# Patient Record
Sex: Female | Born: 1960 | Race: Black or African American | Hispanic: No | Marital: Single | Smoking: Never smoker
Health system: Southern US, Community
[De-identification: ages and names within clinical notes are randomized; demographics above are authoritative.]

## PROBLEM LIST (undated history)

## (undated) DIAGNOSIS — C801 Malignant (primary) neoplasm, unspecified: Secondary | ICD-10-CM

## (undated) DIAGNOSIS — Z9289 Personal history of other medical treatment: Secondary | ICD-10-CM

## (undated) DIAGNOSIS — C8590 Non-Hodgkin lymphoma, unspecified, unspecified site: Secondary | ICD-10-CM

## (undated) DIAGNOSIS — D649 Anemia, unspecified: Secondary | ICD-10-CM

## (undated) DIAGNOSIS — E559 Vitamin D deficiency, unspecified: Secondary | ICD-10-CM

## (undated) DIAGNOSIS — Q6102 Congenital multiple renal cysts: Secondary | ICD-10-CM

## (undated) DIAGNOSIS — E278 Other specified disorders of adrenal gland: Secondary | ICD-10-CM

## (undated) DIAGNOSIS — R42 Dizziness and giddiness: Secondary | ICD-10-CM

## (undated) DIAGNOSIS — Z79899 Other long term (current) drug therapy: Secondary | ICD-10-CM

## (undated) DIAGNOSIS — R0600 Dyspnea, unspecified: Secondary | ICD-10-CM

## (undated) DIAGNOSIS — C859A Non-Hodgkin lymphoma, unspecified, in remission: Secondary | ICD-10-CM

## (undated) DIAGNOSIS — C9100 Acute lymphoblastic leukemia not having achieved remission: Secondary | ICD-10-CM

## (undated) DIAGNOSIS — E213 Hyperparathyroidism, unspecified: Secondary | ICD-10-CM

## (undated) DIAGNOSIS — E042 Nontoxic multinodular goiter: Secondary | ICD-10-CM

## (undated) DIAGNOSIS — N186 End stage renal disease: Secondary | ICD-10-CM

## (undated) DIAGNOSIS — H9190 Unspecified hearing loss, unspecified ear: Secondary | ICD-10-CM

## (undated) DIAGNOSIS — E119 Type 2 diabetes mellitus without complications: Secondary | ICD-10-CM

## (undated) DIAGNOSIS — R7303 Prediabetes: Secondary | ICD-10-CM

## (undated) DIAGNOSIS — I10 Essential (primary) hypertension: Secondary | ICD-10-CM

## (undated) DIAGNOSIS — I509 Heart failure, unspecified: Secondary | ICD-10-CM

## (undated) DIAGNOSIS — N183 Chronic kidney disease, stage 3 unspecified: Secondary | ICD-10-CM

## (undated) DIAGNOSIS — E279 Disorder of adrenal gland, unspecified: Secondary | ICD-10-CM

## (undated) DIAGNOSIS — N200 Calculus of kidney: Secondary | ICD-10-CM

## (undated) DIAGNOSIS — Z7969 Long term (current) use of other immunomodulators and immunosuppressants: Secondary | ICD-10-CM

## (undated) HISTORY — PX: PARATHYROIDECTOMY: SHX19

## (undated) HISTORY — PX: ABDOMINAL HYSTERECTOMY: SHX81

## (undated) HISTORY — DX: Disorder of adrenal gland, unspecified: E27.9

## (undated) HISTORY — DX: Vitamin D deficiency, unspecified: E55.9

## (undated) HISTORY — PX: TUBAL LIGATION: SHX77

## (undated) HISTORY — PX: BREAST BIOPSY: SHX20

## (undated) HISTORY — DX: Nontoxic multinodular goiter: E04.2

## (undated) HISTORY — DX: Type 2 diabetes mellitus without complications: E11.9

## (undated) HISTORY — DX: Unspecified hearing loss, unspecified ear: H91.90

## (undated) HISTORY — DX: Calculus of kidney: N20.0

## (undated) HISTORY — DX: Dizziness and giddiness: R42

## (undated) HISTORY — DX: Congenital multiple renal cysts: Q61.02

## (undated) HISTORY — DX: Other specified disorders of adrenal gland: E27.8

## (undated) HISTORY — DX: Hyperparathyroidism, unspecified: E21.3

## (undated) HISTORY — PX: OTHER SURGICAL HISTORY: SHX169

## (undated) HISTORY — DX: Chronic kidney disease, stage 3 unspecified: N18.30

## (undated) NOTE — *Deleted (*Deleted)
Marland Kitchen    HEMATOLOGY/ONCOLOGY CLINIC NOTE  Date of Service: 01/07/2020  Patient Care Team: Bernerd Limbo, MD as PCP - General (Family Medicine)  CHIEF COMPLAINTS/PURPOSE OF CONSULTATION:  f/u for Diffuse large B-cell lymphoma.   HISTORY OF PRESENTING ILLNESS:   Tracey Stevens is a wonderful 47 y.o. 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  Patient has a history of cervical cancer diagnosed in 2003 in [place] treated with total abdominal hysterectomy. Has been in remission since. He did not require any chemoradiation. Bilateral ovaries intact per patient.  She presented to Dr. Alferd Patee MD at [facility] in October 2014 with left-sided abdominal and chest pain and was found to have abnormal liver function tests. Subsequent CT chest abdomen pelvis showed large hepatic lesions. Biopsy of one of the liver lesions in the right lobe of the liver on 12/05/2012 showed diffuse large B-cell lymphoma with a Ki-67 of more than 95%. Bone marrow biopsy was done on 12/29/2012 and was noted to be negative for lymphoma. Patient subsequently had a PET/CT scan which showed lymphoma involving the liver, spleen, mesenteric lymph nodes as well as a T12 fracture for which she was given a back brace.  Patient has been treated with 6 cycles of R CHOP for suspected double hit DL BCL and received 4 intrathecal doses of methotrexate for CNS prophylaxis. PET/CT scan done on 08/31/2013 showed no evidence of residual lymphoma. She was noted to have bilateral nodular densities in bilateral lower lungs which were thought to be due to inflammation/infection and she was empirically treated with levofloxacin for one week.  She has had issues with back pain intermittently and significant renal stones. She notes that she had her parathyroids removed for hyperparathyroidism.  She subsequently moved from [facility] to  [place] and was seen on 08/05/2015 by Dr. Marcine Matar at the [facility] in [place] and had labs including a normal LDH of 135, normal CBC and normal chemistries  on 08/05/2015. She subsequently had a CT chest abdomen pelvis on 08/16/2015 that showed no evidence of abnormal lymphadenopathy within the chest abdomen or pelvis.  Noted to have an indeterminate 1.5 cm left adrenal gland nodule. Severe remote compression deformity of T12. She was noted to have left sided nephrolithiasis and simple appearing bilateral renal cysts for which she has been validated by Dr. Festus Aloe at [facility] urology.  Patient notes no fevers no chills no drenching night sweats. No weight loss. No other specific new focal symptoms at this time.  Notes that she has had issues with chronic back pains for which she has been using a brace. She has been validated by neurosurgery Dr. Sherwood Gambler and a decision has been made against surgery at this time. Has been using naproxen for her back pain.  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  INTERVAL HISTORY: Tracey Stevens is here for her scheduled 12 month follow-up for diffuse large B-cell lymphoma. The patient's last visit with [place] was on 01/10/2019. The pt reports that she is doing well overall.  The pt reports ***  Of note since the patient's last visit, pt has had *** completed on *** with results revealing ***.  Lab results today (01/07/20) of CBC w/diff and CMP is as  follows: all values are WNL except for ***. 01/10/2020 LDH at***  On review of systems, pt reports *** and denies ***and any other symptoms.   A&P: -Discussed pt labwork today, 01/07/20; *** -***  MEDICAL HISTORY:  Past Medical History:  Diagnosis Date  . Cancer (HCC)    cervical  . Hypertension   . 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  Vertigo-likely due to either etiology Adrenal nodule Renal cysts Nephrolithiasis Hypertension T12 compression fracture Multinodular goiter Primary hyperparathyroidism status post parathyroidectomy Vitamin D deficiency Left-sided conductive hearing loss   SURGICAL HISTORY: Past Surgical History:  Procedure Laterality Date  . ABDOMINAL HYSTERECTOMY    . BREAST BIOPSY Right over 10 years ago   benign  Biopsy of liver lesion noted to be diffuse large B-cell lymphoma in 2014. Parathyroidectomy-subtotal History of left ear surgery History of chemotherapy  SOCIAL HISTORY: Social History   Socioeconomic History  . Marital status: Single    Spouse name: Not on file  . Number of children: 4  . Years of education: Not on file  . Highest education level: 12th grade  Occupational History  . Occupation: disabled  Tobacco Use  . Smoking status: Never Smoker  . Smokeless tobacco: Never Used  Vaping Use  . Vaping Use: Never used  Substance and Sexual Activity  . Alcohol use: No  . Drug use: No  . Sexual activity: Not on file  Other Topics Concern  . Not on file  Social History Narrative   Pt is right handed. She is single, lives alone in a 2nd floor apartment. She drinks 2-3 glasses of tea a day. She is active.   Social Determinants of Health   Financial Resource Strain:   . Difficulty of Paying Living Expenses: Not on file  Food Insecurity:   . Worried About Charity fundraiser in the Last Year: Not on file  . Ran Out of Food in the Last Year: Not on file  Transportation Needs:   . Lack of Transportation (Medical): Not on file  . Lack of Transportation (Non-Medical): Not on file  Physical Activity:   . Days of Exercise per Week: Not on file  . Minutes of Exercise per Session: Not on file  Stress:   .  Feeling of Stress : Not on file  Social Connections:   . Frequency of Communication with Friends and Family: Not on file  . Frequency of Social Gatherings with Friends and Family: Not on file  . Attends Religious Services: Not on file  . Active Member of Clubs or Organizations: Not on file  . Attends Archivist Meetings: Not on file  . Marital Status: Not on file  Intimate Partner Violence:   . Fear of Current or Ex-Partner: Not on file  . Emotionally Abused: Not on file  . Physically Abused: Not on file  . Sexually Abused: Not on file  Moved from [place] to [facility] about one year ago  FAMILY HISTORY:  Family history of coronary artery disease -mother  Hypertension-mother -Gastric cancer-father Renal failure father   ALLERGIES:  has No Known Allergies.  MEDICATIONS:  Current Outpatient Medications  Medication Sig Dispense Refill  . alendronate (FOSAMAX) 70 MG tablet Take 70 mg by mouth once a week. Take with a full glass of water  on an empty stomach.    Marland Kitchen atorvastatin (LIPITOR) 20 MG tablet Take 20 mg by mouth daily.    Marland Kitchen azelastine (ASTELIN) 0.1 % nasal spray Place 1 spray into both nostrils 2 (two) times daily. Use in each nostril as directed    . cholecalciferol (VITAMIN D) 1000 units tablet Take 4,000 Units by mouth daily.     . furosemide (LASIX) 20 MG tablet Take 20 mg by mouth 2 (two) times daily.    Marland Kitchen gabapentin (NEURONTIN) 300 MG capsule Take 300 mg by mouth 2 (two) times daily.    Marland Kitchen lisinopril (PRINIVIL,ZESTRIL) 10 MG tablet Take 10 mg by mouth daily.    Marland Kitchen losartan (COZAAR) 25 MG tablet Take by mouth.    . meclizine (ANTIVERT) 25 MG tablet Take 25 mg by mouth 2 (two) times daily as needed for dizziness.     No current facility-administered medications for this visit.    REVIEW OF SYSTEMS:   A 10+ POINT REVIEW OF SYSTEMS WAS OBTAINED including neurology, dermatology, psychiatry, cardiac, respiratory, lymph, extremities, GI,  GU, Musculoskeletal, constitutional, breasts, reproductive, HEENT.  All pertinent positives are noted in the HPI.  All others are negative.   PHYSICAL EXAMINATION: ECOG PERFORMANCE STATUS: 1 - Symptomatic but completely ambulatory  . There were no vitals filed for this visit. There were no vitals filed for this visit. .There is no height or weight on file to calculate BMI.  *** GENERAL:alert, in no acute distress and comfortable SKIN: no acute rashes, no significant lesions EYES: conjunctiva are pink and non-injected, sclera anicteric OROPHARYNX: MMM, no exudates, no oropharyngeal erythema or ulceration NECK: supple, no JVD LYMPH:  no palpable lymphadenopathy in the cervical, axillary or inguinal regions LUNGS: clear to auscultation b/l with normal respiratory effort HEART: regular rate & rhythm ABDOMEN:  normoactive bowel sounds , non tender, not distended. No palpable hepatosplenomegaly.  Extremity: no pedal edema PSYCH: alert & oriented x 3 with fluent speech NEURO: no focal motor/sensory deficits  LABORATORY DATA:  I have reviewed the data as listed  . CBC Latest Ref Rng & Units 01/10/2019 01/11/2018 07/13/2017  WBC 4.0 - 10.5 K/uL 4.8 5.4 3.8(L)  Hemoglobin 12.0 - 15.0 g/dL 12.5 12.7 13.2  Hematocrit 36 - 46 % 38.1 37.7 38.8  Platelets 150 - 400 K/uL 308 264 279   . CBC    Component Value Date/Time   WBC 4.8 01/10/2019 1049   RBC 4.14 01/10/2019 1049   HGB 12.5 01/10/2019 1049   HGB 12.7 01/11/2018 1304   HGB 13.1 01/05/2017 1047   HCT 38.1 01/10/2019 1049   HCT 38.9 01/05/2017 1047   PLT 308 01/10/2019 1049   PLT 264 01/11/2018 1304   PLT 279 01/05/2017 1047   MCV 92.0 01/10/2019 1049   MCV 91.1 01/05/2017 1047   MCH 30.2 01/10/2019 1049   MCHC 32.8 01/10/2019 1049   RDW 12.6 01/10/2019 1049   RDW 13.3 01/05/2017 1047   LYMPHSABS 1.9 01/10/2019 1049   LYMPHSABS 1.7 01/05/2017 1047   MONOABS 0.5 01/10/2019 1049   MONOABS 0.4 01/05/2017 1047   EOSABS 0.2  01/10/2019 1049   EOSABS 0.2 01/05/2017 1047   BASOSABS 0.0 01/10/2019 1049   BASOSABS 0.0 01/05/2017 1047    . CMP Latest Ref Rng & Units 01/10/2019 01/11/2018 07/13/2017  Glucose 70 - 99 mg/dL 114(H) 90 82  BUN 6 - 20 mg/dL 19 16 18   Creatinine 0.44 - 1.00 mg/dL 2.03(H) 1.56(H) 1.44(H)  Sodium 135 - 145 mmol/L  144 146(H) 144  Potassium 3.5 - 5.1 mmol/L 4.2 4.1 3.9  Chloride 98 - 111 mmol/L 106 106 107  CO2 22 - 32 mmol/L 30 31 29   Calcium 8.9 - 10.3 mg/dL 10.2 10.2 10.4  Total Protein 6.5 - 8.1 g/dL 7.1 7.2 7.1  Total Bilirubin 0.3 - 1.2 mg/dL 0.5 0.5 0.6  Alkaline Phos 38 - 126 U/L 72 61 49  AST 15 - 41 U/L 17 15 15   ALT 0 - 44 U/L 7 12 9    . Lab Results  Component Value Date   LDH 161 01/10/2019       CT Chest Abdomen Pelvis W Contrast6/04/2015 [facility] Result Impression  IMPRESSION: 1.No evidence of abnormal lymphadenopathy within the chest, abdomen, or pelvis.  2.Indeterminate 1.5 cm left adrenal gland nodule.  3.Severe remote compression deformity of T12 which may reflect remote history of lymphoma.  4.Hepatic cyst  5. Simple appearing bilateral renal cysts. Left-sided nephrolithiasis.  6.Small hiatal hernia.  Result Narrative  INDICATION: C83.30: Diffuse large B-cell lymphoma, unspecified site.  COMPARISON:None available.  TECHNIQUE:CT CHEST ABDOMEN PELVIS W CONTRAST -100 mL Isovue 370 were administered intravenously. Dose reduction was utilized (automated exposure control, mA or kV adjustment based on patient size, or iterative image reconstruction).  FINDINGS:   SUPPORT APPARATUS: N.A.   LUNGS/PLEURA: No focal airspace opacities/consolidations. No pneumothorax.  No abnormal pulmonary masses.  No pleural effusions.  HEART/MEDIASTINUM: Cardiac size is normal. No acute thoracic aortic abnormalities.  No hilar, mediastinal, or axillary lymphadenopathy.  SOLID ABDOMINAL VISCERA: Liver: Small hepatic cysts  measures 1.8 cm.. Gallbladder: Contracted Pancreas: Unremarkable. Adrenal glands: Indeterminate left gland nodule measuring 1.3 x 1.5 cm. Normal right adrenal gland. Spleen: Unremarkable. Kidneys: Multiple nonobstructing calculi within the left lower pole renal calyx largest measuring 8 mm. No hydronephrosis or perinephric stranding bilaterally. There are multiple bilateral renal cysts larger of which are seen on the left measuring up to  6.5 cm in size. There are smaller right renal cysts largest 1.2 cm.  GI: No bowel obstruction. Small hiatal hernia. No focal bowel wall thickening or inflammatory changes. Normal appendix.   PERITONEAL CAVITY/RETROPERITONEUM: No free fluid. No pneumoperitoneum. No lymphadenopathy.  PELVIS: No acute abnormalities.  MUSCULOSKELETAL: No acute or destructive osseous processes.  MISC./CHRONIC FINDINGS: Atherosclerotic calcifications are present, particularly involving the visualized aorta. Chronic/degenerative changes at L5-S1. Remote severe compression deformity of T12.   RADIOGRAPHIC STUDIES: I have personally reviewed the radiological images as listed and agreed with the findings in the report. No results found.   MRI Brain 05/20/17 at [facility]: #Skull/marrow/soft tissues: No calvarial or skull base lesion. #Orbits: Unremarkable. #Sinuses: Unremarkable. #Brain: Overall parenchymal volume appropriate for age. No significant signal abnormality appreciated within the brain parenchyma. In particular, no mass, hemorrhage or infarction. No significant white matter disease. Mild expanded empty sella. No  midline shift, hydrocephalus or extra-axial collections. Flow is demonstrated within the major vessels at the base of the brain.  MRI Spine Lumbar 03/18/17 at [facility]:  1. Old compression fracture at T12 2. Severe left neuroforaminal stenosis and moderate to severe right neuroforaminal stenosis at L5-S1 due  to disc bulge and facet arthritis  3. Otherwise mild degenerative changes  ASSESSMENT & PLAN:   37 y.o. African-American female with  #1 history of stage IV diffuse large B-cell lymphoma possibly double hit. Diagnosed October 2014. Patient was treated with R CHOP 6 cycles and CNS prophylaxis with intrathecal methotrexate 4 doses. She is now about 4  years out and has been in complete remission. She has no constitutional symptoms at this time or palpable lymphadenopathy or hepatosplenomegaly or other focal symptoms to suggest lymphoma recurrence at this time. Her last CT chest abdomen pelvis on 08/16/2015 were negative for evidence of lymphoma.  PLAN:  ***  #2 left-sided nephrolithiasis with renal cysts-being followed by Festus Aloe with the [facility] urology. -kidney stone is stable, her urologist is considering Cystoscopy   #3 chronic back pains due to degenerative disc disease and compression fracture at T12 from [place] with neurosurgery Dr. Sherwood Gambler. -She is now under pain management. Her naproxen was recently changed to Meloxicam in 06/2017.  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  -Seen on 05/20/17 brain MRI  -Continue to be followed by neurologist.   FOLLOW UP: ***   The total time spent in the appt was *** minutes and more than 50% was on counseling and direct patient cares.  All of the patient's questions were answered with apparent satisfaction. The patient knows to call the clinic with any problems, questions or concerns.    Sullivan Lone MD [place] AAHIVMS [facility] Hematology/Oncology Physician [facility]  (Office):       [phone] (Work cell):  [phone] (Fax):           [phone]  01/07/2020 7:01 PM  I, Yevette Edwards, am acting as a scribe for Dr. Sullivan Lone.   {Add Buyer, retail Statement}

---

## 2012-03-16 HISTORY — PX: LIVER BIOPSY: SHX301

## 2015-06-09 ENCOUNTER — Encounter (HOSPITAL_COMMUNITY): Payer: Self-pay | Admitting: *Deleted

## 2015-06-09 ENCOUNTER — Emergency Department (HOSPITAL_COMMUNITY)
Admission: EM | Admit: 2015-06-09 | Discharge: 2015-06-09 | Disposition: A | Discharge status: Home / Self Care | Payer: Medicaid Other | Attending: Emergency Medicine | Admitting: Emergency Medicine

## 2015-06-09 DIAGNOSIS — Z8541 Personal history of malignant neoplasm of cervix uteri: Secondary | ICD-10-CM | POA: Insufficient documentation

## 2015-06-09 DIAGNOSIS — I10 Essential (primary) hypertension: Secondary | ICD-10-CM | POA: Diagnosis not present

## 2015-06-09 DIAGNOSIS — C8590 Non-Hodgkin lymphoma, unspecified, unspecified site: Secondary | ICD-10-CM | POA: Insufficient documentation

## 2015-06-09 DIAGNOSIS — K029 Dental caries, unspecified: Secondary | ICD-10-CM | POA: Diagnosis not present

## 2015-06-09 DIAGNOSIS — K0889 Other specified disorders of teeth and supporting structures: Secondary | ICD-10-CM | POA: Insufficient documentation

## 2015-06-09 HISTORY — DX: Non-Hodgkin lymphoma, unspecified, unspecified site: C85.90

## 2015-06-09 HISTORY — DX: Non-Hodgkin lymphoma, unspecified, in remission: C85.9A

## 2015-06-09 HISTORY — DX: Malignant (primary) neoplasm, unspecified: C80.1

## 2015-06-09 HISTORY — DX: Essential (primary) hypertension: I10

## 2015-06-09 MED ORDER — PENICILLIN V POTASSIUM 250 MG PO TABS
500.0000 mg | ORAL_TABLET | Freq: Once | ORAL | Status: AC
  Administered 2015-06-09: 500 mg via ORAL
  Filled 2015-06-09: qty 2

## 2015-06-09 MED ORDER — BUPIVACAINE-EPINEPHRINE (PF) 0.5% -1:200000 IJ SOLN
1.8000 mL | Freq: Once | INTRAMUSCULAR | Status: AC
  Administered 2015-06-09: 1.8 mL

## 2015-06-09 MED ORDER — LIDOCAINE VISCOUS 2 % MT SOLN
15.0000 mL | Freq: Once | OROMUCOSAL | Status: AC
  Administered 2015-06-09: 15 mL via OROMUCOSAL
  Filled 2015-06-09: qty 15

## 2015-06-09 MED ORDER — OXYCODONE-ACETAMINOPHEN 5-325 MG PO TABS: ORAL_TABLET | ORAL | Status: DC

## 2015-06-09 MED ORDER — BUPIVACAINE-EPINEPHRINE (PF) 0.5% -1:200000 IJ SOLN
1.8000 mL | Freq: Once | INTRAMUSCULAR | Status: AC
  Administered 2015-06-09: 1.8 mL
  Filled 2015-06-09: qty 1.8

## 2015-06-09 MED ORDER — PENICILLIN V POTASSIUM 500 MG PO TABS: 500.0000 mg | ORAL_TABLET | Freq: Four times a day (QID) | ORAL | Status: AC

## 2015-06-09 MED ORDER — OXYCODONE-ACETAMINOPHEN 5-325 MG PO TABS
2.0000 | ORAL_TABLET | Freq: Once | ORAL | Status: AC
  Administered 2015-06-09: 2 via ORAL
  Filled 2015-06-09: qty 2

## 2015-06-09 MED ORDER — ONDANSETRON 4 MG PO TBDP
4.0000 mg | ORAL_TABLET | Freq: Once | ORAL | Status: AC
  Administered 2015-06-09: 4 mg via ORAL
  Filled 2015-06-09: qty 1

## 2015-06-09 MED ORDER — IBUPROFEN 800 MG PO TABS: 800.0000 mg | ORAL_TABLET | Freq: Three times a day (TID) | ORAL | Status: DC

## 2015-06-09 NOTE — Discharge Instructions (Signed)

## 2015-06-09 NOTE — ED Notes (Signed)
Declined W/C at D/C and was escorted to lobby by RN. 

## 2015-06-09 NOTE — ED Notes (Signed)
PY reports dental pain . Pt reports taking oxycodone ,advil and tylenol with no relief of pain.

## 2015-06-09 NOTE — ED Provider Notes (Signed)
CSN: [id number]     Arrival date & time 06/09/15  1646 History  By signing my name below, I, Tracey Stevens, attest that this documentation has been prepared under the direction and in the presence of Delsa Grana, PA-C. Electronically Signed: Randa Stevens, ED Scribe. 06/09/2015. 5:09 PM.     Chief Complaint  Patient presents with  . Dental Pain   Patient is a 55 y.o. female presenting with tooth pain. The history is provided by the patient. No language interpreter was used.  Dental Pain Associated symptoms: facial swelling   Associated symptoms: no fever    HPI Comments: Tracey Stevens is a 55 y.o. female who presents to the Emergency Department complaining of 10/10 lower right sided dental pain onset 3 days prior. Pt states that she believes that her lower right molar has a cavity in it and may be developing an abscess. Pain is radiating to the right side of her face associated with facial swelling.  No redness. Pt states she has tried Oxycodone 5-325 and motrin with no relief. Denies fever, nausea, vomiting or trouble swallowing. Pt reports Hx of lymphoma that has been in remission since 2015.  She has pain medication for chronic back pain but takes 1 to 2 at most per day.  Her last dose was early this morning.  Past Medical History  Diagnosis Date  . Cancer ([place])     cervical  . Lymphoma in remission ([place]) 2015 remission  . Hypertension    Past Surgical History  Procedure Laterality Date  . Abdominal hysterectomy     History reviewed. No pertinent family history. Social History  Substance Use Topics  . Smoking status: Never Smoker   . Smokeless tobacco: Never Used  . Alcohol Use: No   OB History    No data available     Review of Systems  Constitutional: Negative for fever.  HENT: Positive for dental problem and facial swelling. Negative for trouble swallowing.   Gastrointestinal: Negative for nausea and vomiting.  All other systems reviewed and are  negative.     Allergies  Review of patient's allergies indicates no known allergies.  Home Medications   Prior to Admission medications   Medication Sig Start Date End Date Taking? Authorizing Provider  ibuprofen (ADVIL,MOTRIN) 800 MG tablet Take 1 tablet (800 mg total) by mouth 3 (three) times daily. 06/09/15   Delsa Grana, PA-C  oxyCODONE-acetaminophen (PERCOCET/ROXICET) 5-325 MG tablet Take 1 to 2 tabs by mouth every six hours as needed for pain. 06/09/15   Delsa Grana, PA-C  penicillin v potassium (VEETID) 500 MG tablet Take 1 tablet (500 mg total) by mouth 4 (four) times daily. 06/09/15 06/16/15  Delsa Grana, PA-C   BP 147/97 mmHg  Pulse 98  Temp(Src) 99.1 F (37.3 C) (Oral)  Resp 20  Ht 6\' 1"  (1.854 m)  Wt 104.327 kg  BMI 30.35 kg/m2  SpO2 98%   Physical Exam  Constitutional: She is oriented to person, place, and time. She appears well-developed and well-nourished. She is cooperative.  Non-toxic appearance. She does not have a sickly appearance. She appears distressed.  Non-toxic appearing female, appears very uncomfortable, unable to hold still, holding right side of face, rocking back and forth and moaning  HENT:  Head: Normocephalic and atraumatic.  Right Ear: Tympanic membrane and external ear normal.  Left Ear: Tympanic membrane and external ear normal.  Nose: Nose normal. Right sinus exhibits no maxillary sinus tenderness and no frontal sinus tenderness. Left sinus exhibits no  maxillary sinus tenderness and no frontal sinus tenderness.  Mouth/Throat: Uvula is midline, oropharynx is clear and moist and mucous membranes are normal. She has dentures. No trismus in the jaw. Abnormal dentition. Dental caries present. No dental abscesses or uvula swelling.    No sublingual tenderness  Eyes: Conjunctivae and EOM are normal.  Neck: Neck supple.  Cardiovascular: Normal rate.   Pulmonary/Chest: Effort normal. No respiratory distress.  Musculoskeletal: Normal range of motion.   Neurological: She is alert and oriented to person, place, and time.  Skin: Skin is warm and dry. She is not diaphoretic.  Psychiatric: She has a normal mood and affect. Her behavior is normal.  Nursing note and vitals reviewed.   ED Course  Procedures (including critical care time)  NERVE BLOCK Date/Time: 06/09/2015, 1800 Performed by: Dala Dock Authorized by: Delsa Grana R Consent: Verbal consent obtained. Risks and benefits: risks, benefits and alternatives were discussed Consent given by: patient Indications: pain relief Body area: tooth # 32 Laterality:right lower posterior molar Needle gauge: 27 G Topical anesthetic:  Viscous lidocaine Local anesthetic: bupivacaine  Anesthetic total: 1.8 ml x 2 Outcome: pain improved Patient tolerance: Patient tolerated the procedure well with no immediate complications. Comments: Inferior alveolar nerve block gave partial relief to pt, decreasing pain from 10/10 to 7/10.  Right inferior buccal block gave complete relief of pain.     Dental Soft Tissue Exam  DIAGNOSTIC STUDIES: Oxygen Saturation is 99% on RA, normal by my interpretation.    COORDINATION OF CARE: 5:07 PM-Discussed treatment plan with pt at bedside and pt agreed to plan.     Labs Review Labs Reviewed - No data to display  Imaging Review No results found.    EKG Interpretation None      MDM   Patient with toothache.  No gross abscess.  Exam unconcerning for Ludwig's angina or spread of infection.  Will treat with penicillin and pain medicine.  Dental/nerve block performed due to pt's severe pain and current daily use of percocet.  Urged patient to follow-up with dentist.    Pt d/c in good condition.  Return precautions reviewed.  VSS  Final diagnoses:  Pain, dental    I personally performed the services described in this documentation, which was scribed in my presence. The recorded information has been reviewed and is accurate.       Delsa Grana, PA-C 06/15/15 [place], MD 06/20/15 [phone]

## 2015-07-12 ENCOUNTER — Encounter (HOSPITAL_COMMUNITY): Payer: Self-pay | Admitting: Emergency Medicine

## 2015-07-12 ENCOUNTER — Emergency Department (HOSPITAL_COMMUNITY)
Admission: EM | Admit: 2015-07-12 | Discharge: 2015-07-12 | Disposition: A | Discharge status: Home / Self Care | Payer: Medicaid Other | Attending: Emergency Medicine | Admitting: Emergency Medicine

## 2015-07-12 DIAGNOSIS — K029 Dental caries, unspecified: Secondary | ICD-10-CM | POA: Diagnosis not present

## 2015-07-12 DIAGNOSIS — I10 Essential (primary) hypertension: Secondary | ICD-10-CM | POA: Diagnosis not present

## 2015-07-12 DIAGNOSIS — Z791 Long term (current) use of non-steroidal anti-inflammatories (NSAID): Secondary | ICD-10-CM | POA: Diagnosis not present

## 2015-07-12 DIAGNOSIS — Z8572 Personal history of non-Hodgkin lymphomas: Secondary | ICD-10-CM | POA: Diagnosis not present

## 2015-07-12 DIAGNOSIS — Z8541 Personal history of malignant neoplasm of cervix uteri: Secondary | ICD-10-CM | POA: Insufficient documentation

## 2015-07-12 DIAGNOSIS — K0889 Other specified disorders of teeth and supporting structures: Secondary | ICD-10-CM | POA: Diagnosis present

## 2015-07-12 MED ORDER — AMOXICILLIN-POT CLAVULANATE 875-125 MG PO TABS
1.0000 | ORAL_TABLET | Freq: Once | ORAL | Status: AC
  Administered 2015-07-12: 1 via ORAL
  Filled 2015-07-12: qty 1

## 2015-07-12 MED ORDER — IBUPROFEN 800 MG PO TABS: 800.0000 mg | ORAL_TABLET | Freq: Three times a day (TID) | ORAL | Status: DC

## 2015-07-12 MED ORDER — KETOROLAC TROMETHAMINE 60 MG/2ML IM SOLN
30.0000 mg | Freq: Once | INTRAMUSCULAR | Status: AC
  Administered 2015-07-12: 30 mg via INTRAMUSCULAR
  Filled 2015-07-12: qty 2

## 2015-07-12 MED ORDER — AMOXICILLIN-POT CLAVULANATE 875-125 MG PO TABS: 1.0000 | ORAL_TABLET | Freq: Two times a day (BID) | ORAL | Status: DC

## 2015-07-12 NOTE — ED Notes (Signed)
Pt. States she has right sided dental pain x 1 month on and off. Pt. States she was seen for same dental pain 2 weeks ago and pain went away for a while. Pt. States this morning she woke up with swelling in right side of mouth and severe pain in right side of mouth.

## 2015-07-12 NOTE — Discharge Instructions (Signed)
[facility] [employer] is a great source of information about community services available.  Access by dialing 2-1-1 from anywhere in [place], or by website -  CustodianSupply.fi.   Other Local Resources (Updated 03/2015)  Dental  Care   Services    Phone Number and Address  Cost  [facility] For children 62 - 56 years of age:   Cleaning  Tooth brushing/flossing instruction  Sealants, fillings, crowns  Extractions  Emergency treatment  [phone] [address], [place] [address] Charges based on family income.  Medicaid and some insurance plans accepted.     [facility] - [facility], fillings, crowns  Extractions  Emergency treatment [phone] [address] [place], [place]  Pregnant women 39 years of age or older with a Medicaid card  [facility] - [place]  Cleaning  Sealants, fillings, crowns  Extractions  Emergency treatment [phone] [address] [place], [place] Pregnant women 73 years of age or older with a Medicaid card  [facility] For children 43 - 33 years of age:   Cleaning  Tooth brushing/flossing instruction  Sealants, fillings, crowns  Extractions  Emergency treatment Limited orthodontic services for patients with Medicaid [phone], [place] [address] Medicaid and [facility] Health Choice cover for children up to age 71 and pregnant women.  Parents of children up to age 47 without Medicaid pay a reduced fee at time of service.  [place] For children 88 - 33 years of age:   Cleaning  Tooth brushing/flossing instruction  Sealants, fillings, crowns  Extractions  Emergency treatment Limited orthodontic services for patients with Medicaid  [phone] [place], [place].  Medicaid and [place] Health Choice cover for children up to age 26 and pregnant women.  Parents of children up to age 42 without Medicaid pay a reduced fee.  [facility]  Sealants, fillings, crowns  Extractions  Hours: Tuesdays and Thursdays, 4:15 - 8 pm [phone] [address] [place], [place] [address] Services free of charge to [facility] residents ages 18-64 who do not have health insurance, Medicare, [place], or [place] benefits and fall within federal poverty guidelines  [place] care in addition to primary medical care, nutritional counseling, and pharmacy:  Engineer, drilling, fillings, crowns  Extractions                  [phone] [facility] [place], [place], [place], [place], [place], [place] [facility], [place], [place] [facility] [place], [place], [place], most insurance.  Also provides services available to all with fees adjusted based on ability to pay.    [facility]  Cleaning  Tooth brushing/flossing instruction  Sealants, fillings, crowns  Extractions  Emergency treatment Hours: Tuesdays, Thursdays, and Fridays from 8 am to 5 pm by appointment only. [phone] [place], [place] [address] [facility] residents with Medicaid (depending on eligibility) and children with [facility] Health Choice - call for more information.  [facility]  Extractions only  Hours: 2nd and 4th Thursday of each month from 6:30 am - 9 am.   [phone] ext. [place], [place] [address] Ages 40 and older only.  Patients are seen on a first come, first served basis.  [employer]  [employer]  Extractions  Orthodontics  Endodontics  Implants/Crowns/Bridges  Complete and partial dentures [phone] [place], [place] Patients must complete an application for services.  There is often a waiting list.

## 2015-07-12 NOTE — ED Provider Notes (Signed)
CSN: [id number]     Arrival date & time 07/12/15  1951 History  By signing my name below, I, Rayna Sexton, attest that this documentation has been prepared under the direction and in the presence of Dickey Caamano Y Ottis Vacha, [place]. Electronically Signed: Rayna Sexton, ED Scribe. 07/12/2015. 8:42 PM.   Chief Complaint  Patient presents with  . Dental Pain   The history is provided by the patient. No language interpreter was used.   HPI Comments: Tracey Stevens is a 55 y.o. female who presents to the Emergency Department complaining of waxing and waning, moderate, right sided dental pain x 1 month which worsened beginning this morning. Pt states that she was seen last month, received a dental block and was d/c with penicillin, oxycodone, ibuprofen and a dental referral that she was never able to contact. Pt then spoke to an oral surgeon and has an appointment on 07/29/2015 for an extraction of the affected tooth. She has taken oxycodone which provides short term relief of her symptoms. She reports associated, mild, chills and facial swelling. She denies difficulty swallowing, fevers or any other associated symptoms at this time.   Past Medical History  Diagnosis Date  . Cancer ([place])     cervical  . Lymphoma in remission ([place]) 2015 remission  . Hypertension    Past Surgical History  Procedure Laterality Date  . Abdominal hysterectomy     No family history on file. Social History  Substance Use Topics  . Smoking status: Never Smoker   . Smokeless tobacco: Never Used  . Alcohol Use: No   OB History    No data available     Review of Systems A complete 10 system review of systems was obtained and all systems are negative except as noted in the HPI and PMH.   Allergies  Review of patient's allergies indicates no known allergies.  Home Medications   Prior to Admission medications   Medication Sig Start Date End Date Taking? Authorizing Provider  ibuprofen (ADVIL,MOTRIN) 800 MG tablet Take 1  tablet (800 mg total) by mouth 3 (three) times daily. 06/09/15   Delsa Grana, PA-C  oxyCODONE-acetaminophen (PERCOCET/ROXICET) 5-325 MG tablet Take 1 to 2 tabs by mouth every six hours as needed for pain. 06/09/15   Delsa Grana, PA-C   BP 159/102 mmHg  Pulse 93  Temp(Src) 98.3 F (36.8 C) (Oral)  Resp 14  SpO2 98%   Physical Exam  Constitutional: She is oriented to person, place, and time. She appears well-developed and well-nourished.  HENT:  Head: Normocephalic and atraumatic.  Right mandibular edema with no overlying erythema. Mild diffuse TTP. No trismus.  Pt has carious lower right molar with surrounding gingival inflammation. No abscess. Uvula midline.  Eyes: EOM are normal.  Neck: Normal range of motion.  Cardiovascular: Normal rate.   Pulmonary/Chest: Effort normal. No respiratory distress.  Abdominal: Soft.  Musculoskeletal: Normal range of motion.  Neurological: She is alert and oriented to person, place, and time.  Skin: Skin is warm and dry.  Psychiatric: She has a normal mood and affect.  Nursing note and vitals reviewed.  ED Course  Procedures   NERVE BLOCK Performed by: Delrae Rend Consent: Verbal consent obtained. Required items: required blood products, implants, devices, and special equipment available Time out: Immediately prior to procedure a "time out" was called to verify the correct patient, procedure, equipment, support staff and site/side marked as required.  Indication: dental pain Nerve block body site: right inferior alveolar nerve  Preparation:  Patient was prepped and draped in the usual sterile fashion. Needle gauge: 24 G Location technique: anatomical landmarks  Local anesthetic: marcaine without epi  Anesthetic total: 1.7 ml  Outcome: pain improved Patient tolerance: Patient tolerated the procedure well with no immediate complications.   DIAGNOSTIC STUDIES: Oxygen Saturation is 98% on RA, normal by my interpretation.    COORDINATION  OF CARE: 8:14 PM Discussed next steps with pt. She verbalized understanding and is agreeable with the plan.   Labs Review Labs Reviewed - No data to display  Imaging Review No results found.   EKG Interpretation None      MDM   Final diagnoses:  Pain due to dental caries    Pt with increased swelling and pain from decaying tooth that will require extraction. She has OMFS appt in two weeks. No trismus, fever, dysphagia. Pain improved with inferior alveolar nerve block and toradol. rx given for augmentin. Instructed to call OMFS to see if they have any openings earlier in the month. ER return precautions given.   I personally performed the services described in this documentation, which was scribed in my presence. The recorded information has been reviewed and is accurate.   Anne Ng, PA-C 07/12/15 2140

## 2015-09-26 NOTE — ED Provider Notes (Signed)
Medical screening examination/treatment/procedure(s) were performed by non-physician practitioner and as supervising physician I was immediately available for consultation/collaboration.   EKG Interpretation None       Isla Pence, MD 09/26/15 [phone]

## 2015-10-14 DIAGNOSIS — R42 Dizziness and giddiness: Secondary | ICD-10-CM | POA: Insufficient documentation

## 2015-10-17 ENCOUNTER — Other Ambulatory Visit: Payer: Self-pay | Admitting: Nurse Practitioner

## 2015-10-17 DIAGNOSIS — N200 Calculus of kidney: Secondary | ICD-10-CM | POA: Diagnosis not present

## 2015-10-17 DIAGNOSIS — R300 Dysuria: Secondary | ICD-10-CM | POA: Diagnosis not present

## 2015-10-17 DIAGNOSIS — Z1231 Encounter for screening mammogram for malignant neoplasm of breast: Secondary | ICD-10-CM

## 2015-10-25 DIAGNOSIS — M5126 Other intervertebral disc displacement, lumbar region: Secondary | ICD-10-CM | POA: Diagnosis not present

## 2015-10-25 DIAGNOSIS — M47816 Spondylosis without myelopathy or radiculopathy, lumbar region: Secondary | ICD-10-CM | POA: Diagnosis not present

## 2015-10-25 DIAGNOSIS — M4316 Spondylolisthesis, lumbar region: Secondary | ICD-10-CM | POA: Diagnosis not present

## 2015-10-25 DIAGNOSIS — M5136 Other intervertebral disc degeneration, lumbar region: Secondary | ICD-10-CM | POA: Diagnosis not present

## 2015-10-29 ENCOUNTER — Ambulatory Visit
Admission: RE | Admit: 2015-10-29 | Discharge: 2015-10-29 | Disposition: A | Discharge status: Home / Self Care | Payer: Medicaid Other | Source: Ambulatory Visit | Attending: Nurse Practitioner | Admitting: Nurse Practitioner

## 2015-10-29 DIAGNOSIS — Z1231 Encounter for screening mammogram for malignant neoplasm of breast: Secondary | ICD-10-CM

## 2015-12-06 ENCOUNTER — Telehealth: Payer: Self-pay | Admitting: Hematology

## 2015-12-06 ENCOUNTER — Encounter: Payer: Self-pay | Admitting: Hematology

## 2015-12-06 NOTE — Telephone Encounter (Signed)
Pt confirmed appt, complete intake, mailed pt letter, faxed referring provider appt date/time

## 2015-12-09 DIAGNOSIS — E892 Postprocedural hypoparathyroidism: Secondary | ICD-10-CM | POA: Diagnosis not present

## 2015-12-09 DIAGNOSIS — H9192 Unspecified hearing loss, left ear: Secondary | ICD-10-CM | POA: Insufficient documentation

## 2015-12-09 DIAGNOSIS — Z23 Encounter for immunization: Secondary | ICD-10-CM | POA: Diagnosis not present

## 2015-12-09 DIAGNOSIS — H9202 Otalgia, left ear: Secondary | ICD-10-CM | POA: Diagnosis not present

## 2015-12-09 DIAGNOSIS — Z760 Encounter for issue of repeat prescription: Secondary | ICD-10-CM | POA: Diagnosis not present

## 2015-12-09 DIAGNOSIS — I10 Essential (primary) hypertension: Secondary | ICD-10-CM | POA: Diagnosis not present

## 2016-01-06 ENCOUNTER — Encounter: Payer: Self-pay | Admitting: Hematology

## 2016-01-06 ENCOUNTER — Telehealth: Payer: Self-pay | Admitting: Hematology

## 2016-01-06 ENCOUNTER — Ambulatory Visit (HOSPITAL_BASED_OUTPATIENT_CLINIC_OR_DEPARTMENT_OTHER): Payer: Medicare HMO

## 2016-01-06 ENCOUNTER — Ambulatory Visit (HOSPITAL_BASED_OUTPATIENT_CLINIC_OR_DEPARTMENT_OTHER): Payer: Medicare HMO | Admitting: Hematology

## 2016-01-06 VITALS — BP 141/79 | HR 95 | Temp 97.5°F | Resp 18 | Ht 73.0 in | Wt 291.7 lb

## 2016-01-06 DIAGNOSIS — H8149 Vertigo of central origin, unspecified ear: Secondary | ICD-10-CM

## 2016-01-06 DIAGNOSIS — G8929 Other chronic pain: Secondary | ICD-10-CM

## 2016-01-06 DIAGNOSIS — M5489 Other dorsalgia: Secondary | ICD-10-CM | POA: Diagnosis not present

## 2016-01-06 DIAGNOSIS — Z8572 Personal history of non-Hodgkin lymphomas: Secondary | ICD-10-CM | POA: Diagnosis not present

## 2016-01-06 DIAGNOSIS — N281 Cyst of kidney, acquired: Secondary | ICD-10-CM

## 2016-01-06 DIAGNOSIS — C8338 Diffuse large B-cell lymphoma, lymph nodes of multiple sites: Secondary | ICD-10-CM

## 2016-01-06 LAB — CBC & DIFF AND RETIC
BASO%: 0.5 % (ref 0.0–2.0)
BASOS ABS: 0 10*3/uL (ref 0.0–0.1)
EOS ABS: 0.2 10*3/uL (ref 0.0–0.5)
EOS%: 5.3 % (ref 0.0–7.0)
HCT: 39.4 % (ref 34.8–46.6)
HEMOGLOBIN: 13.5 g/dL (ref 11.6–15.9)
IMMATURE RETIC FRACT: 3.9 % (ref 1.60–10.00)
LYMPH#: 1.3 10*3/uL (ref 0.9–3.3)
LYMPH%: 33.3 % (ref 14.0–49.7)
MCH: 30.2 pg (ref 25.1–34.0)
MCHC: 34.3 g/dL (ref 31.5–36.0)
MCV: 88.1 fL (ref 79.5–101.0)
MONO#: 0.5 10*3/uL (ref 0.1–0.9)
MONO%: 11.3 % (ref 0.0–14.0)
NEUT#: 2 10*3/uL (ref 1.5–6.5)
NEUT%: 49.6 % (ref 38.4–76.8)
PLATELETS: 309 10*3/uL (ref 145–400)
RBC: 4.47 10*6/uL (ref 3.70–5.45)
RDW: 13.3 % (ref 11.2–14.5)
RETIC CT ABS: 75.99 10*3/uL (ref 33.70–90.70)
Retic %: 1.7 % (ref 0.70–2.10)
WBC: 4 10*3/uL (ref 3.9–10.3)

## 2016-01-06 LAB — COMPREHENSIVE METABOLIC PANEL
ALBUMIN: 3.8 g/dL (ref 3.5–5.0)
ALK PHOS: 74 U/L (ref 40–150)
ALT: 19 U/L (ref 0–55)
ANION GAP: 9 meq/L (ref 3–11)
AST: 20 U/L (ref 5–34)
BILIRUBIN TOTAL: 0.6 mg/dL (ref 0.20–1.20)
BUN: 13.9 mg/dL (ref 7.0–26.0)
CO2: 27 meq/L (ref 22–29)
CREATININE: 0.9 mg/dL (ref 0.6–1.1)
Calcium: 10.2 mg/dL (ref 8.4–10.4)
Chloride: 108 mEq/L (ref 98–109)
EGFR: 83 mL/min/{1.73_m2} — AB (ref >90)
GLUCOSE: 88 mg/dL (ref 70–140)
Potassium: 4 mEq/L (ref 3.5–5.1)
Sodium: 144 mEq/L (ref 136–145)
TOTAL PROTEIN: 7.8 g/dL (ref 6.4–8.3)

## 2016-01-06 LAB — LACTATE DEHYDROGENASE: LDH: 173 U/L (ref 125–245)

## 2016-01-06 NOTE — Progress Notes (Signed)
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   HISTORY OF PRESENTING ILLNESS:   Tracey Stevens is a wonderful 55 y.o. 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  Patient has a history of cervical cancer diagnosed in 2003 in [place] treated with total abdominal hysterectomy. Has been in remission since. He did not require any chemoradiation. Bilateral ovaries intact per patient.  She presented to Dr. Alferd Patee MD at [facility] in October 2014 with left-sided abdominal and chest pain and was found to have abnormal liver function tests. Subsequent CT chest abdomen pelvis showed large hepatic lesions. Biopsy of one of the liver lesions in the right lobe of the liver on 12/05/2012 showed diffuse large B-cell lymphoma with a Ki-67 of more than 95%. Bone marrow biopsy was done on 12/29/2012 and was noted to be negative for lymphoma. Patient subsequently had a PET/CT scan which showed lymphoma involving the liver, spleen, mesenteric lymph nodes as well as a T12 fracture for which she was given a back brace.  Patient has been treated with 6 cycles of R CHOP for suspected double hit DL BCL and received 4 intrathecal doses of methotrexate for CNS prophylaxis. PET/CT scan done on 08/31/2013 showed no evidence of residual lymphoma. She was noted to have bilateral nodular densities in bilateral lower lungs which were thought to be due to inflammation/infection and she was empirically treated with levofloxacin for one week.  She has had issues with back pain intermittently and significant renal stones. She notes that she had her parathyroids removed for  hyperparathyroidism.  She subsequently moved from [facility] to [place] and was seen on 08/05/2015 by Dr. Marcine Matar at the [facility] in [place] and had labs including a normal LDH of 135, normal CBC and normal chemistries  on 08/05/2015. She subsequently had a CT chest abdomen pelvis on 08/16/2015 that showed no evidence of abnormal lymphadenopathy within the chest abdomen or pelvis.  Noted to have an indeterminate 1.5 cm left adrenal gland nodule. Severe remote compression deformity of T12. She was noted to have left sided nephrolithiasis and simple appearing bilateral renal cysts for which she has been validated by [facility]. Festus Aloe at [facility] urology.  Patient notes no fevers no chills no drenching night sweats. No weight loss. No other specific new focal symptoms at this time.  Notes that she has had issues with chronic back pains for which she has been using a brace. She has been validated by neurosurgery Dr. Sherwood Gambler and a decision has been made against surgery at this time. Has been using naproxen for her back pain.  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  MEDICAL HISTORY:  Past Medical History:  Diagnosis Date  . Cancer (H[facility])    cervical  . Hypertension   . 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  Vertigo-likely due to either etiology Adrenal  nodule Renal cysts Nephrolithiasis Hypertension T12 compression fracture Multinodular goiter Primary hyperparathyroidism status post parathyroidectomy Vitamin D deficiency Left-sided conductive hearing loss   SURGICAL HISTORY: Past Surgical History:  Procedure Laterality Date  . ABDOMINAL HYSTERECTOMY      Biopsy of liver lesion noted to be diffuse large B-cell lymphoma in 2014. Parathyroidectomy-subtotal History of left ear surgery History of chemotherapy  SOCIAL HISTORY: Social History   Social History  . Marital status: Legally Separated    Spouse name: N/A  . Number of children: N/A  . Years of education: N/A   Occupational History  . Not on file.   Social History Main Topics  . Smoking status: Never Smoker  . Smokeless tobacco: Never Used  . Alcohol use No  . Drug use: No  . Sexual activity: Not on file   Other Topics Concern  . Not on file   Social History Narrative  . No narrative on file  Moved from [place] to [facility] about one year ago  FAMILY HISTORY:  Family history of coronary artery disease -mother  Hypertension-mother -Gastric cancer-father Renal failure father   ALLERGIES:  has No Known Allergies.  MEDICATIONS:  Current Outpatient Prescriptions  Medication Sig Dispense Refill  . amLODipine (NORVASC) 10 MG tablet Take by mouth.    . cetirizine (ZYRTEC) 10 MG tablet Take by mouth.    . naproxen (NAPROSYN) 500 MG tablet TAKE 1 TABLET PO BID    . meclizine (ANTIVERT) 25 MG tablet   2   No current facility-administered medications for this visit.     REVIEW OF SYSTEMS:    10 Point review of Systems was done is negative except as noted above.  PHYSICAL EXAMINATION: ECOG PERFORMANCE STATUS: 1 - Symptomatic but completely ambulatory  . Vitals:   01/06/16 1044  BP: (!) 141/79  Pulse: 95  Resp: 18  Temp: 97.5 F (36.4 C)   Filed Weights   01/06/16 1044  Weight: 291 lb 11.2 oz (132.3 kg)   .Body mass index is 38.49 kg/m.  GENERAL:alert, in no acute distress and comfortable SKIN: skin color, texture, turgor are normal, no rashes or significant lesions EYES: normal, conjunctiva are pink and non-injected, sclera clear OROPHARYNX:no exudate, no erythema and lips, buccal mucosa, and tongue normal  NECK:  supple, no JVD, thyroid normal size, non-tender, without nodularity LYMPH:  no palpable lymphadenopathy in the cervical, axillary or inguinal LUNGS: clear to auscultation with normal respiratory effort HEART: regular rate & rhythm,  no murmurs and no lower extremity edema ABDOMEN: abdomen soft, non-tender, normoactive bowel sounds , No palpable hepatosplenomegaly  Musculoskeletal: no cyanosis of digits and no clubbing , wearing a back brace  PSYCH: alert & oriented x 3 with fluent speech NEURO: no focal motor/sensory deficits  LABORATORY DATA:  I have reviewed the data as listed  . CBC Latest Ref Rng & Units 01/06/2016  WBC 3.9 - 10.3 10e3/uL 4.0  Hemoglobin 11.6 - 15.9 g/dL 13.5  Hematocrit 34.8 - 46.6 % 39.4  Platelets 145 - 400 10e3/uL 309    .No flowsheet data found.  CT Chest Abdomen Pelvis W Contrast6/04/2015 [facility] Result Impression  IMPRESSION: 1.No evidence of abnormal lymphadenopathy within the chest, abdomen, or pelvis.  2.Indeterminate 1.5 cm left adrenal gland nodule.  3.Severe remote compression deformity of T12 which may reflect remote history of lymphoma.  4.Hepatic cyst  5. Simple appearing bilateral renal cysts. Left-sided nephrolithiasis.  6.Small hiatal hernia.  Result Narrative  INDICATION: C83.30:  Diffuse large B-cell lymphoma, unspecified site.  COMPARISON:None available.  TECHNIQUE:CT CHEST ABDOMEN PELVIS W CONTRAST -100 mL Isovue 370 were administered intravenously. Dose reduction was utilized (automated exposure control, mA or kV adjustment based on patient size, or iterative image reconstruction).  FINDINGS:   SUPPORT APPARATUS: N.A.   LUNGS/PLEURA: No focal airspace opacities/consolidations. No pneumothorax.  No abnormal pulmonary masses.  No pleural effusions.  HEART/MEDIASTINUM: Cardiac size is normal. No acute thoracic aortic abnormalities.  No hilar, mediastinal, or axillary  lymphadenopathy.  SOLID ABDOMINAL VISCERA: Liver: Small hepatic cysts measures 1.8 cm.. Gallbladder: Contracted Pancreas: Unremarkable. Adrenal glands: Indeterminate left gland nodule measuring 1.3 x 1.5 cm. Normal right adrenal gland. Spleen: Unremarkable. Kidneys: Multiple nonobstructing calculi within the left lower pole renal calyx largest measuring 8 mm. No hydronephrosis or perinephric stranding bilaterally. There are multiple bilateral renal cysts larger of which are seen on the left measuring up to  6.5 cm in size. There are smaller right renal cysts largest 1.2 cm.  GI: No bowel obstruction. Small hiatal hernia. No focal bowel wall thickening or inflammatory changes. Normal appendix.   PERITONEAL CAVITY/RETROPERITONEUM: No free fluid. No pneumoperitoneum. No lymphadenopathy.  PELVIS: No acute abnormalities.  MUSCULOSKELETAL: No acute or destructive osseous processes.  MISC./CHRONIC FINDINGS: Atherosclerotic calcifications are present, particularly involving the visualized aorta. Chronic/degenerative changes at L5-S1. Remote severe compression deformity of T12.   RADIOGRAPHIC STUDIES: I have personally reviewed the radiological images as listed and agreed with the findings in the report. No results found.  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 Diagnosis October 2014. Patient was treated with R CHOP 6 cycles and CNS prophylaxis with intrathecal methotrexate 4 doses. She is now about 3 years out and has been in complete remission. She has no constitutional symptoms at this time or palpable lymphadenopathy or hepatosplenomegaly or other focal symptoms to suggest lymphoma recurrence at this time. Her last CT chest abdomen pelvis on 08/16/2015 were negative for evidence of lymphoma. Plan -We shall get a repeat CBC, CMP, LDH today. -And less any concerning findings  on labs or patient has new concerning symptoms would hold off on additional scheduled imaging at this time. -We discussed that the risk of relapse with lymphoma is the highest in the first 18-24 months and then tends to reduce to less than 10%. -She was counseled on concerning red signs to look out for an call us with.  #2 left-sided nephrolithiasis with renal cysts-being followed by Festus Aloe with the [facility] urology.  #3 chronic back pains due to degenerative disc disease and compression fracture at T12 from lymphoma. -Follows with neurosurgery Dr. Sherwood Gambler. -Has been using a back brace at this time.  #4 vertigo likely due to your etiology on meclizine when necessary. Has been scheduled for home physical therapy.  Continue follow-up with primary care physician as per the recommendations .  Return to care with Dr. Irene Limbo in 6 months with repeat CBC, CMP, LDH .currently if any new questions or concerns arise or if elevated LDH or abnormal labs from today .  All of the patients questions were answered with apparent satisfaction. The patient knows to call the clinic with any problems, questions or concerns.  I spent 50 minutes counseling the patient face to face. The total time spent in the appointment was 60 minutes and more than 50% was on counseling and direct patient cares and significant time spent reviewing bunch of outside  records.    Sullivan Lone MD [place] A[facility]IVMS [facility] Hematology/Oncology Physician [facility]  (Office):       [phone] (Work cell):  [phone] (Fax):           [phone]  01/06/2016 11:37 AM

## 2016-01-06 NOTE — Telephone Encounter (Signed)
Pt confirmed appt, received avs °

## 2016-07-06 ENCOUNTER — Telehealth: Payer: Self-pay | Admitting: Hematology

## 2016-07-06 ENCOUNTER — Other Ambulatory Visit (HOSPITAL_BASED_OUTPATIENT_CLINIC_OR_DEPARTMENT_OTHER): Payer: Medicare HMO

## 2016-07-06 ENCOUNTER — Ambulatory Visit (HOSPITAL_BASED_OUTPATIENT_CLINIC_OR_DEPARTMENT_OTHER): Payer: Medicare HMO | Admitting: Hematology

## 2016-07-06 ENCOUNTER — Encounter: Payer: Self-pay | Admitting: Hematology

## 2016-07-06 VITALS — BP 125/74 | HR 76 | Temp 98.4°F | Resp 18 | Wt 297.5 lb

## 2016-07-06 DIAGNOSIS — Z8541 Personal history of malignant neoplasm of cervix uteri: Secondary | ICD-10-CM

## 2016-07-06 DIAGNOSIS — Z8572 Personal history of non-Hodgkin lymphomas: Secondary | ICD-10-CM

## 2016-07-06 DIAGNOSIS — G8929 Other chronic pain: Secondary | ICD-10-CM

## 2016-07-06 DIAGNOSIS — M549 Dorsalgia, unspecified: Secondary | ICD-10-CM | POA: Diagnosis not present

## 2016-07-06 DIAGNOSIS — C8338 Diffuse large B-cell lymphoma, lymph nodes of multiple sites: Secondary | ICD-10-CM

## 2016-07-06 LAB — CBC & DIFF AND RETIC
BASO%: 0.5 % (ref 0.0–2.0)
Basophils Absolute: 0 10*3/uL (ref 0.0–0.1)
EOS%: 2.9 % (ref 0.0–7.0)
Eosinophils Absolute: 0.1 10*3/uL (ref 0.0–0.5)
HCT: 39.1 % (ref 34.8–46.6)
HGB: 13.6 g/dL (ref 11.6–15.9)
Immature Retic Fract: 2.8 % (ref 1.60–10.00)
LYMPH%: 53.3 % — AB (ref 14.0–49.7)
MCH: 30.6 pg (ref 25.1–34.0)
MCHC: 34.8 g/dL (ref 31.5–36.0)
MCV: 87.9 fL (ref 79.5–101.0)
MONO#: 0.3 10*3/uL (ref 0.1–0.9)
MONO%: 8.3 % (ref 0.0–14.0)
NEUT#: 1.4 10*3/uL — ABNORMAL LOW (ref 1.5–6.5)
NEUT%: 35 % — AB (ref 38.4–76.8)
PLATELETS: 312 10*3/uL (ref 145–400)
RBC: 4.45 10*6/uL (ref 3.70–5.45)
RDW: 12.7 % (ref 11.2–14.5)
Retic %: 1.31 % (ref 0.70–2.10)
Retic Ct Abs: 58.3 10*3/uL (ref 33.70–90.70)
WBC: 4.1 10*3/uL (ref 3.9–10.3)
lymph#: 2.2 10*3/uL (ref 0.9–3.3)

## 2016-07-06 LAB — COMPREHENSIVE METABOLIC PANEL
ALT: 13 U/L (ref 0–55)
ANION GAP: 10 meq/L (ref 3–11)
AST: 16 U/L (ref 5–34)
Albumin: 3.9 g/dL (ref 3.5–5.0)
Alkaline Phosphatase: 69 U/L (ref 40–150)
BUN: 12.4 mg/dL (ref 7.0–26.0)
CHLORIDE: 105 meq/L (ref 98–109)
CO2: 30 meq/L — AB (ref 22–29)
Calcium: 10.3 mg/dL (ref 8.4–10.4)
Creatinine: 1 mg/dL (ref 0.6–1.1)
EGFR: 74 mL/min/{1.73_m2} — AB (ref >90)
Glucose: 99 mg/dl (ref 70–140)
Potassium: 3.5 mEq/L (ref 3.5–5.1)
Sodium: 145 mEq/L (ref 136–145)
Total Bilirubin: 0.66 mg/dL (ref 0.20–1.20)
Total Protein: 7.4 g/dL (ref 6.4–8.3)

## 2016-07-06 LAB — LACTATE DEHYDROGENASE: LDH: 152 U/L (ref 125–245)

## 2016-07-06 NOTE — Telephone Encounter (Signed)
Appointments scheduled per 07/06/16 los. Patient was given a copy of the AVS report and appointment schedule per 07/06/16 los.  °

## 2016-07-06 NOTE — Progress Notes (Signed)
Marland Kitchen    HEMATOLOGY/ONCOLOGY CLINIC NOTE  Date of Service: 07/06/2016  Patient Care Team: Barrie Lyme, FNP as PCP - General (Nurse Practitioner)  CHIEF CMPLAINTS/PURPOSE OF CONSULTATION:  f/u forf Diffuse large B-cell lymphoma.   HISTORY OF PRESENTING ILLNESS:   Tracey Stevens is a wonderful 56 y.o. 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  Patient has a history of cervical cancer diagnosed in 2003 in [place] treated with total abdominal hysterectomy. Has been in remission since. He did not require any chemoradiation. Bilateral ovaries intact per patient.  She presented to Dr. Alferd Patee MD at [facility] in October 2014 with left-sided abdominal and chest pain and was found to have abnormal liver function tests. Subsequent CT chest abdomen pelvis showed large hepatic lesions. Biopsy of one of the liver lesions in the right lobe of the liver on 12/05/2012 showed diffuse large B-cell lymphoma with a Ki-67 of more than 95%. Bone marrow biopsy was done on 12/29/2012 and was noted to be negative for lymphoma. Patient subsequently had a PET/CT scan which showed lymphoma involving the liver, spleen, mesenteric lymph nodes as well as a T12 fracture for which she was given a back brace.  Patient has been treated with 6 cycles of R CHOP for suspected double hit DL BCL and received 4 intrathecal doses of methotrexate for CNS prophylaxis. PET/CT scan done on 08/31/2013 showed no evidence of residual lymphoma. She was noted to have bilateral nodular densities in bilateral lower lungs which were thought to be due to inflammation/infection and she was empirically treated with levofloxacin for one week.  She has had issues with back pain intermittently and significant renal stones. She notes that she had her parathyroids removed for hyperparathyroidism.  She subsequently moved from  [facility] to [place] and was seen on 08/05/2015 by Dr. Marcine Matar at the [facility] in [place] and had labs including a normal LDH of 135, normal CBC and normal chemistries  on 08/05/2015. She subsequently had a CT chest abdomen pelvis on 08/16/2015 that showed no evidence of abnormal lymphadenopathy within the chest abdomen or pelvis.  Noted to have an indeterminate 1.5 cm left adrenal gland nodule. Severe remote compression deformity of T12. She was noted to have left sided nephrolithiasis and simple appearing bilateral renal cysts for which she has been validated by Dr. Festus Aloe at [facility] urology.  Patient notes no fevers no chills no drenching night sweats. No weight loss. No other specific new focal symptoms at this time.  Notes that she has had issues with chronic back pains for which she has been using a brace. She has been validated by neurosurgery Dr. Sherwood Gambler and a decision has been made against surgery at this time. Has been using naproxen for her back pain.  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  INTERVAL HISTORY  Tracey Stevens is here for her scheduled 6 month follow-up for diffuse large B-cell lymphoma. She notes that she has been working out and following a diet and has consciously lost about 15 pounds which has made her feel better. She notes no fevers no chills no issues with night sweats. No unexpected weight loss. No new enlarged lymph nodes or skin rashes. Feels well overall.  Notes that she has a follow-up with Dr. Maudry Diego on 07/15/2016 for continued GYN cares in the setting of history of previous cervical cancer. She notes that she has had a fluttering feeling in her chest associated with shortness of breath. She notes that this has happened for years and was previously evaluated by cardiology in [place]. We discussed and she is  agreeable to talking to her primary care doctor about this and considering cardiology referral for evaluation of possible arrhythmias. No other acute new focal symptoms.   MEDICAL HISTORY:  Past Medical History:  Diagnosis Date  . Cancer (HCC)    cervical  . Hypertension   . 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  Vertigo-likely due to either etiology Adrenal nodule Renal cysts Nephrolithiasis Hypertension T12 compression fracture Multinodular goiter Primary hyperparathyroidism status post parathyroidectomy Vitamin D deficiency Left-sided conductive hearing loss   SURGICAL HISTORY: Past Surgical History:  Procedure Laterality Date  . ABDOMINAL HYSTERECTOMY    Biopsy of liver lesion noted to be diffuse large B-cell lymphoma in 2014. Parathyroidectomy-subtotal History of left ear surgery History of chemotherapy  SOCIAL HISTORY: Social History   Social History  . Marital status: Legally Separated    Spouse name: N/A  . Number of children: N/A  . Years of education: N/A   Occupational History  . Not on file.   Social History Main Topics  . Smoking status: Never Smoker  . Smokeless tobacco: Never Used  . Alcohol use No  . Drug use: No  . Sexual activity: Not on file   Other Topics Concern  . Not on file   Social History Narrative  . No narrative on file  Moved from [place] to [facility] about one year ago  FAMILY HISTORY:  Family history of coronary artery disease -mother  Hypertension-mother -Gastric cancer-father Renal failure father   ALLERGIES:  has No Known Allergies.  MEDICATIONS:  Current Outpatient Prescriptions  Medication Sig Dispense Refill  . Cholecalciferol (VITAMIN D PO) Take by mouth.    Marland Kitchen amLODipine (NORVASC) 10 MG tablet Take by  mouth.    Marland Kitchen amoxicillin-clavulanate (AUGMENTIN) 875-125 MG tablet Take by mouth.    . cetirizine (ZYRTEC) 10 MG tablet Take by mouth.    . cholecalciferol (VITAMIN D) 1000 units tablet Take by mouth.    . naproxen (NAPROSYN) 500 MG tablet TAKE 1 TABLET PO BID     No current facility-administered medications for this visit.     REVIEW OF SYSTEMS:    10 Point review of Systems was done is negative except as noted above.  PHYSICAL EXAMINATION: ECOG PERFORMANCE STATUS: 1 - Symptomatic but completely ambulatory  . Vitals:   07/06/16 1022  BP: 125/74  Pulse: 76  Resp: 18  Temp: 98.4 F (36.9 C)   Filed Weights   07/06/16 1022  Weight: 297 lb 8 oz (134.9 kg)   .Body mass index is 39.25 kg/m.  GENERAL:alert, in no acute distress and comfortable SKIN: skin color, texture, turgor are normal, no rashes or significant lesions EYES: normal, conjunctiva are pink and non-injected, sclera clear OROPHARYNX:no exudate, no erythema and lips, buccal mucosa, and tongue normal  NECK: supple, no JVD, thyroid normal size, non-tender, without nodularity LYMPH:  no palpable lymphadenopathy in the cervical, axillary or inguinal area.  LUNGS: clear to auscultation  with normal respiratory effort HEART: regular rate & rhythm,  no murmurs and no lower extremity edema ABDOMEN: abdomen soft, non-tender, normoactive bowel sounds , No palpable hepatosplenomegaly  Musculoskeletal: no cyanosis of digits and no clubbing , wearing a back brace  PSYCH: alert & oriented x 3 with fluent speech NEURO: no focal motor/sensory deficits  LABORATORY DATA:  I have reviewed the data as listed  . CBC Latest Ref Rng & Units 07/06/2016 01/06/2016  WBC 3.9 - 10.3 10e3/uL 4.1 4.0  Hemoglobin 11.6 - 15.9 g/dL 13.6 13.5  Hematocrit 34.8 - 46.6 % 39.1 39.4  Platelets 145 - 400 10e3/uL 312 309    . CMP Latest Ref Rng & Units 07/06/2016 01/06/2016  Glucose 70 - 140 mg/dl 99 88  BUN 7.0 - 26.0 mg/dL 12.4 13.9    Creatinine 0.6 - 1.1 mg/dL 1.0 0.9  Sodium 136 - 145 mEq/L 145 144  Potassium 3.5 - 5.1 mEq/L 3.5 4.0  CO2 22 - 29 mEq/L 30(H) 27  Calcium 8.4 - 10.4 mg/dL 10.3 10.2  Total Protein 6.4 - 8.3 g/dL 7.4 7.8  Total Bilirubin 0.20 - 1.20 mg/dL 0.66 0.60  Alkaline Phos 40 - 150 U/L 69 74  AST 5 - 34 U/L 16 20  ALT 0 - 55 U/L 13 19   . Lab Results  Component Value Date   LDH 152 07/06/2016     CT Chest Abdomen Pelvis W Contrast6/04/2015 [facility] Result Impression  IMPRESSION: 1.No evidence of abnormal lymphadenopathy within the chest, abdomen, or pelvis.  2.Indeterminate 1.5 cm left adrenal gland nodule.  3.Severe remote compression deformity of T12 which may reflect remote history of lymphoma.  4.Hepatic cyst  5. Simple appearing bilateral renal cysts. Left-sided nephrolithiasis.  6.Small hiatal hernia.  Result Narrative  INDICATION: C83.30: Diffuse large B-cell lymphoma, unspecified site.  COMPARISON:None available.  TECHNIQUE:CT CHEST ABDOMEN PELVIS W CONTRAST -100 mL Isovue 370 were administered intravenously. Dose reduction was utilized (automated exposure control, mA or kV adjustment based on patient size, or iterative image reconstruction).  FINDINGS:   SUPPORT APPARATUS: N.A.   LUNGS/PLEURA: No focal airspace opacities/consolidations. No pneumothorax.  No abnormal pulmonary masses.  No pleural effusions.  HEART/MEDIASTINUM: Cardiac size is normal. No acute thoracic aortic abnormalities.  No hilar, mediastinal, or axillary lymphadenopathy.  SOLID ABDOMINAL VISCERA: Liver: Small hepatic cysts measures 1.8 cm.. Gallbladder: Contracted Pancreas: Unremarkable. Adrenal glands: Indeterminate left gland nodule measuring 1.3 x 1.5 cm. Normal right adrenal gland. Spleen: Unremarkable. Kidneys: Multiple nonobstructing calculi within the left lower pole renal calyx largest measuring 8 mm. No hydronephrosis or perinephric stranding  bilaterally. There are multiple bilateral renal cysts larger of which are seen on the left measuring up to  6.5 cm in size. There are smaller right renal cysts largest 1.2 cm.  GI: No bowel obstruction. Small hiatal hernia. No focal bowel wall thickening or inflammatory changes. Normal appendix.   PERITONEAL CAVITY/RETROPERITONEUM: No free fluid. No pneumoperitoneum. No lymphadenopathy.  PELVIS: No acute abnormalities.  MUSCULOSKELETAL: No acute or destructive osseous processes.  MISC./CHRONIC FINDINGS: Atherosclerotic calcifications are present, particularly involving the visualized aorta. Chronic/degenerative changes at L5-S1. Remote severe compression deformity of T12.   RADIOGRAPHIC STUDIES: I have personally reviewed the radiological images as listed and agreed with the findings in the report. No results found.  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 Diagnosed October 2014. Patient was treated with R CHOP 6 cycles  and CNS prophylaxis with intrathecal methotrexate 4 doses. She is now about 3 years out and has been in complete remission. She has no constitutional symptoms at this time or palpable lymphadenopathy or hepatosplenomegaly or other focal symptoms to suggest lymphoma recurrence at this time. Her last CT chest abdomen pelvis on 08/16/2015 were negative for evidence of lymphoma. Plan -Patient has no clinical symptoms or physical examination findings of lymphoma recurrence at this time. -Her labs are stable with a normal CBC and normal LDH levels. --Unless any concerning findings on labs or patient has new concerning symptoms would hold off on additional scheduled imaging at this time. -She was counseled on concerning symptoms to monitor for an call us with.  #2 left-sided nephrolithiasis with renal cysts-being followed by Festus Aloe with the [facility]  urology.  #3 chronic back pains due to degenerative disc disease and compression fracture at T12 from lymphoma. -Follows with neurosurgery Dr. Sherwood Gambler.  #4 vertigo likely due to your etiology on meclizine when necessary. Has been scheduled for home physical therapy.  #5 Intermittent chest fluttering symptoms lasting <30 secs -- previous cardiology evaluation in [place]. Plan -Recommended she contact her primary care physician for further evaluation of these symptoms and consideration of cardiology referral for possible Holter/event monitoring to rule out supraventricular tachycardias or other rhythm abnormalities especially in the setting of thyroid and parathyroid disorders.  #6 Cervical cancer h/o - has f/u with Gyn   Return to care with Dr. Irene Limbo in 6 months with repeat CBC, CMP, LDH   All of the patients questions were answered with apparent satisfaction. The patient knows to call the clinic with any problems, questions or concerns.  I spent 20 minutes counseling the patient face to face. The total time spent in the appointment was 25 minutes and more than 50% was on counseling and direct patient cares and significant time spent reviewing bunch of outside records.    Sullivan Lone MD [place] AAHIVMS [facility] Hematology/Oncology Physician [facility]  (Office):       [phone] (Work cell):  [phone] (Fax):           [phone]  07/06/2016 11:16 AM

## 2016-09-21 ENCOUNTER — Other Ambulatory Visit: Payer: Self-pay | Admitting: Nurse Practitioner

## 2016-09-21 DIAGNOSIS — Z1231 Encounter for screening mammogram for malignant neoplasm of breast: Secondary | ICD-10-CM

## 2016-10-29 ENCOUNTER — Ambulatory Visit: Payer: Medicare HMO

## 2016-10-29 ENCOUNTER — Ambulatory Visit
Admission: RE | Admit: 2016-10-29 | Discharge: 2016-10-29 | Disposition: A | Discharge status: Home / Self Care | Payer: Medicare HMO | Source: Ambulatory Visit | Attending: Nurse Practitioner | Admitting: Nurse Practitioner

## 2016-10-29 DIAGNOSIS — Z1231 Encounter for screening mammogram for malignant neoplasm of breast: Secondary | ICD-10-CM

## 2016-12-22 NOTE — Progress Notes (Signed)
Marland Kitchen    HEMATOLOGY/ONCOLOGY CLINIC NOTE  Date of Service: 01/05/2017  Patient Care Team: Barrie Lyme, FNP as PCP - General (Nurse Practitioner)  CHIEF CMPLAINTS/PURPOSE OF CONSULTATION:  f/u forf Diffuse large B-cell lymphoma.   HISTORY OF PRESENTING ILLNESS:   Tracey Stevens is a wonderful 56 y.o. 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  Patient has a history of cervical cancer diagnosed in 2003 in [place] treated with total abdominal hysterectomy. Has been in remission since. He did not require any chemoradiation. Bilateral ovaries intact per patient.  She presented to Dr. Alferd Patee MD at [facility] in October 2014 with left-sided abdominal and chest pain and was found to have abnormal liver function tests. Subsequent CT chest abdomen pelvis showed large hepatic lesions. Biopsy of one of the liver lesions in the right lobe of the liver on 12/05/2012 showed diffuse large B-cell lymphoma with a Ki-67 of more than 95%. Bone marrow biopsy was done on 12/29/2012 and was noted to be negative for lymphoma. Patient subsequently had a PET/CT scan which showed lymphoma involving the liver, spleen, mesenteric lymph nodes as well as a T12 fracture for which she was given a back brace.  Patient has been treated with 6 cycles of R CHOP for suspected double hit DL BCL and received 4 intrathecal doses of methotrexate for CNS prophylaxis. PET/CT scan done on 08/31/2013 showed no evidence of residual lymphoma. She was noted to have bilateral nodular densities in bilateral lower lungs which were thought to be due to inflammation/infection and she was empirically treated with levofloxacin for one week.  She has had issues with back pain intermittently and significant renal stones. She notes that she had her parathyroids removed for hyperparathyroidism.  She subsequently moved from  [facility] to [place] and was seen on 08/05/2015 by Dr. Marcine Matar at the [facility] in [place] and had labs including a normal LDH of 135, normal CBC and normal chemistries  on 08/05/2015. She subsequently had a CT chest abdomen pelvis on 08/16/2015 that showed no evidence of abnormal lymphadenopathy within the chest abdomen or pelvis.  Noted to have an indeterminate 1.5 cm left adrenal gland nodule. Severe remote compression deformity of T12. She was noted to have left sided nephrolithiasis and simple appearing bilateral renal cysts for which she has been validated by Dr. Festus Aloe at [facility] urology.  Patient notes no fevers no chills no drenching night sweats. No weight loss. No other specific new focal symptoms at this time.  Notes that she has had issues with chronic back pains for which she has been using a brace. She has been validated by neurosurgery Dr. Sherwood Gambler and a decision has been made against surgery at this time. Has been using naproxen for her back pain.  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  INTERVAL HISTORY  Tracey Stevens is here for her scheduled 6 month follow-up for diffuse large B-cell lymphoma. She notes that she has been well overall.  She had a bone density test and colonoscopy completed since her last visit. She was evaluated by Dr. Buddy Duty and started on Fosamax Rx due to compression fracture. She notes that during her colonoscopy she had polyps removed that were benign. She  was informed that her next colonoscopy would be in 10 years. She denies new medications at this time. She is on phentermine that was prescribed by Dr. Emogene Morgan to aid with weight loss.   Since her last visit, she has had a screening bilateral mammogram with No evidence of malignancy.   On review of systems, she reports BLE swelling at night only that  sometimes resolved by the morning.  Denies fever, chills, unexpected weight loss. She reports mild night sweats that are intermittent and unchanged. She denies recent cough or cold. She doesn't exercise as much due to chronic right sided back pain that radiate to her right lateral thigh. She wears a brace for her back pain. She denies abdominal pain or issues with moving her bowels.  MEDICAL HISTORY:  Past Medical History:  Diagnosis Date  . Cancer (HCC)    cervical  . Hypertension   . 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  Vertigo-likely due to either etiology Adrenal nodule Renal cysts Nephrolithiasis Hypertension T12 compression fracture Multinodular goiter Primary hyperparathyroidism status post parathyroidectomy Vitamin D deficiency Left-sided conductive hearing loss   SURGICAL HISTORY: Past Surgical History:  Procedure Laterality Date  . ABDOMINAL HYSTERECTOMY    . BREAST BIOPSY Right    benign  Biopsy of liver lesion noted to be diffuse large B-cell lymphoma in 2014. Parathyroidectomy-subtotal History of left ear surgery History of chemotherapy  SOCIAL HISTORY: Social History   Social History  . Marital status: Legally Separated    Spouse name: N/A  . Number of children: N/A  . Years of education: N/A   Occupational History  . Not on file.   Social History Main Topics  . Smoking status: Never Smoker  . Smokeless tobacco: Never Used  . Alcohol use No  . Drug use: No  . Sexual activity: Not on file   Other Topics Concern  . Not on file   Social History Narrative  . No narrative on file  Moved from [place] to [facility] about one year ago  FAMILY HISTORY:  Family history of coronary artery disease -mother  Hypertension-mother -Gastric  cancer-father Renal failure father   ALLERGIES:  has No Known Allergies.  MEDICATIONS:  Current Outpatient Prescriptions  Medication Sig Dispense Refill  . amLODipine (NORVASC) 10 MG tablet Take by mouth.    Marland Kitchen amoxicillin-clavulanate (AUGMENTIN) 875-125 MG tablet Take by mouth.    . cetirizine (ZYRTEC) 10 MG tablet Take by mouth.    . Cholecalciferol (VITAMIN D PO) Take by mouth.    . cholecalciferol (VITAMIN D) 1000 units tablet Take by mouth.    . naproxen (NAPROSYN) 500 MG tablet TAKE 1 TABLET PO BID     No current facility-administered medications for this visit.     REVIEW OF SYSTEMS:    10 Point review of Systems was done is negative except as noted above.  PHYSICAL EXAMINATION: ECOG PERFORMANCE STATUS: 1 - Symptomatic but completely ambulatory  . Vitals:   01/05/17 1106  BP: 140/88  Pulse: 83  Resp: 18  Temp: 98.5 F (36.9 C)  SpO2: 100%   Filed Weights   01/05/17 1106  Weight: 299 lb 1.6 oz (135.7 kg)   .Body mass index is 39.46 kg/m.  GENERAL:alert, in no acute distress and comfortable SKIN: skin color, texture, turgor are normal,  no rashes or significant lesions EYES: normal, conjunctiva are pink and non-injected, sclera clear OROPHARYNX:no exudate, no erythema and lips, buccal mucosa, and tongue normal  NECK: supple, no JVD, thyroid normal size, non-tender, without nodularity LYMPH:  no palpable lymphadenopathy in the cervical, axillary or inguinal area.  LUNGS: clear to auscultation with normal respiratory effort HEART: regular rate & rhythm,  no murmurs and no lower extremity edema ABDOMEN: abdomen soft, non-tender, normoactive bowel sounds , No palpable hepatosplenomegaly  Musculoskeletal: no cyanosis of digits and no clubbing, not wearing a back brace currently. 1+ pitting edema bilaterally.  PSYCH: alert & oriented x 3 with fluent speech NEURO: no focal motor/sensory deficits  LABORATORY DATA:  I have reviewed the data as listed  . CBC  Latest Ref Rng & Units 01/05/2017 07/06/2016 01/06/2016  WBC 3.9 - 10.3 10e3/uL 3.5(L) 4.1 4.0  Hemoglobin 11.6 - 15.9 g/dL 13.1 13.6 13.5  Hematocrit 34.8 - 46.6 % 38.9 39.1 39.4  Platelets 145 - 400 10e3/uL 279 312 309    . CMP Latest Ref Rng & Units 01/05/2017 07/06/2016 01/06/2016  Glucose 70 - 140 mg/dl 93 99 88  BUN 7.0 - 26.0 mg/dL 14.3 12.4 13.9  Creatinine 0.6 - 1.1 mg/dL 1.1 1.0 0.9  Sodium 136 - 145 mEq/L 144 145 144  Potassium 3.5 - 5.1 mEq/L 4.1 3.5 4.0  CO2 22 - 29 mEq/L 29 30(H) 27  Calcium 8.4 - 10.4 mg/dL 10.1 10.3 10.2  Total Protein 6.4 - 8.3 g/dL 7.1 7.4 7.8  Total Bilirubin 0.20 - 1.20 mg/dL 0.62 0.66 0.60  Alkaline Phos 40 - 150 U/L 66 69 74  AST 5 - 34 U/L _0 ALT 0 - 55 U/L _1 . Lab Results  Component Value Date   LDH 174 01/05/2017     CT Chest Abdomen Pelvis W Contrast6/04/2015 [facility] Result Impression  IMPRESSION: 1.No evidence of abnormal lymphadenopathy within the chest, abdomen, or pelvis.  2.Indeterminate 1.5 cm left adrenal gland nodule.  3.Severe remote compression deformity of T12 which may reflect remote history of lymphoma.  4.Hepatic cyst  5. Simple appearing bilateral renal cysts. Left-sided nephrolithiasis.  6.Small hiatal hernia.  Result Narrative  INDICATION: C83.30: Diffuse large B-cell lymphoma, unspecified site.  COMPARISON:None available.  TECHNIQUE:CT CHEST ABDOMEN PELVIS W CONTRAST -100 mL Isovue 370 were administered intravenously. Dose reduction was utilized (automated exposure control, mA or kV adjustment based on patient size, or iterative image reconstruction).  FINDINGS:   SUPPORT APPARATUS: N.A.   LUNGS/PLEURA: No focal airspace opacities/consolidations. No pneumothorax.  No abnormal pulmonary masses.  No pleural effusions.  HEART/MEDIASTINUM: Cardiac size is normal. No acute thoracic aortic abnormalities.  No hilar, mediastinal, or axillary  lymphadenopathy.  SOLID ABDOMINAL VISCERA: Liver: Small hepatic cysts measures 1.8 cm.. Gallbladder: Contracted Pancreas: Unremarkable. Adrenal glands: Indeterminate left gland nodule measuring 1.3 x 1.5 cm. Normal right adrenal gland. Spleen: Unremarkable. Kidneys: Multiple nonobstructing calculi within the left lower pole renal calyx largest measuring 8 mm. No hydronephrosis or perinephric stranding bilaterally. There are multiple bilateral renal cysts larger of which are seen on the left measuring up to  6.5 cm in size. There are smaller right renal cysts largest 1.2 cm.  GI: No bowel obstruction. Small hiatal hernia. No focal bowel wall thickening or inflammatory changes. Normal appendix.   PERITONEAL CAVITY/RETROPERITONEUM: No free fluid. No pneumoperitoneum. No lymphadenopathy.  PELVIS: No acute abnormalities.  MUSCULOSKELETAL: No acute or destructive osseous processes.  MISC./CHRONIC FINDINGS: Atherosclerotic calcifications  are present, particularly involving the visualized aorta. Chronic/degenerative changes at L5-S1. Remote severe compression deformity of T12.   RADIOGRAPHIC STUDIES: I have personally reviewed the radiological images as listed and agreed with the findings in the report. No results found.  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 Diagnosed October 2014. Patient was treated with R CHOP 6 cycles and CNS prophylaxis with intrathecal methotrexate 4 doses. She is now about 3 years out and has been in complete remission. She has no constitutional symptoms at this time or palpable lymphadenopathy or hepatosplenomegaly or other focal symptoms to suggest lymphoma recurrence at this time. Her last CT chest abdomen pelvis on 08/16/2015 were negative for evidence of lymphoma. Plan -Patient has no clinical symptoms or physical examination findings of lymphoma  recurrence at this time. -Her labs are stable with a normal CBC and normal LDH levels. --Unless any concerning findings on labs or patient has new concerning symptoms would hold off on additional scheduled imaging at this time. -She was previously counseled on concerning symptoms to monitor for an call us with.  #2 left-sided nephrolithiasis with renal cysts-being followed by Festus Aloe with the [facility] urology.  #3 chronic back pains due to degenerative disc disease and compression fracture at T12 from lymphoma. -Follows with neurosurgery Dr. Sherwood Gambler. -Discussed possible referral to [place] class to aid with patient back pains, she would like to hold off on his referral until after the holidays.  #4 Cervical cancer h/o -continue f/u with Gyn   -Follow up with PCP for repeat labs as scheduled in 3 months with observation of WBC.   -Return to care with Dr. Irene Limbo in 6 months with repeat CBC, CMP, LDH   All of the patients questions were answered with apparent satisfaction. The patient knows to call the clinic with any problems, questions or concerns.  I spent 20 minutes counseling the patient face to face. The total time spent in the appointment was 25 minutes and more than 50% was on counseling and direct patient cares and significant time spent reviewing bunch of outside records.    Sullivan Lone MD [place] AAHIVMS [facility] Hematology/Oncology Physician [place]  (Office):       [phone] (Work cell):  [phone] (Fax):           [phone]  01/05/2017 12:09 PM  This document serves as a record of services personally performed by Sullivan Lone, MD. It was created on her behalf by Steva Colder, a trained medical scribe. The creation of this record is based on the scribe's personal observations and the provider's statements to them. This document has been checked and approved by the attending provider.

## 2017-01-05 ENCOUNTER — Other Ambulatory Visit (HOSPITAL_BASED_OUTPATIENT_CLINIC_OR_DEPARTMENT_OTHER): Payer: Medicare HMO

## 2017-01-05 ENCOUNTER — Ambulatory Visit (HOSPITAL_BASED_OUTPATIENT_CLINIC_OR_DEPARTMENT_OTHER): Payer: Medicare HMO | Admitting: Hematology

## 2017-01-05 VITALS — BP 140/88 | HR 83 | Temp 98.5°F | Resp 18 | Ht 73.0 in | Wt 299.1 lb

## 2017-01-05 DIAGNOSIS — C8338 Diffuse large B-cell lymphoma, lymph nodes of multiple sites: Secondary | ICD-10-CM

## 2017-01-05 DIAGNOSIS — Z8541 Personal history of malignant neoplasm of cervix uteri: Secondary | ICD-10-CM

## 2017-01-05 DIAGNOSIS — Z8572 Personal history of non-Hodgkin lymphomas: Secondary | ICD-10-CM

## 2017-01-05 LAB — CBC & DIFF AND RETIC
BASO%: 0.6 % (ref 0.0–2.0)
Basophils Absolute: 0 10*3/uL (ref 0.0–0.1)
EOS%: 4.5 % (ref 0.0–7.0)
Eosinophils Absolute: 0.2 10*3/uL (ref 0.0–0.5)
HCT: 38.9 % (ref 34.8–46.6)
HGB: 13.1 g/dL (ref 11.6–15.9)
IMMATURE RETIC FRACT: 1.8 % (ref 1.60–10.00)
LYMPH#: 1.7 10*3/uL (ref 0.9–3.3)
LYMPH%: 48.7 % (ref 14.0–49.7)
MCH: 30.7 pg (ref 25.1–34.0)
MCHC: 33.7 g/dL (ref 31.5–36.0)
MCV: 91.1 fL (ref 79.5–101.0)
MONO#: 0.4 10*3/uL (ref 0.1–0.9)
MONO%: 12.5 % (ref 0.0–14.0)
NEUT%: 33.7 % — ABNORMAL LOW (ref 38.4–76.8)
NEUTROS ABS: 1.2 10*3/uL — AB (ref 1.5–6.5)
Platelets: 279 10*3/uL (ref 145–400)
RBC: 4.27 10*6/uL (ref 3.70–5.45)
RDW: 13.3 % (ref 11.2–14.5)
RETIC %: 1.14 % (ref 0.70–2.10)
RETIC CT ABS: 48.68 10*3/uL (ref 33.70–90.70)
WBC: 3.5 10*3/uL — ABNORMAL LOW (ref 3.9–10.3)

## 2017-01-05 LAB — COMPREHENSIVE METABOLIC PANEL
ALT: 9 U/L (ref 0–55)
AST: 17 U/L (ref 5–34)
Albumin: 3.8 g/dL (ref 3.5–5.0)
Alkaline Phosphatase: 66 U/L (ref 40–150)
Anion Gap: 9 mEq/L (ref 3–11)
BUN: 14.3 mg/dL (ref 7.0–26.0)
CHLORIDE: 106 meq/L (ref 98–109)
CO2: 29 meq/L (ref 22–29)
CREATININE: 1.1 mg/dL (ref 0.6–1.1)
Calcium: 10.1 mg/dL (ref 8.4–10.4)
EGFR: >60 mL/min/{1.73_m2} (ref >60)
Glucose: 93 mg/dl (ref 70–140)
Potassium: 4.1 mEq/L (ref 3.5–5.1)
SODIUM: 144 meq/L (ref 136–145)
TOTAL PROTEIN: 7.1 g/dL (ref 6.4–8.3)
Total Bilirubin: 0.62 mg/dL (ref 0.20–1.20)

## 2017-01-05 LAB — LACTATE DEHYDROGENASE: LDH: 174 U/L (ref 125–245)

## 2017-01-06 ENCOUNTER — Telehealth: Payer: Self-pay | Admitting: Hematology

## 2017-01-06 NOTE — Telephone Encounter (Signed)
Spoke with patient regarding appts that were added per 10/23 los.

## 2017-02-02 DIAGNOSIS — M5430 Sciatica, unspecified side: Secondary | ICD-10-CM | POA: Insufficient documentation

## 2017-02-08 ENCOUNTER — Emergency Department (HOSPITAL_COMMUNITY): Payer: Medicare HMO

## 2017-02-08 ENCOUNTER — Emergency Department (HOSPITAL_COMMUNITY)
Admission: EM | Admit: 2017-02-08 | Discharge: 2017-02-08 | Disposition: A | Discharge status: Home / Self Care | Payer: Medicare HMO | Attending: Emergency Medicine | Admitting: Emergency Medicine

## 2017-02-08 ENCOUNTER — Encounter (HOSPITAL_COMMUNITY): Payer: Self-pay | Admitting: Emergency Medicine

## 2017-02-08 DIAGNOSIS — I10 Essential (primary) hypertension: Secondary | ICD-10-CM | POA: Insufficient documentation

## 2017-02-08 DIAGNOSIS — Z79899 Other long term (current) drug therapy: Secondary | ICD-10-CM | POA: Insufficient documentation

## 2017-02-08 DIAGNOSIS — M5432 Sciatica, left side: Secondary | ICD-10-CM

## 2017-02-08 DIAGNOSIS — M79605 Pain in left leg: Secondary | ICD-10-CM | POA: Diagnosis present

## 2017-02-08 DIAGNOSIS — M5430 Sciatica, unspecified side: Secondary | ICD-10-CM | POA: Insufficient documentation

## 2017-02-08 MED ORDER — CYCLOBENZAPRINE HCL 10 MG PO TABS
5.0000 mg | ORAL_TABLET | Freq: Once | ORAL | Status: AC
  Administered 2017-02-08: 5 mg via ORAL
  Filled 2017-02-08: qty 1

## 2017-02-08 MED ORDER — CYCLOBENZAPRINE HCL 5 MG PO TABS: 5.0000 mg | ORAL_TABLET | Freq: Two times a day (BID) | ORAL | 0 refills | Status: DC

## 2017-02-08 NOTE — ED Triage Notes (Addendum)
Patient c/o left leg pain x1 week. Denies injury. Ambulatory with cane. Full movement and sensation to left leg.

## 2017-02-08 NOTE — ED Provider Notes (Signed)
[facility] DEPT Provider Note   CSN: [id number] Arrival date & time: 02/08/17  1023     History   Chief Complaint Chief Complaint  Patient presents with  . Leg Pain    HPI Tracey Stevens is a 56 y.o. female who presents to the ED for left hip and leg pain. The pain started over a week ago and has gotten worse. Patient could not get an appointment with her PCP so she came in due to increased pain. Patient reports that the pain is worse with standing and ambulating. Patient has been taking tylenol and naprosyn without relief.  The history is provided by the patient. No language interpreter was used.  Leg Pain   This is a new problem. The pain is present in the left hip. She has tried OTC pain medications for the symptoms. There has been no history of extremity trauma. hx of arthritis    Past Medical History:  Diagnosis Date  . Cancer (HCC)    cervical  . Hypertension   . Lymphoma in remission [facility]) 2015 remission    Patient Active Problem List   Diagnosis Date Noted  . Lymphoma in remission [facility])     Past Surgical History:  Procedure Laterality Date  . ABDOMINAL HYSTERECTOMY    . BREAST BIOPSY Right    benign    OB History    No data available       Home Medications    Prior to Admission medications   Medication Sig Start Date End Date Taking? Authorizing Provider  amLODipine (NORVASC) 10 MG tablet Take by mouth. 12/09/15   [provider]  amoxicillin-clavulanate (AUGMENTIN) 875-125 MG tablet Take by mouth.    [provider]  cetirizine (ZYRTEC) 10 MG tablet Take by mouth. 12/09/15   [provider]  Cholecalciferol (VITAMIN D PO) Take by mouth.    [provider]  cholecalciferol (VITAMIN D) 1000 units tablet Take by mouth.    [provider]  cyclobenzaprine (FLEXERIL) 5 MG tablet Take 1 tablet (5 mg total) by mouth 2 (two) times daily. 02/08/17   Ashley Murrain, NP  naproxen  (NAPROSYN) 500 MG tablet TAKE 1 TABLET PO BID 08/21/15   [provider]    Family History No family history on file.  Social History Social History   Tobacco Use  . Smoking status: Never Smoker  . Smokeless tobacco: Never Used  Substance Use Topics  . Alcohol use: No  . Drug use: No     Allergies   Patient has no known allergies.   Review of Systems Review of Systems  Constitutional: Negative for chills and fever.  HENT: Negative.   Eyes: Negative for visual disturbance.  Respiratory: Negative for cough and shortness of breath.   Cardiovascular: Negative for chest pain.  Gastrointestinal: Negative for abdominal pain, nausea and vomiting.  Musculoskeletal: Positive for arthralgias and back pain.       Left hip pain  Skin: Negative for rash and wound.  Neurological: Negative for syncope and headaches.  Psychiatric/Behavioral: Positive for confusion.     Physical Exam Updated Vital Signs BP (!) 150/97 (BP Location: Left Arm)   Pulse 90   Temp 98.8 F (37.1 C) (Oral)   Resp 16   Ht 6\' 1"  (1.854 m)   Wt 124.4 kg (274 lb 4.8 oz)   SpO2 99%   BMI 36.19 kg/m   Physical Exam  Constitutional: She appears well-developed and well-nourished. No distress.  HENT:  Head: Normocephalic and atraumatic.  Eyes: EOM are normal.  Neck: Normal range of motion. Neck supple.  Cardiovascular: Normal rate.  Pulmonary/Chest: Effort normal.  Musculoskeletal:       Lumbar back: She exhibits tenderness and spasm. She exhibits normal pulse. Decreased range of motion: due to pain.       Back:  Neurological: She is alert.  Reflex Scores:      Patellar reflexes are 2+ on the right side and 2+ on the left side. Skin: Skin is warm and dry.  Psychiatric: She has a normal mood and affect.  Nursing note and vitals reviewed.    ED Treatments / Results  Labs (all labs ordered are listed, but only abnormal results are displayed) Labs Reviewed - No data to  display  Radiology Dg Hip Unilat W Or Wo Pelvis 2-3 Views Left  Result Date: 02/08/2017 CLINICAL DATA:  55 year old female with history of left-sided hip pain. No history of injury. EXAM: DG HIP (WITH OR WITHOUT PELVIS) 2-3V LEFT COMPARISON:  No priors. FINDINGS: AP view of the bony pelvis and AP and lateral views of the left hip demonstrate no acute displaced fracture. Left hip is located. There is mild joint space narrowing, subchondral sclerosis, and osteophyte formation in the hip joints bilaterally, compatible with mild bilateral hip joint osteoarthritis. IMPRESSION: 1. No acute radiographic abnormality of the bony pelvis or the left hip. 2. Mild bilateral hip joint osteoarthritis. Electronically Signed   By: Vinnie Langton M.D.   On: 02/08/2017 14:43    Procedures Procedures (including critical care time)  Medications Ordered in ED Medications - No data to display   Initial Impression / Assessment and Plan / ED Course  I have reviewed the triage vital signs and the nursing notes. Final Clinical Impressions(s) / ED Diagnoses  56 y.o. female with left hip pain that radiates to the left knee stable for d/c without acute findings on x-ray. Patient with osteoarthritis.  No neurological deficits Patient can walk but states is painful.  No loss of bowel or bladder control.  No concern for cauda equina.  No fever, night sweats, weight loss. Will treat with muscle relaxant and patient will continue her Naprosyn. She will f/u with her PCP for continued management of her pain. She will return for any problems.   Final diagnoses:  Sciatica, left side    ED Discharge Orders        Ordered    cyclobenzaprine (FLEXERIL) 5 MG tablet  2 times daily     02/08/17 1513       Debroah Baller [place], [place] 02/08/17 1518    Dorie Rank, MD 02/09/17 1236

## 2017-02-08 NOTE — Discharge Instructions (Signed)
Take the medication as directed. Do not take the medication if your area driving or doing any activity that may cause injury as the medications will make your sleepy. Follow up with your doctor as soon as possible to discuss ongoing treatment. Return here as needed. You may continue to take your Naprosyn with the medication we give you.

## 2017-02-15 DIAGNOSIS — M160 Bilateral primary osteoarthritis of hip: Secondary | ICD-10-CM | POA: Insufficient documentation

## 2017-02-24 ENCOUNTER — Encounter (HOSPITAL_COMMUNITY): Payer: Self-pay

## 2017-02-24 DIAGNOSIS — I10 Essential (primary) hypertension: Secondary | ICD-10-CM | POA: Diagnosis not present

## 2017-02-24 DIAGNOSIS — R112 Nausea with vomiting, unspecified: Secondary | ICD-10-CM | POA: Insufficient documentation

## 2017-02-24 DIAGNOSIS — Z79899 Other long term (current) drug therapy: Secondary | ICD-10-CM | POA: Diagnosis not present

## 2017-02-24 DIAGNOSIS — M5432 Sciatica, left side: Secondary | ICD-10-CM | POA: Diagnosis not present

## 2017-02-24 DIAGNOSIS — Z8541 Personal history of malignant neoplasm of cervix uteri: Secondary | ICD-10-CM | POA: Diagnosis not present

## 2017-02-24 NOTE — ED Triage Notes (Signed)
Pt BIB [facility] from home c/o nausea and vomiting since yesterday. She denies any bowel movements for 5 days. No abdominal tenderness noted. Pt ambulatory with cane. A&Ox4.

## 2017-02-24 NOTE — ED Triage Notes (Signed)
Pt complains of left leg pain that was dx with sciatica last week, she was given flexeril with no relief Pt also complains of vomiting

## 2017-02-25 ENCOUNTER — Emergency Department (HOSPITAL_COMMUNITY)
Admission: EM | Admit: 2017-02-25 | Discharge: 2017-02-25 | Disposition: A | Discharge status: Home / Self Care | Payer: Medicare HMO | Attending: Emergency Medicine | Admitting: Emergency Medicine

## 2017-02-25 DIAGNOSIS — R112 Nausea with vomiting, unspecified: Secondary | ICD-10-CM

## 2017-02-25 DIAGNOSIS — G8929 Other chronic pain: Secondary | ICD-10-CM

## 2017-02-25 DIAGNOSIS — M5442 Lumbago with sciatica, left side: Secondary | ICD-10-CM

## 2017-02-25 LAB — CBC WITH DIFFERENTIAL/PLATELET
BASOS ABS: 0 10*3/uL (ref 0.0–0.1)
Basophils Relative: 0 %
Eosinophils Absolute: 0.2 10*3/uL (ref 0.0–0.7)
Eosinophils Relative: 4 %
HEMATOCRIT: 39.6 % (ref 36.0–46.0)
Hemoglobin: 13.9 g/dL (ref 12.0–15.0)
LYMPHS ABS: 2.1 10*3/uL (ref 0.7–4.0)
LYMPHS PCT: 38 %
MCH: 31.3 pg (ref 26.0–34.0)
MCHC: 35.1 g/dL (ref 30.0–36.0)
MCV: 89.2 fL (ref 78.0–100.0)
MONO ABS: 0.5 10*3/uL (ref 0.1–1.0)
MONOS PCT: 10 %
NEUTROS ABS: 2.7 10*3/uL (ref 1.7–7.7)
Neutrophils Relative %: 48 %
Platelets: 317 10*3/uL (ref 150–400)
RBC: 4.44 MIL/uL (ref 3.87–5.11)
RDW: 13.1 % (ref 11.5–15.5)
WBC: 5.5 10*3/uL (ref 4.0–10.5)

## 2017-02-25 LAB — COMPREHENSIVE METABOLIC PANEL
ALT: 14 U/L (ref 14–54)
AST: 14 U/L — AB (ref 15–41)
Albumin: 4 g/dL (ref 3.5–5.0)
Alkaline Phosphatase: 56 U/L (ref 38–126)
Anion gap: 7 (ref 5–15)
BILIRUBIN TOTAL: 0.9 mg/dL (ref 0.3–1.2)
BUN: 15 mg/dL (ref 6–20)
CO2: 28 mmol/L (ref 22–32)
Calcium: 9.2 mg/dL (ref 8.9–10.3)
Chloride: 105 mmol/L (ref 101–111)
Creatinine, Ser: 0.89 mg/dL (ref 0.44–1.00)
Glucose, Bld: 97 mg/dL (ref 65–99)
POTASSIUM: 3.3 mmol/L — AB (ref 3.5–5.1)
Sodium: 140 mmol/L (ref 135–145)
TOTAL PROTEIN: 7.2 g/dL (ref 6.5–8.1)

## 2017-02-25 MED ORDER — ONDANSETRON 4 MG PO TBDP
4.0000 mg | ORAL_TABLET | Freq: Once | ORAL | Status: AC
  Administered 2017-02-25: 4 mg via ORAL
  Filled 2017-02-25: qty 1

## 2017-02-25 MED ORDER — LIDOCAINE 5 % EX PTCH: 1.0000 | MEDICATED_PATCH | CUTANEOUS | 1 refills | Status: DC

## 2017-02-25 NOTE — ED Provider Notes (Signed)
[facility] DEPT Provider Note   CSN: [id number] Arrival date & time: 02/24/17  2350     History   Chief Complaint Chief Complaint  Patient presents with  . Leg Pain  . Vomiting    HPI Tracey Stevens is a 56 y.o. female w PMHx lymphoma (in remission x 3 years), HTN, presenting to ED with nausea and vomiting that began Tuesday. Pt states she vomited about 3 times on Tuesday and Wednesday, nonbloody nonbilious emesis. No episodes of emesis overnight while waiting to be seen. Reports some current nausea. Denies abd pain, F/C, urinary sx. Reports second complaint today of persistent left sided back and leg pain. Pt states she was diagnosed with sciatica, though has not had relief with PCP interventions or flexeril that was recently prescribed from ED visit. States pain is worse left lower leg anteriorly, worse at night and with ambulation. No N/T, no recent injury, no bowel/bladder incontinence.  The history is provided by the patient.    Past Medical History:  Diagnosis Date  . Cancer (HCC)    cervical  . Hypertension   . Lymphoma in remission [facility]) 2015 remission    Patient Active Problem List   Diagnosis Date Noted  . Lymphoma in remission [facility])     Past Surgical History:  Procedure Laterality Date  . ABDOMINAL HYSTERECTOMY    . BREAST BIOPSY Right    benign    OB History    No data available       Home Medications    Prior to Admission medications   Medication Sig Start Date End Date Taking? Authorizing Provider  alendronate (FOSAMAX) 70 MG tablet Take 70 mg by mouth once a week. Take with a full glass of water on an empty stomach.   Yes [provider]  amLODipine (NORVASC) 10 MG tablet Take 10 mg by mouth daily.  12/09/15  Yes [provider]  atorvastatin (LIPITOR) 10 MG tablet Take 10 mg by mouth daily.   Yes [provider]  azelastine (ASTELIN) 0.1 % nasal spray Place 1 spray into both nostrils 2  (two) times daily. Use in each nostril as directed   Yes [provider]  cholecalciferol (VITAMIN D) 1000 units tablet Take 1,000 Units by mouth daily.    Yes [provider]  cyclobenzaprine (FLEXERIL) 5 MG tablet Take 1 tablet (5 mg total) by mouth 2 (two) times daily. 02/08/17  Yes Neese, [place] M, NP  fluticasone (FLONASE) 50 MCG/ACT nasal spray Place 1 spray into both nostrils daily.   Yes [provider]  hydrochlorothiazide (HYDRODIURIL) 25 MG tablet Take 25 mg by mouth daily.   Yes [provider]  naproxen (NAPROSYN) 500 MG tablet TAKE 1 TABLET BY MOUTH TWICE DAILY   Yes [provider]  lidocaine (LIDODERM) 5 % Place 1 patch onto the skin daily. Remove & Discard patch within 12 hours or as directed by MD 02/25/17   Robinson, [place] N, PA-C    Family History History reviewed. No pertinent family history.  Social History Social History   Tobacco Use  . Smoking status: Never Smoker  . Smokeless tobacco: Never Used  Substance Use Topics  . Alcohol use: No  . Drug use: No     Allergies   Patient has no known allergies.   Review of Systems Review of Systems  Constitutional: Negative for fever.  Gastrointestinal: Positive for constipation, nausea and vomiting. Negative for abdominal pain, blood in stool and diarrhea.  Genitourinary: Negative for dysuria and frequency.  Musculoskeletal: Positive for back pain and myalgias.  Skin: Negative for color change.  Neurological: Negative for weakness and numbness.  All other systems reviewed and are negative.    Physical Exam Updated Vital Signs BP (!) 145/96 (BP Location: Left Arm)   Pulse 81   Temp 98 F (36.7 C) (Oral)   Resp 20   SpO2 98%   Physical Exam  Constitutional: She appears well-developed and well-nourished. No distress.  Resting comfortably in bed  HENT:  Head: Normocephalic and atraumatic.  Mouth/Throat: Oropharynx is clear and moist.  Eyes: Conjunctivae are  normal.  Neck: Normal range of motion. Neck supple.  Cardiovascular: Normal rate, regular rhythm, normal heart sounds and intact distal pulses.  Pulmonary/Chest: Effort normal and breath sounds normal. No respiratory distress.  Abdominal: Soft. Bowel sounds are normal. She exhibits no distension and no mass. There is no tenderness. There is no rebound and no guarding.  Musculoskeletal:       Back:  TTP over left lateral lower back into gluteus. TTP over anterior left lower leg, no calf tenderness, no erythema, no edema. No spinal tenderness.  Neurological: She is alert.  Motor:  Normal tone. 5/5 in lower extremities bilaterally including strong and equaldorsiflexion/plantar flexion Sensory: Pinprick and light touch normal in all extremities.  Deep Tendon Reflexes: 2+ and symmetric in the patella Gait: normal gait and balance CV: distal pulses palpable throughout    Skin: Skin is warm.  Psychiatric: She has a normal mood and affect. Her behavior is normal.  Nursing note and vitals reviewed.    ED Treatments / Results  Labs (all labs ordered are listed, but only abnormal results are displayed) Labs Reviewed  COMPREHENSIVE METABOLIC PANEL - Abnormal; Notable for the following components:      Result Value   Potassium 3.3 (*)    AST 14 (*)    All other components within normal limits  CBC WITH DIFFERENTIAL/PLATELET    EKG  EKG Interpretation None       Radiology No results found.  Procedures Procedures (including critical care time)  Medications Ordered in ED Medications  ondansetron (ZOFRAN-ODT) disintegrating tablet 4 mg (4 mg Oral Given 02/25/17 3825)     Initial Impression / Assessment and Plan / ED Course  I have reviewed the triage vital signs and the nursing notes.  Pertinent labs & imaging results that were available during my care of the patient were reviewed by me and considered in my medical decision making (see chart for details).     Patient  presenting with nausea and vomiting, was complaining of persistent pain from left-sided sciatica.  Patient is well-appearing, nontoxic, not in distress.  Vital signs stable.  Afebrile.  Abdominal exam benign, without tenderness.  Patient not vomiting in the ED and tolerating p.o. fluids prior to discharge. Suspect viral gastroenteritis as cause of sx.  CBC and CMP reassuring.  Regarding sciatica, no neuro deficits, no new injury.  Will discharge with Lidoderm patches for symptomatic relief and recommend follow-up with PCP.  Patient is safe for discharge at this time.  Patient discussed with and seen by Dr. Lacinda Axon.  Discussed results, findings, treatment and follow up. Patient advised of return precautions. Patient verbalized understanding and agreed with plan.  Final Clinical Impressions(s) / ED Diagnoses   Final diagnoses:  Nausea and vomiting in adult patient  Chronic left-sided low back pain with left-sided sciatica    ED Discharge Orders  Ordered    lidocaine (LIDODERM) 5 %  Every 24 hours     02/25/17 1027       Robinson, [place] N, PA-C 02/25/17 1035    Nat Christen, MD 02/26/17 1139

## 2017-02-25 NOTE — Discharge Instructions (Signed)
Please read instructions below. Drink clear liquids until your stomach feels better. Then, slowly introduce bland foods into your diet as tolerated, such as bread, rice, apples, bananas. Follow up with your primary care if symptoms persist. Return to the ER for severe abdominal pain, fever, uncontrollable vomiting, or new or concerning symptoms.

## 2017-03-18 DIAGNOSIS — M48061 Spinal stenosis, lumbar region without neurogenic claudication: Secondary | ICD-10-CM | POA: Insufficient documentation

## 2017-03-26 ENCOUNTER — Encounter: Payer: Self-pay | Admitting: Physical Therapy

## 2017-03-26 ENCOUNTER — Ambulatory Visit: Payer: Medicare HMO | Attending: Family Medicine | Admitting: Physical Therapy

## 2017-03-26 DIAGNOSIS — M6281 Muscle weakness (generalized): Secondary | ICD-10-CM

## 2017-03-26 DIAGNOSIS — M25552 Pain in left hip: Secondary | ICD-10-CM | POA: Insufficient documentation

## 2017-03-26 DIAGNOSIS — M62838 Other muscle spasm: Secondary | ICD-10-CM | POA: Insufficient documentation

## 2017-03-26 DIAGNOSIS — G8929 Other chronic pain: Secondary | ICD-10-CM | POA: Diagnosis present

## 2017-03-26 DIAGNOSIS — M545 Low back pain: Secondary | ICD-10-CM | POA: Diagnosis not present

## 2017-03-26 NOTE — Therapy (Signed)
[facility] [address] [place], [place], [address] Phone: [phone]   Fax:  [phone]  Physical Therapy Evaluation  Patient Details  Name: Tracey Stevens MRN: [id number] Date of Birth: 1960/08/27 Referring Provider: Bernerd Limbo, MD    Encounter Date: 03/26/2017  PT End of Session - 03/26/17 1147    Visit Number  1    Number of Visits  16    Date for PT Re-Evaluation  05/21/17    Authorization Type  [employer]     Authorization Time Period  03/26/17 to 05/21/17    PT Start Time  1016    PT Stop Time  1058    PT Time Calculation (min)  42 min    Activity Tolerance  No increased pain;Patient tolerated treatment well    Behavior During Therapy  [facility] for tasks assessed/performed       Past Medical History:  Diagnosis Date  . Cancer (HCC)    cervical  . Hypertension   . Lymphoma in remission ([place]) 2015 remission    Past Surgical History:  Procedure Laterality Date  . ABDOMINAL HYSTERECTOMY    . BREAST BIOPSY Right    benign    There were no vitals filed for this visit.   Subjective Assessment - 03/26/17 1020    Subjective  Pt reports onset of Lt leg pain 2-3 days before Thanksgiving. She states it came out of nowhere. She has pain primarily on the lateral aspect of her Lt leg which can sometimes shoot up to the Lt hip and down to the Lt ankle. She will occasionally have low back pain with radiating pain around the front into her groin area. She also notes that she had a couple of kidney stones since April, and she still has one left after removal of the others.     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  10 min for the low back    Patient Stated Goals  MRI:     Currently in Pain?  No/denies    Pain Location  Other (Comment) lateral knee/shin/thigh    Pain Orientation  Left;Lateral    Pain Descriptors / Indicators   Aching;Throbbing    Pain Type  Chronic pain    Pain Radiating Towards  none     Pain Onset  More than a month ago    Pain Frequency  Intermittent    Aggravating Factors   laying on the Lt side will increase Lt knee/shin pain; standing/walking too long will increase the low back pain; stairs    Pain Relieving Factors  gabapentin and pain medication is helping; rubbing    Effect of Pain on Daily Activities  moderate          [facility] PT Assessment - 03/26/17 0001      Assessment   Medical Diagnosis  Lt leg pain    Referring Provider  Bernerd Limbo, MD     Onset Date/Surgical Date  -- Thanksgiving 2018    Next MD Visit  unsure for this     Prior Therapy  none       Precautions   Precautions  None      Balance Screen   Has the patient fallen in the past 6 months  No    Has the patient had a decrease in activity level because of a fear of falling?  No    Is the patient reluctant to leave their home because of a fear of falling?   No      Home Environment   Additional Comments  flight of stairs to get into house      Prior Function   Level of Independence  Independent      Cognition   Overall Cognitive Status  Within Functional Limits for tasks assessed      Observation/Other Assessments   Focus on Therapeutic Outcomes (FOTO)   next session      Sensation   Additional Comments  Pt denies any numbness/tingling      ROM / Strength   AROM / PROM / Strength  AROM;Strength      AROM   AROM Assessment Site  Lumbar    Lumbar Flexion  pulling lumbar spine, 50% limited, repeated flexion in standing down Lt lateral knee area after 3 reps    Lumbar Extension  repeated extension in standing no change      Strength   Strength Assessment Site  Hip;Knee;Ankle    Right/Left Hip  Right;Left    Right Hip Flexion  5/5    Right Hip Extension  3-/5 ROM limited     Right Hip ABduction  3/5    Left Hip Flexion  5/5    Left Hip Extension  3-/5 ROM limited    Left Hip ABduction  3-/5     Right/Left Knee  Right;Left    Right Knee Extension  5/5    Left Knee Extension  5/5    Right/Left Ankle  Right;Left    Right Ankle Dorsiflexion  5/5    Left Ankle Dorsiflexion  5/5      Flexibility   Soft Tissue Assessment /Muscle Length  yes    Hamstrings  45 deg lacking Lt/Rt in 90/90 testing    Quadriceps  90 deg Lt/Rt in prone      Palpation   Palpation comment  tender along Lt glute med/lateral quad/peroneals/lateral gastroc/soleue; tender along lumbar paraspinals       Special Tests    Special Tests  Lumbar;Hip Special Tests    Lumbar Tests  Slump Test;Prone Knee Bend Test;Straight Leg Raise    Hip Special Tests   Thomas Test;Ober's Test      Slump test   Findings  Negative    Comment  (+) symptoms down posterior LLE, but not along pt's original locaion      Prone Knee Bend Test   Findings  Negative      Straight Leg Raise   Findings  Negative      Thomas Test    Findings  Positive    Side  Right;Left      Ober's Test   Findings  Positive    Side  Left    Comments  Rt not tested, reproduced pain over Lt lateral hip      Transfers   Comments  increased time to complete bed mobility; use of UE with sit to stand      Ambulation/Gait   Pre-Gait Activities  ascend 6" steps with Lt step to pattern and SPC; descend with Lt step to pattern              Objective measurements completed on examination: See above findings.              PT Education - 03/26/17 1121    Education provided  Yes    Education Details  eval findings/POC;  differences between disc pain/nerve pain and referred muscle pain    Person(s) Educated  Patient    Methods  Explanation    Comprehension  Verbalized understanding       PT Short Term Goals - 03/26/17 1151      PT SHORT TERM GOAL #1   Title  Therapist will complete FOTO assessment and set appropriate long term goals.     Time  1    Period  Weeks    Status  New    Target Date  04/02/17      PT SHORT TERM GOAL #2    Title  Pt will demo consistency and independence with her HEP to improve flexibility, strength and decrease pain.    Time  4    Period  Weeks    Status  New    Target Date  04/23/17      PT SHORT TERM GOAL #3   Title  Pt will report atleast 25% decrease in her pain from the start of therapy, to increase her activity tolerance at home.     Time  4    Period  Weeks    Status  New        PT Long Term Goals - 03/26/17 1153      PT LONG TERM GOAL #1   Title  Pt will demo improve LE strength to atleast 4/5 MMT which will increase her safety and efficiency with daily activity.    Time  8    Period  Weeks    Status  New    Target Date  05/21/17      PT LONG TERM GOAL #2   Title  Pt will demo improved hip flexibility, evident by her ability to reach hip extension to neutral during thomas testing, to improve her standing posture and alignment.     Time  8    Period  Weeks    Status  New      PT LONG TERM GOAL #3   Title  Pt will demo improved functional stability, evident by her ability to ascend and descend 6" steps with no more than 1 handrail and with reciprocal pattern, x3 trials.     Time  8    Period  Weeks    Status  New      PT LONG TERM GOAL #4   Title  Pt will report atleast 50% reduction in her symptoms from the start of therapy, to allow her to stand and fix a meal with less difficulty.    Time  8    Period  Weeks    Status  New      PT LONG TERM GOAL #5   Title  Pt will demo understanding of her advanced HEP program to prepare for discharge and allow her to maintain progress independently.     Time  8    Period  Weeks    Status  New             Plan - 03/26/17 1148    Clinical Impression Statement  Pt is a pleasant 57 y.o F referred to OPPT with complaints of insidious onset Lt lateral thigh/knee pain in addition to exacerbation of chronic midline low back pain several months ago. She presents with what appears to be mechanical low back with stiffness  and muscle spasm of the area, however there was no change in Lt lateral thigh/knee pain with directional preference testing at this time. Straight leg  raise and slump testing was also negative. Pt's Lt lateral thigh/knee pain was increased with direct palpation to the area, in addition to Ober's test. She has significant weakness of the hip musculature (extensors and abductors specifically). At this time, it appears her Lt LE symptoms are unrelated to her lumbar spine, however this is limiting her activity completion at home. She would benefit from skilled PT to address her limitations in ROM, strength, flexibility and stability to help with functional tasks such as sit to stand/stair negotiation.    History and Personal Factors relevant to plan of care:  history of low back pain; history of cancer (cervical/lymphoma)    Clinical Presentation  Stable    Clinical Presentation due to:  no worsening/improving    Rehab Potential  Good    PT Frequency  2x / week    PT Duration  8 weeks    PT Treatment/Interventions  ADLs/Self Care Home Management;Cryotherapy;Electrical Stimulation;Moist Heat;Iontophoresis 4mg /ml Dexamethasone;Traction;Gait training;Stair training;Therapeutic activities;Therapeutic exercise;Balance training;Patient/family education;Neuromuscular re-education;Manual techniques;Taping;Dry needling;Passive range of motion    PT Next Visit Plan  Needs FOTO; Soft tissue techniques to address lateral aspect of the LLE (hip down to shin); gentle lumbar ROM exercises, joint mobs to lumbar spine; hip flexibility     PT Home Exercise Plan  next session    Consulted and Agree with Plan of Care  Patient       Patient will benefit from skilled therapeutic intervention in order to improve the following deficits and impairments:  Decreased activity tolerance, Difficulty walking, Impaired flexibility, Obesity, Hypomobility, Decreased strength, Decreased range of motion, Postural dysfunction, Increased  muscle spasms, Decreased mobility, Improper body mechanics, Pain  Visit Diagnosis: Chronic midline low back pain, with sciatica presence unspecified - Plan: PT plan of care cert/re-cert  Pain in left hip - Plan: PT plan of care cert/re-cert  Other muscle spasm - Plan: PT plan of care cert/re-cert  Muscle weakness (generalized) - Plan: PT plan of care cert/re-cert     Problem List Patient Active Problem List   Diagnosis Date Noted  . Lymphoma in remission ([facility])     12:12 PM,03/26/17 Elly Modena PT, DPT [facility]  [facility] [address], [place], [place], [address] Phone: [phone]   Fax:  [phone]  Name: Aybree Lanyon MRN: [id number] Date of Birth: Jul 11, 1960

## 2017-03-31 ENCOUNTER — Encounter: Payer: Self-pay | Admitting: Physical Therapy

## 2017-03-31 ENCOUNTER — Ambulatory Visit: Payer: Medicare HMO | Admitting: Physical Therapy

## 2017-03-31 DIAGNOSIS — M62838 Other muscle spasm: Secondary | ICD-10-CM

## 2017-03-31 DIAGNOSIS — M6281 Muscle weakness (generalized): Secondary | ICD-10-CM

## 2017-03-31 DIAGNOSIS — M545 Low back pain: Secondary | ICD-10-CM | POA: Diagnosis not present

## 2017-03-31 DIAGNOSIS — G8929 Other chronic pain: Secondary | ICD-10-CM

## 2017-03-31 DIAGNOSIS — M25552 Pain in left hip: Secondary | ICD-10-CM

## 2017-03-31 NOTE — Patient Instructions (Signed)
   BRIDGING  While lying on your back with knees bent, tighten your lower abdominals, squeeze your buttocks and then raise your buttocks off the floor/bed as creating a "Bridge" with your body. Hold and then lower yourself and repeat.  x10 reps, 2 sets.      STRETCH - PIRIFORMIS  Start in a supine position, one leg bent with foot flat on the bed/floor, and the other leg crossed over the knee.  Gently push the knee of the crossed leg forward until a strong yet pain-free stretch is felt.  Hold for 30 seconds then release.  Repeat as many sets as instructed, then switch sides and repeat 2x each side     SIDELYING CLAMSHELL  While lying on your side with your knees bent, draw up the top knee while keeping contact of your feet together. Place green band around knees.  Do not let your pelvis roll back during the lifting movement.  x10 reps.      ADL - LOG ROLL  GETTING IN BED: Start by sitting on the edge of the bed. Next, lower your self down lying on your side using your arms. Once fully on your side, roll onto your back.  When rolling be sure y our knees stay bent and that you roll your whole body together as one unit. Your shoulders, pelvis and knees all roll as one.    GETTING OUT OF BED: Start by bending your knees and then roll onto your side. Reach your arm across your body to initiate the rolling. When rolling, be sure that you roll your whole body together as one unit. Your shoulders, pelvis and knees should all roll together. Once on our side, tip yourself up to sitting using your arms.        [facility] [address], [place], [place] [address] Phone # [phone] Fax [phone]

## 2017-03-31 NOTE — Therapy (Signed)
[facility] [address] [place], [place], [address] Phone: [phone]   Fax:  [phone]  Physical Therapy Treatment  Patient Details  Name: Tracey Stevens MRN: [id number] Date of Birth: 1960-09-12 Referring Provider: Bernerd Limbo, MD    Encounter Date: 03/31/2017  PT End of Session - 03/31/17 1256    Visit Number  2    Number of Visits  16    Date for PT Re-Evaluation  05/21/17    Authorization Type  [employer]     Authorization Time Period  03/26/17 to 05/21/17    PT Start Time  1231    PT Stop Time  1315    PT Time Calculation (min)  44 min    Activity Tolerance  No increased pain;Patient tolerated treatment well    Behavior During Therapy  [facility] for tasks assessed/performed       Past Medical History:  Diagnosis Date  . Cancer (HCC)    cervical  . Hypertension   . 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 ABDOMINAL HYSTERECTOMY    . BREAST BIOPSY Right    benign    There were no vitals filed for this visit.  Subjective Assessment - 03/31/17 1237    Subjective  Pt reports that things are going well. She has no pain currently and has been completing the exercises as instructed. She did climb her steps with one step over the other without any problem.     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  10 min for the low back    Patient Stated Goals  MRI:     Currently in Pain?  No/denies    Pain Onset  More than a month ago                      [facility] Adult PT Treatment/Exercise - 03/31/17 0001      Self-Care   Self-Care  Posture    Posture  log roll technique      Exercises   Exercises  Lumbar      Lumbar Exercises: Stretches   Single Knee to Chest Stretch  5 reps;10 seconds    Piriformis Stretch  Left;Right;2 reps;30 seconds;Limitations     Piriformis Stretch Limitations  supine figure 4 position       Lumbar Exercises: Supine   Bridge  10 reps    Other Supine Lumbar Exercises  hooklying low trunk rotation x10 reps each direction       Lumbar Exercises: Sidelying   Clam  Both;10 reps    Clam Limitations  green TB      Manual Therapy   Manual Therapy  Soft tissue mobilization    Soft tissue mobilization  IASTM Lt quadriceps/gluteals              PT Education - 03/31/17 1319    Education provided  Yes    Education Details  use of log roll technique; HEP implemented and reviewed    Person(s) Educated  Patient    Methods  Explanation;Handout    Comprehension  Verbalized understanding;Returned demonstration       PT Short Term Goals - 03/31/17 1256      PT SHORT TERM GOAL #1   Title  Therapist will complete FOTO assessment and set appropriate long term  goals.     Time  1    Period  Weeks    Status  Achieved      PT SHORT TERM GOAL #2   Title  Pt will demo consistency and independence with her HEP to improve flexibility, strength and decrease pain.    Time  4    Period  Weeks    Status  On-going      PT SHORT TERM GOAL #3   Title  Pt will report atleast 25% decrease in her pain from the start of therapy, to increase her activity tolerance at home.     Time  4    Period  Weeks    Status  On-going        PT Long Term Goals - 03/31/17 1324      PT LONG TERM GOAL #1   Title  Pt will demo improve LE strength to atleast 4/5 MMT which will increase her safety and efficiency with daily activity.    Time  8    Period  Weeks    Status  New      PT LONG TERM GOAL #2   Title  Pt will demo improved hip flexibility, evident by her ability to reach hip extension to neutral during thomas testing, to improve her standing posture and alignment.     Time  8    Period  Weeks    Status  New      PT LONG TERM GOAL #3   Title  Pt will demo improved functional stability, evident by her ability to ascend and  descend 6" steps with no more than 1 handrail and with reciprocal pattern, x3 trials.     Time  8    Period  Weeks    Status  New      PT LONG TERM GOAL #4   Title  Pt will report atleast 50% reduction in her symptoms from the start of therapy, to allow her to stand and fix a meal with less difficulty.    Time  8    Period  Weeks    Status  New      PT LONG TERM GOAL #5   Title  Pt will demo understanding of her advanced HEP program to prepare for discharge and allow her to maintain progress independently.     Time  8    Period  Weeks    Status  New      Additional Long Term Goals   Additional Long Term Goals  Yes      PT LONG TERM GOAL #6   Title  Pt will score no greater than 43% limitation on FOTO to reflect significant improvements in her function by discharge from PT.    Time  8    Period  Weeks    Status  New            Plan - 03/31/17 1321    Clinical Impression Statement  Pt arrived today without any reports of pain and acknowledging that she has been more conscious about the use of her stairs at home. Session focused on implementing a HEP for the pt to complete at home, and she demonstrated good understanding at this time. Ended with manual treatment to the Lt lateral thigh/glute region with pt reporting feeling like her muscles were more relaxed by the end of the session. FOTO was also obtained and long term goals were updated.    Rehab Potential  Good  PT Frequency  2x / week    PT Duration  8 weeks    PT Treatment/Interventions  ADLs/Self Care Home Management;Cryotherapy;Electrical Stimulation;Moist Heat;Iontophoresis 4mg /ml Dexamethasone;Traction;Gait training;Stair training;Therapeutic activities;Therapeutic exercise;Balance training;Patient/family education;Neuromuscular re-education;Manual techniques;Taping;Dry needling;Passive range of motion    PT Next Visit Plan  conitnue with soft tissue techniques to address lateral aspect of the LLE (hip down to shin);  gentle lumbar ROM exercises, joint mobs to lumbar spine; hip flexibility (hip flexor/hamstring)    PT Home Exercise Plan  side clam with green TB, bridge, SKTC, log roll, piriformis stretch      Consulted and Agree with Plan of Care  Patient       Patient will benefit from skilled therapeutic intervention in order to improve the following deficits and impairments:  Decreased activity tolerance, Difficulty walking, Impaired flexibility, Obesity, Hypomobility, Decreased strength, Decreased range of motion, Postural dysfunction, Increased muscle spasms, Decreased mobility, Improper body mechanics, Pain  Visit Diagnosis: Chronic midline low back pain, with sciatica presence unspecified  Pain in left hip  Other muscle spasm  Muscle weakness (generalized)     Problem List Patient Active Problem List   Diagnosis Date Noted  . Lymphoma in remission ([place])     1:25 PM,03/31/17 Sherol Dade PT, DPT [place] [facility] [place]  [facility] [address], [place], [place], [address] Phone: [phone]   Fax:  [phone]  Name: Tracey Stevens MRN: [id number] Date of Birth: 1961-03-16

## 2017-04-02 ENCOUNTER — Ambulatory Visit: Payer: Medicare HMO | Admitting: Physical Therapy

## 2017-04-02 DIAGNOSIS — M545 Low back pain: Principal | ICD-10-CM

## 2017-04-02 DIAGNOSIS — G8929 Other chronic pain: Secondary | ICD-10-CM

## 2017-04-02 DIAGNOSIS — M25552 Pain in left hip: Secondary | ICD-10-CM

## 2017-04-02 DIAGNOSIS — M62838 Other muscle spasm: Secondary | ICD-10-CM

## 2017-04-02 DIAGNOSIS — M6281 Muscle weakness (generalized): Secondary | ICD-10-CM

## 2017-04-02 NOTE — Therapy (Signed)
[facility] [address] [place], [place], [address] Phone: [phone]   Fax:  [phone]  Physical Therapy Treatment  Patient Details  Name: Tracey Stevens MRN: [id number] Date of Birth: 1961/02/29 Referring Provider: Bernerd Limbo, MD    Encounter Date: 04/02/2017  PT End of Session - 04/02/17 1016    Visit Number  3    Number of Visits  16    Date for PT Re-Evaluation  05/21/17    Authorization Type  [employer]     Authorization Time Period  03/26/17 to 05/21/17    PT Start Time  0930    PT Stop Time  1014    PT Time Calculation (min)  44 min    Activity Tolerance  No increased pain;Patient tolerated treatment well    Behavior During Therapy  [facility] for tasks assessed/performed       Past Medical History:  Diagnosis Date  . Cancer (HCC)    cervical  . Hypertension   . 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 ABDOMINAL HYSTERECTOMY    . BREAST BIOPSY Right    benign    There were no vitals filed for this visit.  Subjective Assessment - 04/02/17 0934    Subjective  Pt reports that she was only a little sore after the massage last session, but this was only for the day after. Otherwise, her exercises are going well.     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  10 min for the low back    Patient Stated Goals  MRI:     Currently in Pain?  No/denies    Pain Onset  More than a month ago                      [facility] Adult PT Treatment/Exercise - 04/02/17 0001      Lumbar Exercises: Aerobic   Nustep  L4 x5 min PT present to discuss HEP/session      Lumbar Exercises: Supine   Bridge  10 reps    Straight Leg Raise  10 reps;Limitations    Straight Leg Raises Limitations  Lt/Rt     Large Ball Oblique Isometric  10 reps;3 seconds;Other (comment) with  red physioball     Other Supine Lumbar Exercises  B hip/knee flexion with LEs on red physioball x15 reps       Lumbar Exercises: Sidelying   Hip Abduction  10 reps;Left    Other Sidelying Lumbar Exercises  thoracic rotation Lt/Rt x15 reps       Manual Therapy   Manual Therapy  Joint mobilization    Joint Mobilization  CPAs lumbar spine Grade 2-3             PT Education - 04/02/17 1016    Education provided  Yes    Education Details  adjustments to bridge HEP; benefits of gentle movement to improve mobility and stiffness in the spine    Person(s) Educated  Patient    Methods  Explanation    Comprehension  Verbalized understanding       PT Short Term Goals - 03/31/17 1256      PT SHORT TERM GOAL #1   Title  Therapist will complete FOTO assessment and set appropriate long term goals.     Time  1    Period  Weeks    Status  Achieved      PT SHORT TERM GOAL #2   Title  Pt will demo consistency and independence with her HEP to improve flexibility, strength and decrease pain.    Time  4    Period  Weeks    Status  On-going      PT SHORT TERM GOAL #3   Title  Pt will report atleast 25% decrease in her pain from the start of therapy, to increase her activity tolerance at home.     Time  4    Period  Weeks    Status  On-going        PT Long Term Goals - 03/31/17 1324      PT LONG TERM GOAL #1   Title  Pt will demo improve LE strength to atleast 4/5 MMT which will increase her safety and efficiency with daily activity.    Time  8    Period  Weeks    Status  New      PT LONG TERM GOAL #2   Title  Pt will demo improved hip flexibility, evident by her ability to reach hip extension to neutral during thomas testing, to improve her standing posture and alignment.     Time  8    Period  Weeks    Status  New      PT LONG TERM GOAL #3   Title  Pt will demo improved functional stability, evident by her ability to ascend and descend 6" steps with no more than 1 handrail  and with reciprocal pattern, x3 trials.     Time  8    Period  Weeks    Status  New      PT LONG TERM GOAL #4   Title  Pt will report atleast 50% reduction in her symptoms from the start of therapy, to allow her to stand and fix a meal with less difficulty.    Time  8    Period  Weeks    Status  New      PT LONG TERM GOAL #5   Title  Pt will demo understanding of her advanced HEP program to prepare for discharge and allow her to maintain progress independently.     Time  8    Period  Weeks    Status  New      Additional Long Term Goals   Additional Long Term Goals  Yes      PT LONG TERM GOAL #6   Title  Pt will score no greater than 43% limitation on FOTO to reflect significant improvements in her function by discharge from PT.    Time  8    Period  Weeks    Status  New            Plan - 04/02/17 1017    Clinical Impression Statement  Pt arrived today reporting consistent HEP adherence following her last session. No increase in pain reported. Session continued with exercise to improve core/hip strength and thoracic/lumbar mobility. Pt did report tenderness with lumbar mobilizations, however there was no increase in pain end of session. Will continue with current POC.    Rehab Potential  Good    PT Frequency  2x / week    PT Duration  8 weeks    PT Treatment/Interventions  ADLs/Self Care Home Management;Cryotherapy;Electrical Stimulation;Moist Heat;Iontophoresis 4mg /ml Dexamethasone;Traction;Gait training;Stair training;Therapeutic activities;Therapeutic exercise;Balance training;Patient/family education;Neuromuscular re-education;Manual techniques;Taping;Dry  needling;Passive range of motion    PT Next Visit Plan  conitnue with soft tissue techniques to address lateral aspect of the LLE (hip down to shin); gentle lumbar ROM exercises,; hip flexibility (hip flexor/hamstring)    PT Home Exercise Plan  side clam with green TB, bridge, SKTC, log roll, piriformis stretch       Consulted and Agree with Plan of Care  Patient       Patient will benefit from skilled therapeutic intervention in order to improve the following deficits and impairments:  Decreased activity tolerance, Difficulty walking, Impaired flexibility, Obesity, Hypomobility, Decreased strength, Decreased range of motion, Postural dysfunction, Increased muscle spasms, Decreased mobility, Improper body mechanics, Pain  Visit Diagnosis: Chronic midline low back pain, with sciatica presence unspecified  Pain in left hip  Other muscle spasm  Muscle weakness (generalized)     Problem List Patient Active Problem List   Diagnosis Date Noted  . Lymphoma in remission ([place])     10:30 AM,04/02/17 Sherol Dade PT, DPT [place] [facility] [place]  [facility] [address], [place], [place], [address] Phone: [phone]   Fax:  [phone]  Name: Tracey Stevens MRN: [id number] Date of Birth: 1960/08/15

## 2017-04-07 ENCOUNTER — Ambulatory Visit: Payer: Medicare HMO | Admitting: Physical Therapy

## 2017-04-07 ENCOUNTER — Encounter: Payer: Self-pay | Admitting: Physical Therapy

## 2017-04-07 DIAGNOSIS — M25552 Pain in left hip: Secondary | ICD-10-CM

## 2017-04-07 DIAGNOSIS — M545 Low back pain: Secondary | ICD-10-CM | POA: Diagnosis not present

## 2017-04-07 DIAGNOSIS — M62838 Other muscle spasm: Secondary | ICD-10-CM

## 2017-04-07 DIAGNOSIS — G8929 Other chronic pain: Secondary | ICD-10-CM

## 2017-04-07 DIAGNOSIS — M6281 Muscle weakness (generalized): Secondary | ICD-10-CM

## 2017-04-07 NOTE — Therapy (Signed)
[facility] [address] [place], [place], [address] Phone: [phone]   Fax:  [phone]  Physical Therapy Treatment  Patient Details  Name: Paulyne Mooty MRN: [id number] Date of Birth: 01-Dec-1960 Referring Provider: Bernerd Limbo, MD    Encounter Date: 04/07/2017  PT End of Session - 04/07/17 1156    Visit Number  4    Number of Visits  16    Date for PT Re-Evaluation  05/21/17    Authorization Type  [employer]     Authorization Time Period  03/26/17 to 05/21/17    PT Start Time  1145    PT Stop Time  1225    PT Time Calculation (min)  40 min    Activity Tolerance  No increased pain;Patient tolerated treatment well    Behavior During Therapy  [facility] for tasks assessed/performed       Past Medical History:  Diagnosis Date  . Cancer (HCC)    cervical  . Hypertension   . 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 ABDOMINAL HYSTERECTOMY    . BREAST BIOPSY Right    benign    There were no vitals filed for this visit.  Subjective Assessment - 04/07/17 1149    Subjective  Pt reports that she is a little sore in her legs, but nothing bad. She had a good appointment with the "back doctor" and says she does not need surgery.     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  10 min for the low back    Patient Stated Goals  MRI:     Currently in Pain?  No/denies    Pain Onset  More than a month ago                      [facility] Adult PT Treatment/Exercise - 04/07/17 0001      Lumbar Exercises: Stretches   Active Hamstring Stretch  5 reps;10 seconds;Left;Right;Limitations    Active Hamstring Stretch Limitations  supine with strap     Hip Flexor Stretch  2 reps;30 seconds;Left;Right;Limitations    Hip Flexor Stretch Limitations  supine thomas test position      Lumbar Exercises: Standing   Other Standing Lumbar Exercises  side stepping along countertop with yellow band down/back, progressed to red TB for 2nd set    Other Standing Lumbar Exercises  hip extension (knee extended) x15 reps each iwth forearms on counter      Lumbar Exercises: Supine   Straight Leg Raise  10 reps    Straight Leg Raises Limitations  Lt/Rt    Other Supine Lumbar Exercises  B knees to chest with LE on red physioball, therapist assisting x20 reps     Other Supine Lumbar Exercises  log roll Lt/Rt in hooklying with UE/LE press into red physioball x10 reps each direction              PT Education - 04/07/17 1311    Education provided  Yes  (Pended)     Education Details  adjustments to HEP  (Pended)     Person(s) Educated  Patient  (Pended)     Methods  Explanation  (Pended)     Comprehension  Verbalized understanding  (Pended)        PT Short Term Goals - 03/31/17  [place] #1   Title  Therapist will complete FOTO assessment and set appropriate long term goals.     Time  1    Period  Weeks    Status  Achieved      PT SHORT TERM GOAL #2   Title  Pt will demo consistency and independence with her HEP to improve flexibility, strength and decrease pain.    Time  4    Period  Weeks    Status  On-going      PT SHORT TERM GOAL #3   Title  Pt will report atleast 25% decrease in her pain from the start of therapy, to increase her activity tolerance at home.     Time  4    Period  Weeks    Status  On-going        PT Long Term Goals - 03/31/17 1324      PT LONG TERM GOAL #1   Title  Pt will demo improve LE strength to atleast 4/5 MMT which will increase her safety and efficiency with daily activity.    Time  8    Period  Weeks    Status  New      PT LONG TERM GOAL #2   Title  Pt will demo improved hip flexibility, evident by her ability to reach hip extension to neutral during thomas testing, to improve her standing posture and  alignment.     Time  8    Period  Weeks    Status  New      PT LONG TERM GOAL #3   Title  Pt will demo improved functional stability, evident by her ability to ascend and descend 6" steps with no more than 1 handrail and with reciprocal pattern, x3 trials.     Time  8    Period  Weeks    Status  New      PT LONG TERM GOAL #4   Title  Pt will report atleast 50% reduction in her symptoms from the start of therapy, to allow her to stand and fix a meal with less difficulty.    Time  8    Period  Weeks    Status  New      PT LONG TERM GOAL #5   Title  Pt will demo understanding of her advanced HEP program to prepare for discharge and allow her to maintain progress independently.     Time  8    Period  Weeks    Status  New      Additional Long Term Goals   Additional Long Term Goals  Yes      PT LONG TERM GOAL #6   Title  Pt will score no greater than 43% limitation on FOTO to reflect significant improvements in her function by discharge from PT.    Time  8    Period  Weeks    Status  New            Plan - 04/07/17 1332    Clinical Impression Statement  Today's session continued with therex to promote trunk and LE strength. Pt did have increased muscle soreness following her last session, however this was resolved upon arrival today. Her HEP is getting easier and therapist was able to make some adjustments this session. Pt verbalized good understanding at this time. Will continue with current POC to improve lumbar mobility, hip flexibility, strength and  decrease pain with daily activity.     Rehab Potential  Good    PT Frequency  2x / week    PT Duration  8 weeks    PT Treatment/Interventions  ADLs/Self Care Home Management;Cryotherapy;Electrical Stimulation;Moist Heat;Iontophoresis 4mg /ml Dexamethasone;Traction;Gait training;Stair training;Therapeutic activities;Therapeutic exercise;Balance training;Patient/family education;Neuromuscular re-education;Manual  techniques;Taping;Dry needling;Passive range of motion    PT Next Visit Plan  conitnue with soft tissue techniques to address lateral aspect of the LLE (hip down to shin) as needed; lumbar ROM exercises,; hip flexibility and strengthening progression    PT Home Exercise Plan  side clam with green TB, bridge, SKTC, log roll, piriformis stretch      Consulted and Agree with Plan of Care  Patient       Patient will benefit from skilled therapeutic intervention in order to improve the following deficits and impairments:  Decreased activity tolerance, Difficulty walking, Impaired flexibility, Obesity, Hypomobility, Decreased strength, Decreased range of motion, Postural dysfunction, Increased muscle spasms, Decreased mobility, Improper body mechanics, Pain  Visit Diagnosis: Chronic midline low back pain, with sciatica presence unspecified  Pain in left hip  Other muscle spasm  Muscle weakness (generalized)     Problem List Patient Active Problem List   Diagnosis Date Noted  . Lymphoma in remission [facility])     1:38 PM,04/07/17 Sherol Dade PT, DPT [place] [facility] [place] [facility] [address] [place], [place], [address] Phone: [phone]   Fax:  [phone]  Name: Shanequa Whitenight MRN: [id number] Date of Birth: [birth year]

## 2017-04-07 NOTE — Patient Instructions (Signed)
   Side Steps: Band around Feet  Place a band around your feet.  With your knees slightly bent and toes pointed forward, step sideways to the end of the countertop and return to the starting position facing the same direction.  **IMPORTANT** Keep tension in the band the entire time by taking a big step with leading foot, then, a smaller step with the trailing foot.  Remember to shift your weight back on the heels of your feet but do not let your toes come off of the ground.   x2-3 times down and back   [facility] [address] [place], [place] [address] Phone # [phone] Fax [phone]

## 2017-04-09 ENCOUNTER — Encounter: Payer: Self-pay | Admitting: Physical Therapy

## 2017-04-09 ENCOUNTER — Ambulatory Visit: Payer: Medicare HMO | Admitting: Physical Therapy

## 2017-04-09 DIAGNOSIS — M6281 Muscle weakness (generalized): Secondary | ICD-10-CM

## 2017-04-09 DIAGNOSIS — M545 Low back pain: Secondary | ICD-10-CM | POA: Diagnosis not present

## 2017-04-09 DIAGNOSIS — G8929 Other chronic pain: Secondary | ICD-10-CM

## 2017-04-09 DIAGNOSIS — M62838 Other muscle spasm: Secondary | ICD-10-CM

## 2017-04-09 DIAGNOSIS — M25552 Pain in left hip: Secondary | ICD-10-CM

## 2017-04-09 NOTE — Therapy (Signed)
[facility] [address] [place], [place], [address] Phone: [phone]   Fax:  [phone]  Physical Therapy Treatment  Patient Details  Name: Tracey Stevens MRN: [id number] Date of Birth: Nov 06, 1960 Referring Provider: Bernerd Limbo, MD    Encounter Date: 04/09/2017  PT End of Session - 04/09/17 1813    Visit Number  5    Number of Visits  16    Date for PT Re-Evaluation  05/21/17    Authorization Type  [employer]     Authorization Time Period  03/26/17 to 05/21/17    PT Start Time  1020    PT Stop Time  1100    PT Time Calculation (min)  40 min    Activity Tolerance  No increased pain;Patient tolerated treatment well    Behavior During Therapy  [facility] for tasks assessed/performed       Past Medical History:  Diagnosis Date  . Cancer (HCC)    cervical  . Hypertension   . 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 ABDOMINAL HYSTERECTOMY    . BREAST BIOPSY Right    benign    There were no vitals filed for this visit.  Subjective Assessment - 04/09/17 1022    Subjective  Pt reports that she is having some issues with transportation, but thinks she will be able to work something out with a friend. Pt has no pain currently and is noticing she is now able to walk more around her house without her cane.     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  10 min for the low back    Patient Stated Goals  MRI:     Currently in Pain?  No/denies    Pain Onset  More than a month ago                      [facility] Adult PT Treatment/Exercise - 04/09/17 0001      Lumbar Exercises: Stretches   Double Knee to Chest Stretch  5 reps;10 seconds;Limitations    Double Knee to Chest Stretch Limitations  with towel assistance.     Hip Flexor Stretch  2 reps;Left;Right    Hip  Flexor Stretch Limitations  supine thomas test position       Lumbar Exercises: Aerobic   Nustep  L3 x7 min therapist present to discuss progress       Lumbar Exercises: Seated   Long Arc Quad on Chair  Both;1 set;20 reps;Weights    LAQ on Chair Weights (lbs)  5    Sit to Stand  10 reps;Other (comment) without UE support       Lumbar Exercises: Supine   Other Supine Lumbar Exercises  B knees to chest with LE on red physioball, therapist assisting x20 reps       Lumbar Exercises: Sidelying   Hip Abduction  Both;10 reps;Weights    Hip Abduction Weights (lbs)  2             PT Education - 04/09/17 1812    Education provided  No       PT Short Term Goals - 03/31/17 1256      PT SHORT TERM GOAL #1   Title  Therapist will complete FOTO assessment and set appropriate long term goals.  Time  1    Period  Weeks    Status  Achieved      PT SHORT TERM GOAL #2   Title  Pt will demo consistency and independence with her HEP to improve flexibility, strength and decrease pain.    Time  4    Period  Weeks    Status  On-going      PT SHORT TERM GOAL #3   Title  Pt will report atleast 25% decrease in her pain from the start of therapy, to increase her activity tolerance at home.     Time  4    Period  Weeks    Status  On-going        PT Long Term Goals - 03/31/17 1324      PT LONG TERM GOAL #1   Title  Pt will demo improve LE strength to atleast 4/5 MMT which will increase her safety and efficiency with daily activity.    Time  8    Period  Weeks    Status  New      PT LONG TERM GOAL #2   Title  Pt will demo improved hip flexibility, evident by her ability to reach hip extension to neutral during thomas testing, to improve her standing posture and alignment.     Time  8    Period  Weeks    Status  New      PT LONG TERM GOAL #3   Title  Pt will demo improved functional stability, evident by her ability to ascend and descend 6" steps with no more than 1 handrail and  with reciprocal pattern, x3 trials.     Time  8    Period  Weeks    Status  New      PT LONG TERM GOAL #4   Title  Pt will report atleast 50% reduction in her symptoms from the start of therapy, to allow her to stand and fix a meal with less difficulty.    Time  8    Period  Weeks    Status  New      PT LONG TERM GOAL #5   Title  Pt will demo understanding of her advanced HEP program to prepare for discharge and allow her to maintain progress independently.     Time  8    Period  Weeks    Status  New      Additional Long Term Goals   Additional Long Term Goals  Yes      PT LONG TERM GOAL #6   Title  Pt will score no greater than 43% limitation on FOTO to reflect significant improvements in her function by discharge from PT.    Time  8    Period  Weeks    Status  New            Plan - 04/09/17 1813    Clinical Impression Statement  Pt is making steady progress towards her goals, noting increase in her ability to ambulate around her home without the need for her SPC. She also notes minimal low back pain, unless weather is bad, despite recent increase in her activity and exercise in therapy. Session continued with hip strengthening activity with pt able to complete increase in resitance without any report of increased LE/low back pain. Pt will continue to benefit from skilled PT to address limitations in flexbility, strength and activity tolerance.     Rehab Potential  Good  PT Frequency  2x / week    PT Duration  8 weeks    PT Treatment/Interventions  ADLs/Self Care Home Management;Cryotherapy;Electrical Stimulation;Moist Heat;Iontophoresis 4mg /ml Dexamethasone;Traction;Gait training;Stair training;Therapeutic activities;Therapeutic exercise;Balance training;Patient/family education;Neuromuscular re-education;Manual techniques;Taping;Dry needling;Passive range of motion    PT Next Visit Plan  continue with soft tissue techniques to address lateral aspect of the LLE (hip down to  shin) as needed; seated lumbar strength/hip strength; lumbar ROM exercises,; hip flexibility and strengthening progression    PT Home Exercise Plan  side clam with green TB, bridge, SKTC, log roll, piriformis stretch      Consulted and Agree with Plan of Care  Patient       Patient will benefit from skilled therapeutic intervention in order to improve the following deficits and impairments:  Decreased activity tolerance, Difficulty walking, Impaired flexibility, Obesity, Hypomobility, Decreased strength, Decreased range of motion, Postural dysfunction, Increased muscle spasms, Decreased mobility, Improper body mechanics, Pain  Visit Diagnosis: Chronic midline low back pain, with sciatica presence unspecified  Pain in left hip  Other muscle spasm  Muscle weakness (generalized)     Problem List Patient Active Problem List   Diagnosis Date Noted  . Lymphoma in remission [facility])     6:28 PM,04/09/17 Sherol Dade PT, DPT [place] [facility] [place]  [facility] [address] [place], [place], [address] Phone: [phone]   Fax:  [phone]  Name: Tracey Stevens MRN: [id number] Date of Birth: 1960-04-06

## 2017-04-14 ENCOUNTER — Encounter: Payer: Self-pay | Admitting: Physical Therapy

## 2017-04-14 ENCOUNTER — Ambulatory Visit: Payer: Medicare HMO | Admitting: Physical Therapy

## 2017-04-14 DIAGNOSIS — M25552 Pain in left hip: Secondary | ICD-10-CM

## 2017-04-14 DIAGNOSIS — M62838 Other muscle spasm: Secondary | ICD-10-CM

## 2017-04-14 DIAGNOSIS — M545 Low back pain: Secondary | ICD-10-CM | POA: Diagnosis not present

## 2017-04-14 DIAGNOSIS — G8929 Other chronic pain: Secondary | ICD-10-CM

## 2017-04-14 DIAGNOSIS — M6281 Muscle weakness (generalized): Secondary | ICD-10-CM

## 2017-04-14 NOTE — Therapy (Signed)
[facility] [address] [place], [place], [address] Phone: [phone]   Fax:  [phone]  Physical Therapy Treatment  Patient Details  Name: Tracey Stevens MRN: [id number] Date of Birth: November 20, 1960 Referring Provider: Bernerd Limbo, MD    Encounter Date: 04/14/2017  PT End of Session - 04/14/17 1239    Visit Number  6    Number of Visits  16    Date for PT Re-Evaluation  05/21/17    Authorization Type  [employer]     Authorization Time Period  03/26/17 to 05/21/17    PT Start Time  1218    PT Stop Time  1300    PT Time Calculation (min)  42 min    Activity Tolerance  No increased pain;Patient tolerated treatment well    Behavior During Therapy  [facility] for tasks assessed/performed       Past Medical History:  Diagnosis Date  . Cancer (HCC)    cervical  . Hypertension   . 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 ABDOMINAL HYSTERECTOMY    . BREAST BIOPSY Right    benign    There were no vitals filed for this visit.  Subjective Assessment - 04/14/17 1218    Subjective  Pt reports that things are settled with her transportation. She continues to complete her HEP. She did have some fatigue in her legs over the weekend when cooking alot for her friend., but this didn't stop her from doing what she had to do.     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  10 min for the low back    Patient Stated Goals  MRI:     Currently in Pain?  No/denies    Pain Onset  More than a month ago                      [facility] Adult PT Treatment/Exercise - 04/14/17 0001      Lumbar Exercises: Aerobic   Nustep  L3 x10 min PT present to discuss progress and pain management at home       Lumbar Exercises: Machines for Strengthening   Leg Press  Seat 9, 100# x15 reps; single  leg 50# x15 reps each      Lumbar Exercises: Seated   Other Seated Lumbar Exercises  thoracic rotation x10 reps each followed by therapist over pressure x5 reps each direction       Lumbar Exercises: Supine   Bent Knee Raise  10 reps;Limitations    Bent Knee Raise Limitations  with UE/LE reach and and knee extension    Other Supine Lumbar Exercises  log roll Lt/Rt in hooklying with UE/LE press into red physioball x10 reps each direction     Other Supine Lumbar Exercises  bent knee hold at 90/90 with alt LE tap             PT Education - 04/14/17 1249    Education provided  Yes    Education Details  benefits of resistance exercise    Person(s) Educated  Patient    Methods  Explanation    Comprehension  Verbalized understanding       PT Short Term Goals - 04/14/17 1244      PT SHORT TERM GOAL #1   Title  Transport planner  will complete FOTO assessment and set appropriate long term goals.     Time  1    Period  Weeks    Status  Achieved      PT SHORT TERM GOAL #2   Title  Pt will demo consistency and independence with her HEP to improve flexibility, strength and decrease pain.    Time  4    Period  Weeks    Status  Achieved      PT SHORT TERM GOAL #3   Title  Pt will report atleast 25% decrease in her pain from the start of therapy, to increase her activity tolerance at home.     Time  4    Period  Weeks    Status  On-going        PT Long Term Goals - 03/31/17 1324      PT LONG TERM GOAL #1   Title  Pt will demo improve LE strength to atleast 4/5 MMT which will increase her safety and efficiency with daily activity.    Time  8    Period  Weeks    Status  New      PT LONG TERM GOAL #2   Title  Pt will demo improved hip flexibility, evident by her ability to reach hip extension to neutral during thomas testing, to improve her standing posture and alignment.     Time  8    Period  Weeks    Status  New      PT LONG TERM GOAL #3   Title  Pt will demo improved  functional stability, evident by her ability to ascend and descend 6" steps with no more than 1 handrail and with reciprocal pattern, x3 trials.     Time  8    Period  Weeks    Status  New      PT LONG TERM GOAL #4   Title  Pt will report atleast 50% reduction in her symptoms from the start of therapy, to allow her to stand and fix a meal with less difficulty.    Time  8    Period  Weeks    Status  New      PT LONG TERM GOAL #5   Title  Pt will demo understanding of her advanced HEP program to prepare for discharge and allow her to maintain progress independently.     Time  8    Period  Weeks    Status  New      Additional Long Term Goals   Additional Long Term Goals  Yes      PT LONG TERM GOAL #6   Title  Pt will score no greater than 43% limitation on FOTO to reflect significant improvements in her function by discharge from PT.    Time  8    Period  Weeks    Status  New            Plan - 04/14/17 1252    Clinical Impression Statement  Pt continues to make steady progress towards goals, reporting improved standing/activity tolerance up to 1 hour this weekend without back or hip pain. She was able to implement periodic rest breaks and stretches as needed to manage her pain. Continued this session with progressing trunk strength/endurance and introduced resistance strengthening for the LEs in order to improve pt's safety and efficiency with daily activity. Pt reported no increase in low back pain or discomfort following today's session.  Rehab Potential  Good    PT Frequency  2x / week    PT Duration  8 weeks    PT Treatment/Interventions  ADLs/Self Care Home Management;Cryotherapy;Electrical Stimulation;Moist Heat;Iontophoresis 4mg /ml Dexamethasone;Traction;Gait training;Stair training;Therapeutic activities;Therapeutic exercise;Balance training;Patient/family education;Neuromuscular re-education;Manual techniques;Taping;Dry needling;Passive range of motion    PT Next Visit  Plan  continue with soft tissue techniques to address lateral aspect of the LLE (hip down to shin) as needed; seated lumbar strength/hip strength; lumbar ROM exercises,; hip flexibility and strengthening progression    PT Home Exercise Plan  side clam with green TB, bridge, SKTC, log roll, piriformis stretch      Consulted and Agree with Plan of Care  Patient       Patient will benefit from skilled therapeutic intervention in order to improve the following deficits and impairments:  Decreased activity tolerance, Difficulty walking, Impaired flexibility, Obesity, Hypomobility, Decreased strength, Decreased range of motion, Postural dysfunction, Increased muscle spasms, Decreased mobility, Improper body mechanics, Pain  Visit Diagnosis: Chronic midline low back pain, with sciatica presence unspecified  Pain in left hip  Other muscle spasm  Muscle weakness (generalized)     Problem List Patient Active Problem List   Diagnosis Date Noted  . Lymphoma in remission ([place])     1:00 PM,04/14/17 Sherol Dade PT, DPT [place] [facility] [place]  [facility] [address], [place], [place], [address] Phone: [phone]   Fax:  [phone]  Name: Tracey Stevens MRN: [id number] Date of Birth: 04/12/1960

## 2017-04-16 ENCOUNTER — Ambulatory Visit: Payer: Medicare HMO | Attending: Family Medicine | Admitting: Physical Therapy

## 2017-04-16 ENCOUNTER — Encounter: Payer: Self-pay | Admitting: Physical Therapy

## 2017-04-16 DIAGNOSIS — M6281 Muscle weakness (generalized): Secondary | ICD-10-CM

## 2017-04-16 DIAGNOSIS — G8929 Other chronic pain: Secondary | ICD-10-CM | POA: Diagnosis present

## 2017-04-16 DIAGNOSIS — M25552 Pain in left hip: Secondary | ICD-10-CM | POA: Diagnosis present

## 2017-04-16 DIAGNOSIS — M62838 Other muscle spasm: Secondary | ICD-10-CM | POA: Diagnosis present

## 2017-04-16 DIAGNOSIS — M545 Low back pain: Secondary | ICD-10-CM | POA: Diagnosis present

## 2017-04-16 NOTE — Therapy (Signed)
[facility] [address], [place], [place], [address] Phone: [phone]   Fax:  [phone]  Physical Therapy Treatment  Patient Details  Name: Tracey Stevens MRN: [id number] Date of Birth: Feb 22, 1961 Referring Provider: Bernerd Limbo, MD    Encounter Date: 04/16/2017  PT End of Session - 04/16/17 1035    Visit Number  7    Number of Visits  16    Date for PT Re-Evaluation  05/21/17    Authorization Type  [employer]     Authorization Time Period  03/26/17 to 05/21/17    PT Start Time  1015    PT Stop Time  1055    PT Time Calculation (min)  40 min    Activity Tolerance  No increased pain;Patient tolerated treatment well    Behavior During Therapy  [facility] for tasks assessed/performed       Past Medical History:  Diagnosis Date  . Cancer (HCC)    cervical  . Hypertension   . 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 ABDOMINAL HYSTERECTOMY    . BREAST BIOPSY Right    benign    There were no vitals filed for this visit.  Subjective Assessment - 04/16/17 1020    Subjective  Pt reports that things continue to go well. She was not sore much after her last session which was a change from previous sessions. No pain currently.    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  10 min for the low back    Patient Stated Goals  MRI:     Currently in Pain?  No/denies    Pain Onset  More than a month ago                      [facility] Adult PT Treatment/Exercise - 04/16/17 0001      Lumbar Exercises: Aerobic   Nustep  intervals x6 min, L1 to L4 every 1 min      Lumbar Exercises: Machines for Strengthening   Leg Press  Seat 9, 120# 2x10 reps      Lumbar Exercises: Seated   Other Seated Lumbar Exercises  seated trunk rotation with red TB x15 reps each direction    Other  Seated Lumbar Exercises  sit to stand on foam pad, x10 reps (no UE support)       Lumbar Exercises: Supine   Other Supine Lumbar Exercises  over soft foam roll: pec stretch with belly breathing 4x15 sec, alt LE marching with UE support on mat x10 reps each     Other Supine Lumbar Exercises  bent knee hold at 90/90 with alt LE tap x6 reps with rest break followed by 4 reps              PT Education - 04/16/17 1034    Education provided  Yes    Education Details  discussed benefits of exercise/movement     Person(s) Educated  Patient    Methods  Explanation    Comprehension  Verbalized understanding       PT Short Term Goals - 04/16/17 1031      PT SHORT TERM GOAL #1   Title  Therapist will complete FOTO assessment and set appropriate long term goals.     Time  1  Period  Weeks    Status  Achieved      PT SHORT TERM GOAL #2   Title  Pt will demo consistency and independence with her HEP to improve flexibility, strength and decrease pain.    Time  4    Period  Weeks    Status  Achieved      PT SHORT TERM GOAL #3   Title  Pt will report atleast 25% decrease in her pain from the start of therapy, to increase her activity tolerance at home.     Baseline  50% improved     Time  4    Period  Weeks    Status  On-going        PT Long Term Goals - 03/31/17 1324      PT LONG TERM GOAL #1   Title  Pt will demo improve LE strength to atleast 4/5 MMT which will increase her safety and efficiency with daily activity.    Time  8    Period  Weeks    Status  New      PT LONG TERM GOAL #2   Title  Pt will demo improved hip flexibility, evident by her ability to reach hip extension to neutral during thomas testing, to improve her standing posture and alignment.     Time  8    Period  Weeks    Status  New      PT LONG TERM GOAL #3   Title  Pt will demo improved functional stability, evident by her ability to ascend and descend 6" steps with no more than 1 handrail and with  reciprocal pattern, x3 trials.     Time  8    Period  Weeks    Status  New      PT LONG TERM GOAL #4   Title  Pt will report atleast 50% reduction in her symptoms from the start of therapy, to allow her to stand and fix a meal with less difficulty.    Time  8    Period  Weeks    Status  New      PT LONG TERM GOAL #5   Title  Pt will demo understanding of her advanced HEP program to prepare for discharge and allow her to maintain progress independently.     Time  8    Period  Weeks    Status  New      Additional Long Term Goals   Additional Long Term Goals  Yes      PT LONG TERM GOAL #6   Title  Pt will score no greater than 43% limitation on FOTO to reflect significant improvements in her function by discharge from PT.    Time  8    Period  Weeks    Status  New            Plan - 04/16/17 1051    Clinical Impression Statement  Pt is making steady progress towards her goals, reporting atleast 50% improvement in her mobility, strength, flexibility and pain since beginning PT. Session continued with therex to promote LE/trunk strength, with the addition of more upright exercises. Pt was able to complete sit to stands on an uneven surface which is a significant improvement from previous sessions which required UE assist. Pt reported no increase in hip/back pain, but noted muscle fatigue end of session.     Rehab Potential  Good    PT Frequency  2x /  week    PT Duration  8 weeks    PT Treatment/Interventions  ADLs/Self Care Home Management;Cryotherapy;Electrical Stimulation;Moist Heat;Iontophoresis 4mg /ml Dexamethasone;Traction;Gait training;Stair training;Therapeutic activities;Therapeutic exercise;Balance training;Patient/family education;Neuromuscular re-education;Manual techniques;Taping;Dry needling;Passive range of motion    PT Next Visit Plan  continue with soft tissue techniques to address lateral aspect of the LLE (hip down to shin) as needed; seated lumbar strength/hip  strength and progress to standing if able; lumbar ROM exercises,; hip flexibility and strengthening progression    PT Home Exercise Plan  update next session    Consulted and Agree with Plan of Care  Patient       Patient will benefit from skilled therapeutic intervention in order to improve the following deficits and impairments:  Decreased activity tolerance, Difficulty walking, Impaired flexibility, Obesity, Hypomobility, Decreased strength, Decreased range of motion, Postural dysfunction, Increased muscle spasms, Decreased mobility, Improper body mechanics, Pain  Visit Diagnosis: Chronic midline low back pain, with sciatica presence unspecified  Pain in left hip  Other muscle spasm  Muscle weakness (generalized)     Problem List Patient Active Problem List   Diagnosis Date Noted  . Lymphoma in remission [facility])     10:53 AM,04/16/17 Sherol Dade PT, DPT [place] [facility] [place]  [facility] [address] [place], [place], [address] Phone: [phone]   Fax:  [phone]  Name: Endia Moncur MRN: [id number] Date of Birth: 02-08-1961

## 2017-04-21 ENCOUNTER — Encounter: Payer: Self-pay | Admitting: Physical Therapy

## 2017-04-21 ENCOUNTER — Ambulatory Visit: Payer: Medicare HMO | Admitting: Physical Therapy

## 2017-04-21 DIAGNOSIS — M545 Low back pain: Secondary | ICD-10-CM | POA: Diagnosis not present

## 2017-04-21 DIAGNOSIS — M25552 Pain in left hip: Secondary | ICD-10-CM

## 2017-04-21 DIAGNOSIS — G8929 Other chronic pain: Secondary | ICD-10-CM

## 2017-04-21 DIAGNOSIS — M6281 Muscle weakness (generalized): Secondary | ICD-10-CM

## 2017-04-21 DIAGNOSIS — M62838 Other muscle spasm: Secondary | ICD-10-CM

## 2017-04-21 NOTE — Therapy (Signed)
[facility] [address] [place], [place], [address] Phone: [phone]   Fax:  [phone]  Physical Therapy Treatment  Patient Details  Name: Tracey Stevens MRN: [id number] Date of Birth: [birth year] Referring Provider: Bernerd Limbo, MD    Encounter Date: 04/21/2017  PT End of Session - 04/21/17 1046    Visit Number  8    Number of Visits  16    Date for PT Re-Evaluation  05/21/17    Authorization Type  [employer]     Authorization Time Period  03/26/17 to 05/21/17    PT Start Time  1016    PT Stop Time  1058    PT Time Calculation (min)  42 min    Activity Tolerance  No increased pain;Patient tolerated treatment well    Behavior During Therapy  [facility] for tasks assessed/performed       Past Medical History:  Diagnosis Date  . Cancer (HCC)    cervical  . Hypertension   . 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 ABDOMINAL HYSTERECTOMY    . BREAST BIOPSY Right    benign    There were no vitals filed for this visit.  Subjective Assessment - 04/21/17 1023    Subjective  Pt reports things are going well. She has no pain currently and states her exercises are going well.     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  10 min for the low back    Patient Stated Goals  MRI:     Currently in Pain?  No/denies    Pain Onset  More than a month ago                      [facility] Adult PT Treatment/Exercise - 04/21/17 0001      Lumbar Exercises: Stretches   Passive Hamstring Stretch  2 reps;Left;Right;30 seconds;Limitations    Passive Hamstring Stretch Limitations  standing with LE on step    Lower Trunk Rotation  2 reps;10 seconds;Limitations    Lower Trunk Rotation Limitations  seated     Gastroc Stretch  Right;Left;3 reps;30 seconds    Gastroc Stretch  Limitations  standing slant board       Lumbar Exercises: Aerobic   Recumbent Bike  intervals every 2 min, L1 to L3, x6 min.       Lumbar Exercises: Machines for Strengthening   Leg Press  seat 9, 125# BLE 2x10 reps (green TB around knees)      Lumbar Exercises: Seated   Other Seated Lumbar Exercises  seated trunk rotation with red TB x15 reps each (elbows bent)       Lumbar Exercises: Supine   Other Supine Lumbar Exercises  over soft foam roll: horizontal abduction with yellow TB x15 reps, D2 flexion with yellow TB x15 reps each              PT Education - 04/21/17 1054    Education provided  Yes    Education Details  noted progress towards goals and improvements in technique with therex completed this session    Person(s) Educated  Patient    Methods  Explanation    Comprehension  Verbalized understanding       PT Short Term Goals - 04/21/17 1047      PT  SHORT TERM GOAL #1   Title  Therapist will complete FOTO assessment and set appropriate long term goals.     Time  1    Period  Weeks    Status  Achieved      PT SHORT TERM GOAL #2   Title  Pt will demo consistency and independence with her HEP to improve flexibility, strength and decrease pain.    Time  4    Period  Weeks    Status  Achieved      PT SHORT TERM GOAL #3   Title  Pt will report atleast 25% decrease in her pain from the start of therapy, to increase her activity tolerance at home.     Baseline  50% improved     Time  4    Period  Weeks    Status  Achieved        PT Long Term Goals - 04/21/17 1047      PT LONG TERM GOAL #1   Title  Pt will demo improve LE strength to atleast 4/5 MMT which will increase her safety and efficiency with daily activity.    Time  8    Period  Weeks    Status  On-going      PT LONG TERM GOAL #2   Title  Pt will demo improved hip flexibility, evident by her ability to reach hip extension to neutral during thomas testing, to improve her standing posture and  alignment.     Time  8    Period  Weeks    Status  On-going      PT LONG TERM GOAL #3   Title  Pt will demo improved functional stability, evident by her ability to ascend and descend 6" steps with no more than 1 handrail and with reciprocal pattern, x3 trials.     Time  8    Period  Weeks    Status  New      PT LONG TERM GOAL #4   Title  Pt will report atleast 50% reduction in her symptoms from the start of therapy, to allow her to stand and fix a meal with less difficulty.    Baseline  50%    Time  8    Period  Weeks    Status  Achieved      PT LONG TERM GOAL #5   Title  Pt will demo understanding of her advanced HEP program to prepare for discharge and allow her to maintain progress independently.     Time  8    Period  Weeks    Status  New      PT LONG TERM GOAL #6   Title  Pt will score no greater than 43% limitation on FOTO to reflect significant improvements in her function by discharge from PT.    Time  8    Period  Weeks    Status  New            Plan - 04/21/17 1055    Clinical Impression Statement  Pt continues to do well in therapy and reports consistent HEP adherence at home. Session focused on promoting trunk strength and stability, noting pt was able to complete supine UE therex without LOB compared to previous sessions. Ended session with overall fatigue and slight increase in low back discomfort which improved with seated stretching. Will continue with current POC to address flexibility, strength and posture limitations.     Rehab Potential  Good    PT Frequency  2x / week    PT Duration  8 weeks    PT Treatment/Interventions  ADLs/Self Care Home Management;Cryotherapy;Electrical Stimulation;Moist Heat;Iontophoresis 4mg /ml Dexamethasone;Traction;Gait training;Stair training;Therapeutic activities;Therapeutic exercise;Balance training;Patient/family education;Neuromuscular re-education;Manual techniques;Taping;Dry needling;Passive range of motion    PT Next  Visit Plan  continue with soft tissue techniques to address lateral aspect of the LLE (hip down to shin) as needed; seated lumbar strength/hip strength and progress to standing if able; lumbar ROM exercises,; hip flexibility and strengthening progression    PT Home Exercise Plan  update next session    Consulted and Agree with Plan of Care  Patient       Patient will benefit from skilled therapeutic intervention in order to improve the following deficits and impairments:  Decreased activity tolerance, Difficulty walking, Impaired flexibility, Obesity, Hypomobility, Decreased strength, Decreased range of motion, Postural dysfunction, Increased muscle spasms, Decreased mobility, Improper body mechanics, Pain  Visit Diagnosis: Chronic midline low back pain, with sciatica presence unspecified  Pain in left hip  Other muscle spasm  Muscle weakness (generalized)     Problem List Patient Active Problem List   Diagnosis Date Noted  . Lymphoma in remission [facility])     11:13 AM,04/21/17 Sherol Dade PT, DPT [place] at [place]  [facility] [address], [place], [place], [address] Phone: [phone]   Fax:  [phone]  Name: Tracey Stevens MRN: [id number] Date of Birth: [birth year]

## 2017-04-23 ENCOUNTER — Ambulatory Visit: Payer: Medicare HMO | Admitting: Physical Therapy

## 2017-04-23 DIAGNOSIS — M62838 Other muscle spasm: Secondary | ICD-10-CM

## 2017-04-23 DIAGNOSIS — G8929 Other chronic pain: Secondary | ICD-10-CM

## 2017-04-23 DIAGNOSIS — M545 Low back pain: Principal | ICD-10-CM

## 2017-04-23 DIAGNOSIS — M6281 Muscle weakness (generalized): Secondary | ICD-10-CM

## 2017-04-23 DIAGNOSIS — M25552 Pain in left hip: Secondary | ICD-10-CM

## 2017-04-23 NOTE — Therapy (Signed)
[facility] [address], [place], [place], [address] Phone: [phone]   Fax:  [phone]  Physical Therapy Treatment  Patient Details  Name: Tracey Stevens MRN: [id number] Date of Birth: Jul 23, 1960 Referring Provider: Bernerd Limbo, MD    Encounter Date: 04/23/2017  PT End of Session - 04/23/17 1104    Visit Number  9    Number of Visits  16    Date for PT Re-Evaluation  05/21/17    Authorization Type  [employer]     Authorization Time Period  03/26/17 to 05/21/17    PT Start Time  1017    PT Stop Time  1059    PT Time Calculation (min)  42 min    Activity Tolerance  No increased pain;Patient tolerated treatment well    Behavior During Therapy  [facility] for tasks assessed/performed       Past Medical History:  Diagnosis Date  . Cancer (HCC)    cervical  . Hypertension   . 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 ABDOMINAL HYSTERECTOMY    . BREAST BIOPSY Right    benign    There were no vitals filed for this visit.  Subjective Assessment - 04/23/17 1133    Subjective  Pt reports that she is walking more around the house without her cane which she enjoys. No pain currently.     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  10 min for the low back    Patient Stated Goals  MRI:     Currently in Pain?  No/denies    Pain Onset  --                      [facility] Adult PT Treatment/Exercise - 04/23/17 0001      Lumbar Exercises: Stretches   Lower Trunk Rotation  5 reps;10 seconds    Lower Trunk Rotation Limitations  supine each side      Lumbar Exercises: Machines for Strengthening   Leg Press  seat 9, 130# BLE 2x15 reps       Lumbar Exercises: Standing   Other Standing Lumbar Exercises  forward step up (4") with contralateral knee drive and 1 UE support  2x10 reps each      Lumbar Exercises: Seated   Other Seated Lumbar Exercises  seated on green physioball with knee extension and intermittent UE support x10 reps each       Lumbar Exercises: Supine   Other Supine Lumbar Exercises  B knees to chest with LE on green physioball and over pressure into end range hip flexion x15 reps              PT Education - 04/23/17 1104    Education provided  Yes    Education Details  discussed implications for exercises.    Person(s) Educated  Patient    Methods  Explanation    Comprehension  Verbalized understanding       PT Short Term Goals - 04/21/17 1047      PT SHORT TERM GOAL #1   Title  Therapist will complete FOTO assessment and set appropriate long term goals.     Time  1    Period  Weeks    Status  Achieved      PT SHORT TERM GOAL #  2   Title  Pt will demo consistency and independence with her HEP to improve flexibility, strength and decrease pain.    Time  4    Period  Weeks    Status  Achieved      PT SHORT TERM GOAL #3   Title  Pt will report atleast 25% decrease in her pain from the start of therapy, to increase her activity tolerance at home.     Baseline  50% improved     Time  4    Period  Weeks    Status  Achieved        PT Long Term Goals - 04/21/17 1047      PT LONG TERM GOAL #1   Title  Pt will demo improve LE strength to atleast 4/5 MMT which will increase her safety and efficiency with daily activity.    Time  8    Period  Weeks    Status  On-going      PT LONG TERM GOAL #2   Title  Pt will demo improved hip flexibility, evident by her ability to reach hip extension to neutral during thomas testing, to improve her standing posture and alignment.     Time  8    Period  Weeks    Status  On-going      PT LONG TERM GOAL #3   Title  Pt will demo improved functional stability, evident by her ability to ascend and descend 6" steps with no more than 1 handrail and with reciprocal pattern, x3 trials.      Time  8    Period  Weeks    Status  New      PT LONG TERM GOAL #4   Title  Pt will report atleast 50% reduction in her symptoms from the start of therapy, to allow her to stand and fix a meal with less difficulty.    Baseline  50%    Time  8    Period  Weeks    Status  Achieved      PT LONG TERM GOAL #5   Title  Pt will demo understanding of her advanced HEP program to prepare for discharge and allow her to maintain progress independently.     Time  8    Period  Weeks    Status  New      PT LONG TERM GOAL #6   Title  Pt will score no greater than 43% limitation on FOTO to reflect significant improvements in her function by discharge from PT.    Time  8    Period  Weeks    Status  New            Plan - 04/23/17 1105    Clinical Impression Statement  Pt was able to increase resistance with leg press this session. Pt does continue to note mild low back ache with activity, and therapist encouraged pt to use stretching program when having increased discomfort to further improve her symptoms. Pt verbalized understanding of this. Pt was able to complete all of today's exercises without any report of increased back pain, although she did have increased difficulty with seated stability exercises on the exercise ball, requiring atleast 1 hand assistance. Will continue with current POC to progress LE/trunk strength and stability to improve activity participation and endurance.     Rehab Potential  Good    PT Frequency  2x / week    PT Duration  8  weeks    PT Treatment/Interventions  ADLs/Self Care Home Management;Cryotherapy;Electrical Stimulation;Moist Heat;Iontophoresis 4mg /ml Dexamethasone;Traction;Gait training;Stair training;Therapeutic activities;Therapeutic exercise;Balance training;Patient/family education;Neuromuscular re-education;Manual techniques;Taping;Dry needling;Passive range of motion    PT Next Visit Plan  continue with soft tissue techniques to address lateral aspect of  the LLE (hip down to shin) as needed; seated lumbar strength/hip strength and progress to standing if able; lumbar ROM exercises,; hip flexibility and strengthening progression    PT Home Exercise Plan  update next session    Consulted and Agree with Plan of Care  Patient       Patient will benefit from skilled therapeutic intervention in order to improve the following deficits and impairments:  Decreased activity tolerance, Difficulty walking, Impaired flexibility, Obesity, Hypomobility, Decreased strength, Decreased range of motion, Postural dysfunction, Increased muscle spasms, Decreased mobility, Improper body mechanics, Pain  Visit Diagnosis: Chronic midline low back pain, with sciatica presence unspecified  Pain in left hip  Other muscle spasm  Muscle weakness (generalized)     Problem List Patient Active Problem List   Diagnosis Date Noted  . Lymphoma in remission [facility])     11:47 AM,04/23/17 Sherol Dade PT, DPT [place] at [place]  [facility] [address] [place], [place], [address] Phone: [phone]   Fax:  [phone]  Name: Tracey Stevens MRN: [id number] Date of Birth: [birth year]

## 2017-04-28 ENCOUNTER — Encounter: Payer: Self-pay | Admitting: Physical Therapy

## 2017-04-28 ENCOUNTER — Ambulatory Visit: Payer: Medicare HMO | Admitting: Physical Therapy

## 2017-04-28 DIAGNOSIS — M545 Low back pain: Principal | ICD-10-CM

## 2017-04-28 DIAGNOSIS — M25552 Pain in left hip: Secondary | ICD-10-CM

## 2017-04-28 DIAGNOSIS — M6281 Muscle weakness (generalized): Secondary | ICD-10-CM

## 2017-04-28 DIAGNOSIS — G8929 Other chronic pain: Secondary | ICD-10-CM

## 2017-04-28 DIAGNOSIS — M62838 Other muscle spasm: Secondary | ICD-10-CM

## 2017-04-28 NOTE — Therapy (Signed)
[facility] [address] [place], [place], [address] Phone: [phone]   Fax:  [phone]  Physical Therapy Treatment  Patient Details  Name: Tracey Stevens MRN: [id number] Date of Birth: 25-Feb-1961 Referring Provider: Bernerd Limbo, MD    Encounter Date: 04/28/2017  PT End of Session - 04/28/17 1028    Visit Number  10    Number of Visits  16    Date for PT Re-Evaluation  05/21/17    Authorization Type  [employer]     Authorization Time Period  03/26/17 to 05/21/17    PT Start Time  1017    PT Stop Time  1058    PT Time Calculation (min)  41 min    Activity Tolerance  No increased pain;Patient tolerated treatment well    Behavior During Therapy  [facility] for tasks assessed/performed       Past Medical History:  Diagnosis Date  . Cancer (HCC)    cervical  . Hypertension   . 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 ABDOMINAL HYSTERECTOMY    . BREAST BIOPSY Right    benign    There were no vitals filed for this visit.  Subjective Assessment - 04/28/17 1022    Subjective  Pt reports that things are going well. She had a little bit of back discomfort this morning when she woke up, but currently she is feeling good. She states the pain worked itself out as she completed more of her morning routine. Exercises are still going well.     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  10 min for the low back    Patient Stated Goals  MRI:     Currently in Pain?  No/denies            [facility] Adult PT Treatment/Exercise - 04/28/17 0001      Lumbar Exercises: Stretches   Hip Flexor Stretch  Left;Right;3 reps;30 seconds    Hip Flexor Stretch Limitations  thomas position with towel      Lumbar Exercises: Aerobic   Stationary Bike  L2 x5 min, therapist present to discuss session        Lumbar Exercises: Standing   Other Standing Lumbar Exercises  hip extension with yellow TB     Other Standing Lumbar Exercises  forward step up onto 6" box with 1 UE support and contralateral knee drive C78 reps each       Lumbar Exercises: Seated   Other Seated Lumbar Exercises  rows with green TB 2x10 reps, PT cuing to decrease upper trap/shrugging       Lumbar Exercises: Supine   Other Supine Lumbar Exercises  low trunk rotation Lt and Rt x10 reps each     Other Supine Lumbar Exercises  B knees to chest with LE on green physioball and over pressure into end range hip flexion x20 reps              PT Education - 04/28/17 1050    Education provided  Yes    Education Details  HEP additions     Person(s) Educated  Patient    Methods  Handout;Verbal cues;Explanation    Comprehension  Returned demonstration;Verbalized understanding       PT Short Term Goals - 04/21/17 1047      PT SHORT  TERM GOAL #1   Title  Therapist will complete FOTO assessment and set appropriate long term goals.     Time  1    Period  Weeks    Status  Achieved      PT SHORT TERM GOAL #2   Title  Pt will demo consistency and independence with her HEP to improve flexibility, strength and decrease pain.    Time  4    Period  Weeks    Status  Achieved      PT SHORT TERM GOAL #3   Title  Pt will report atleast 25% decrease in her pain from the start of therapy, to increase her activity tolerance at home.     Baseline  50% improved     Time  4    Period  Weeks    Status  Achieved        PT Long Term Goals - 04/21/17 1047      PT LONG TERM GOAL #1   Title  Pt will demo improve LE strength to atleast 4/5 MMT which will increase her safety and efficiency with daily activity.    Time  8    Period  Weeks    Status  On-going      PT LONG TERM GOAL #2   Title  Pt will demo improved hip flexibility, evident by her ability to reach hip extension to neutral during thomas testing, to improve her  standing posture and alignment.     Time  8    Period  Weeks    Status  On-going      PT LONG TERM GOAL #3   Title  Pt will demo improved functional stability, evident by her ability to ascend and descend 6" steps with no more than 1 handrail and with reciprocal pattern, x3 trials.     Time  8    Period  Weeks    Status  New      PT LONG TERM GOAL #4   Title  Pt will report atleast 50% reduction in her symptoms from the start of therapy, to allow her to stand and fix a meal with less difficulty.    Baseline  50%    Time  8    Period  Weeks    Status  Achieved      PT LONG TERM GOAL #5   Title  Pt will demo understanding of her advanced HEP program to prepare for discharge and allow her to maintain progress independently.     Time  8    Period  Weeks    Status  New      PT LONG TERM GOAL #6   Title  Pt will score no greater than 43% limitation on FOTO to reflect significant improvements in her function by discharge from PT.    Time  8    Period  Weeks    Status  New            Plan - 04/28/17 1105    Clinical Impression Statement  Pt arrived without report of low back or LE pain. Session continued with focus on therex progressions and flexibility of the hip/low back. Pt was able to complete step ups onto a 6" box this session without increased difficulty. Therapist made several adjustments to pt's HEP to reflect improvements in strength and she demonstrated good understanding following today's session. Will continue with current POC to address strength, ROM, and pain with activity.  Rehab Potential  Good    PT Frequency  2x / week    PT Duration  8 weeks    PT Treatment/Interventions  ADLs/Self Care Home Management;Cryotherapy;Electrical Stimulation;Moist Heat;Iontophoresis 4mg /ml Dexamethasone;Traction;Gait training;Stair training;Therapeutic activities;Therapeutic exercise;Balance training;Patient/family education;Neuromuscular re-education;Manual techniques;Taping;Dry  needling;Passive range of motion    PT Next Visit Plan  continue with soft tissue techniques to address lateral aspect of the LLE (hip down to shin) as needed; seated and standing lumbar strength/hip strength; lumbar ROM exercises,; hip flexibility and strengthening progression    PT Home Exercise Plan  supine hip flex stretch, seated rows (green band), standing hip ext with yellow band     Consulted and Agree with Plan of Care  Patient       Patient will benefit from skilled therapeutic intervention in order to improve the following deficits and impairments:  Decreased activity tolerance, Difficulty walking, Impaired flexibility, Obesity, Hypomobility, Decreased strength, Decreased range of motion, Postural dysfunction, Increased muscle spasms, Decreased mobility, Improper body mechanics, Pain  Visit Diagnosis: Chronic midline low back pain, with sciatica presence unspecified  Pain in left hip  Muscle weakness (generalized)  Other muscle spasm     Problem List Patient Active Problem List   Diagnosis Date Noted  . Lymphoma in remission [facility])     11:11 AM,04/28/17 Sherol Dade PT, DPT [place] [facility] [place] [facility] [address] [place], [place], [address] Phone: [phone]   Fax:  [phone]  Name: Dannae Kato MRN: [id number] Date of Birth: [birth year]

## 2017-04-28 NOTE — Patient Instructions (Addendum)
   LOOPED ELASTIC BAND HIP EXTENSION  While standing with an elastic band looped around your ankles, move the target leg back as shown.   Keep your knees straight the entire time. Using yellow band.  x10 reps on each leg.     Rows with Theraband  Standing with feet hip width apart, stomach drawn in and glute muscles tight. Start with arms extended in front of you. Pull shoulder blades down and back and then pull elbows in towards body. Slowly return to starting position.     Using green band, complete 15 reps. 2 sets     Supine Hip Flexor Stretch  Lie on the back with the affected leg hanging off the table. Then pull the unaffected leg towards your body for a greater stretch (you can use a towel to support the leg) and to support the lower back.   If the foot of the hanging affected leg touches the ground, bend the knee and plant/scoot the foot back until you feel a stretch on the top of the thigh.    Hold 30 sec, repeat 2x, then switch sides.     *Continue to complete stretches in bed as needed    [facility] [address] [place], [place] [address] Phone # [phone] Fax [phone]

## 2017-04-30 ENCOUNTER — Encounter: Payer: Self-pay | Admitting: Physical Therapy

## 2017-04-30 ENCOUNTER — Ambulatory Visit: Payer: Medicare HMO | Admitting: Physical Therapy

## 2017-04-30 DIAGNOSIS — M25552 Pain in left hip: Secondary | ICD-10-CM

## 2017-04-30 DIAGNOSIS — M6281 Muscle weakness (generalized): Secondary | ICD-10-CM

## 2017-04-30 DIAGNOSIS — M545 Low back pain: Secondary | ICD-10-CM | POA: Diagnosis not present

## 2017-04-30 DIAGNOSIS — M62838 Other muscle spasm: Secondary | ICD-10-CM

## 2017-04-30 DIAGNOSIS — G8929 Other chronic pain: Secondary | ICD-10-CM

## 2017-04-30 NOTE — Therapy (Signed)
[facility] [address] [place], [place], [address] Phone: [phone]   Fax:  [phone]  Physical Therapy Treatment  Patient Details  Name: Tracey Stevens MRN: [id number] Date of Birth: 01/19/1961 Referring Provider: Bernerd Limbo, MD    Encounter Date: 04/30/2017  PT End of Session - 04/30/17 1102    Visit Number  11    Number of Visits  16    Date for PT Re-Evaluation  05/21/17    Authorization Type  [employer]     Authorization Time Period  03/26/17 to 05/21/17    PT Start Time  1015    PT Stop Time  1058    PT Time Calculation (min)  43 min    Activity Tolerance  No increased pain;Patient tolerated treatment well    Behavior During Therapy  [facility] for tasks assessed/performed       Past Medical History:  Diagnosis Date  . Cancer (HCC)    cervical  . Hypertension   . 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 ABDOMINAL HYSTERECTOMY    . BREAST BIOPSY Right    benign    There were no vitals filed for this visit.  Subjective Assessment - 04/30/17 1019    Subjective  Pt reports things are going well. She completed her new exercises and her legs are a little sore. No pain otherwise.     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  10 min for the low back    Patient Stated Goals  MRI:     Currently in Pain?  No/denies         [facility] PT Assessment - 04/30/17 0001      Strength   Right Hip Extension  3/5    Right Hip ABduction  4/5    Left Hip Extension  5/5    Left Hip ABduction  4/5                  [facility] Adult PT Treatment/Exercise - 04/30/17 0001      Lumbar Exercises: Stretches   Other Lumbar Stretch Exercise  gluteal stretch 5x15 sec each side    Other Lumbar Stretch Exercise  Standing hip flexor stretc 2x30 sec (lunge)      Lumbar  Exercises: Aerobic   Nustep  1 minute intervals x8 min, L1 to L5  PT present to discuss session and adjust resistance      Lumbar Exercises: Standing   Other Standing Lumbar Exercises  standing hamstring curls with 2# weights x10 reps each       Lumbar Exercises: Seated   Other Seated Lumbar Exercises  abdominal bracing with single leg march 2x10 reps each       Lumbar Exercises: Supine   Bridge  10 reps;Limitations    Bridge Limitations  x3 sets       Lumbar Exercises: Sidelying   Hip Abduction  Both;10 reps;Weights;Limitations    Hip Abduction Weights (lbs)  2    Hip Abduction Limitations  therapist cuing to decreased hip flexion compensation              PT Education - 04/30/17 1101    Education provided  Yes    Education Details  progress towards goals    Person(s) Educated  Patient    Methods  Explanation  Comprehension  Verbalized understanding       PT Short Term Goals - 04/21/17 1047      PT SHORT TERM GOAL #1   Title  Therapist will complete FOTO assessment and set appropriate long term goals.     Time  1    Period  Weeks    Status  Achieved      PT SHORT TERM GOAL #2   Title  Pt will demo consistency and independence with her HEP to improve flexibility, strength and decrease pain.    Time  4    Period  Weeks    Status  Achieved      PT SHORT TERM GOAL #3   Title  Pt will report atleast 25% decrease in her pain from the start of therapy, to increase her activity tolerance at home.     Baseline  50% improved     Time  4    Period  Weeks    Status  Achieved        PT Long Term Goals - 04/30/17 1059      PT LONG TERM GOAL #1   Title  Pt will demo improve LE strength to atleast 4/5 MMT which will increase her safety and efficiency with daily activity.    Baseline  all except Rt hip extension 3/5    Time  8    Period  Weeks    Status  Partially Met      PT LONG TERM GOAL #2   Title  Pt will demo improved hip flexibility, evident by her  ability to reach hip extension to neutral during thomas testing, to improve her standing posture and alignment.     Time  8    Period  Weeks    Status  On-going      PT LONG TERM GOAL #3   Title  Pt will demo improved functional stability, evident by her ability to ascend and descend 6" steps with no more than 1 handrail and with reciprocal pattern, x3 trials.     Time  8    Period  Weeks    Status  Achieved      PT LONG TERM GOAL #4   Title  Pt will report atleast 50% reduction in her symptoms from the start of therapy, to allow her to stand and fix a meal with less difficulty.    Baseline  50%    Time  8    Period  Weeks    Status  Achieved      PT LONG TERM GOAL #5   Title  Pt will demo understanding of her advanced HEP program to prepare for discharge and allow her to maintain progress independently.     Time  8    Period  Weeks    Status  New      PT LONG TERM GOAL #6   Title  Pt will score no greater than 43% limitation on FOTO to reflect significant improvements in her function by discharge from PT.    Time  8    Period  Weeks    Status  New            Plan - 04/30/17 1052    Clinical Impression Statement  Pt is making steady progress towards her goals, demonstrating improved BLE strength this session with MMT. Although improved, she does continue to have 4/5 MMT of the Rt hip abductors. She is now able to ascend and descend  steps with handrails and reciprocal pattern without pain which is an improvement from her evaluation. Continued with therex to further address hip strength and flexibility, noting end ranges of hip extension are somewhat difficult to attain and requires therapist cuing to address posture compensations. Pt reported no increase in pain or buttock pain end of session, will continue with current POC.     Rehab Potential  Good    PT Frequency  2x / week    PT Duration  8 weeks    PT Treatment/Interventions  ADLs/Self Care Home  Management;Cryotherapy;Electrical Stimulation;Moist Heat;Iontophoresis 68m/ml Dexamethasone;Traction;Gait tScientist, forensicTherapeutic activities;Therapeutic exercise;Balance training;Patient/family education;Neuromuscular re-education;Manual techniques;Taping;Dry needling;Passive range of motion    PT Next Visit Plan  continue with soft tissue techniques to address lateral aspect of the LLE (hip down to shin) as needed; seated and standing lumbar strength/hip strength; lumbar ROM exercises,; hip flexibility and strengthening progression    PT Home Exercise Plan  supine hip flex stretch, seated rows (green band), standing hip ext with yellow band     Consulted and Agree with Plan of Care  Patient       Patient will benefit from skilled therapeutic intervention in order to improve the following deficits and impairments:  Decreased activity tolerance, Difficulty walking, Impaired flexibility, Obesity, Hypomobility, Decreased strength, Decreased range of motion, Postural dysfunction, Increased muscle spasms, Decreased mobility, Improper body mechanics, Pain  Visit Diagnosis: Chronic midline low back pain, with sciatica presence unspecified  Pain in left hip  Muscle weakness (generalized)  Other muscle spasm     Problem List Patient Active Problem List   Diagnosis Date Noted  . Lymphoma in remission ([facility]     11:02 AM,04/30/17 SSherol DadePT, DPT [facility]erve[facility] [address] [place] [address]Phone: [phone]  Fax:  [phone] Name: LRuthellen TippyMRN: [id number] of Birth: [birth year]

## 2017-05-05 ENCOUNTER — Ambulatory Visit: Payer: Medicare HMO | Admitting: Physical Therapy

## 2017-05-05 DIAGNOSIS — M25552 Pain in left hip: Secondary | ICD-10-CM

## 2017-05-05 DIAGNOSIS — M6281 Muscle weakness (generalized): Secondary | ICD-10-CM

## 2017-05-05 DIAGNOSIS — M62838 Other muscle spasm: Secondary | ICD-10-CM

## 2017-05-05 DIAGNOSIS — M545 Low back pain: Secondary | ICD-10-CM | POA: Diagnosis not present

## 2017-05-05 DIAGNOSIS — G8929 Other chronic pain: Secondary | ICD-10-CM

## 2017-05-05 NOTE — Therapy (Signed)
[facility] [address] [place], [place], [address] Phone: [phone]   Fax:  [phone]  Physical Therapy Treatment  Patient Details  Name: Tracey Stevens MRN: [id number] Date of Birth: 1961-01-08 Referring Provider: Bernerd Limbo, MD    Encounter Date: 05/05/2017  PT End of Session - 05/05/17 1103    Visit Number  12    Number of Visits  16    Date for PT Re-Evaluation  05/21/17    Authorization Type  [employer]     Authorization Time Period  03/26/17 to 05/21/17    PT Start Time  1016    PT Stop Time  1059    PT Time Calculation (min)  43 min    Activity Tolerance  No increased pain;Patient tolerated treatment well    Behavior During Therapy  [facility] for tasks assessed/performed       Past Medical History:  Diagnosis Date  . Cancer (HCC)    cervical  . Hypertension   . 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 ABDOMINAL HYSTERECTOMY    . BREAST BIOPSY Right    benign    There were no vitals filed for this visit.  Subjective Assessment - 05/05/17 1020    Subjective  Pt reports that her Lt hip is better, noting that she does not have the pain down to the shin anymore. She will notice pain on the Lt hip when she sleeps on that side. Otherwise, things are going well.     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  10 min for the low back    Patient Stated Goals  MRI:     Currently in Pain?  No/denies                      [facility] Adult PT Treatment/Exercise - 05/05/17 0001      Lumbar Exercises: Aerobic   Stationary Bike  L4 x5 min       Lumbar Exercises: Standing   Other Standing Lumbar Exercises  standing hamstring curls with 2# weights x15 reps each    Other Standing Lumbar Exercises  forward step up onto 6" box with 1 UE support and contralateral  knee drive H29 reps each       Lumbar Exercises: Supine   Other Supine Lumbar Exercises  Alt bent knee lower from 90/90 position 3x5 reps, cues to encourage maintained abdominal activation      Lumbar Exercises: Sidelying   Clam  15 reps;Both    Clam Limitations  blue TB      Manual Therapy   Soft tissue mobilization  STM Lt gluteals, lateral quadriceps/ITB             PT Education - 05/05/17 1103    Education provided  Yes    Education Details  technique with therex; using pillow between the knees at night to decrease pain in Lt hip     Person(s) Educated  Patient    Methods  Explanation;Verbal cues;Tactile cues    Comprehension  Verbalized understanding;Returned demonstration       PT Short Term Goals - 04/21/17 1047      PT SHORT TERM GOAL #1   Title  Therapist will complete FOTO assessment and set appropriate long term goals.     Time  1    Period  Weeks    Status  Achieved      PT SHORT TERM GOAL #2   Title  Pt will demo consistency and independence with her HEP to improve flexibility, strength and decrease pain.    Time  4    Period  Weeks    Status  Achieved      PT SHORT TERM GOAL #3   Title  Pt will report atleast 25% decrease in her pain from the start of therapy, to increase her activity tolerance at home.     Baseline  50% improved     Time  4    Period  Weeks    Status  Achieved        PT Long Term Goals - 04/30/17 1059      PT LONG TERM GOAL #1   Title  Pt will demo improve LE strength to atleast 4/5 MMT which will increase her safety and efficiency with daily activity.    Baseline  all except Rt hip extension 3/5    Time  8    Period  Weeks    Status  Partially Met      PT LONG TERM GOAL #2   Title  Pt will demo improved hip flexibility, evident by her ability to reach hip extension to neutral during thomas testing, to improve her standing posture and alignment.     Time  8    Period  Weeks    Status  On-going      PT LONG TERM GOAL  #3   Title  Pt will demo improved functional stability, evident by her ability to ascend and descend 6" steps with no more than 1 handrail and with reciprocal pattern, x3 trials.     Time  8    Period  Weeks    Status  Achieved      PT LONG TERM GOAL #4   Title  Pt will report atleast 50% reduction in her symptoms from the start of therapy, to allow her to stand and fix a meal with less difficulty.    Baseline  50%    Time  8    Period  Weeks    Status  Achieved      PT LONG TERM GOAL #5   Title  Pt will demo understanding of her advanced HEP program to prepare for discharge and allow her to maintain progress independently.     Time  8    Period  Weeks    Status  New      PT LONG TERM GOAL #6   Title  Pt will score no greater than 43% limitation on FOTO to reflect significant improvements in her function by discharge from PT.    Time  8    Period  Weeks    Status  New            Plan - 05/05/17 1104    Clinical Impression Statement  Continued this session with therex to improve hip strength and endurance. Increased resistance and reps with several exercises without much difficulty noted. Pt still requires therapist cuing for technique with higher level exercises addressing abdominal strength and endurance. Also completed manual techniques to the Lt hip region to decrease soft tissue restrictions and decrease pain throughout the day. Ended session with fatigue, but no reported pain.     Rehab Potential  Good    PT Frequency  2x / week  PT Duration  8 weeks    PT Treatment/Interventions  ADLs/Self Care Home Management;Cryotherapy;Electrical Stimulation;Moist Heat;Iontophoresis 63m/ml Dexamethasone;Traction;Gait tScientist, forensicTherapeutic activities;Therapeutic exercise;Balance training;Patient/family education;Neuromuscular re-education;Manual techniques;Taping;Dry needling;Passive range of motion    PT Next Visit Plan  f/u with pillow between the knees; continue with  soft tissue techniques to address lateral aspect of the LLE (hip down to knee) as needed; seated and standing lumbar strength/hip strength; lumbar ROM exercises,; hip extensor strength    PT Home Exercise Plan  supine hip flex stretch, seated rows (green band), standing hip ext with yellow band     Consulted and Agree with Plan of Care  Patient       Patient will benefit from skilled therapeutic intervention in order to improve the following deficits and impairments:  Decreased activity tolerance, Difficulty walking, Impaired flexibility, Obesity, Hypomobility, Decreased strength, Decreased range of motion, Postural dysfunction, Increased muscle spasms, Decreased mobility, Improper body mechanics, Pain  Visit Diagnosis: Chronic midline low back pain, with sciatica presence unspecified  Pain in left hip  Muscle weakness (generalized)  Other muscle spasm     Problem List Patient Active Problem List   Diagnosis Date Noted  . Lymphoma in remission ([facility]     11:09 AM,05/05/17 SSherol DadePT, DPT CEd[facility] Endave[facility] [facility] [address] [place] [address]Phone: [phone]  Fax:  [phone] Name: Tracey EtheridgeMRN: [id number] of Birth: [birth year]

## 2017-05-07 ENCOUNTER — Ambulatory Visit: Payer: Medicare HMO | Admitting: Physical Therapy

## 2017-05-07 ENCOUNTER — Encounter: Payer: Self-pay | Admitting: Physical Therapy

## 2017-05-07 DIAGNOSIS — M25552 Pain in left hip: Secondary | ICD-10-CM

## 2017-05-07 DIAGNOSIS — M545 Low back pain: Principal | ICD-10-CM

## 2017-05-07 DIAGNOSIS — M62838 Other muscle spasm: Secondary | ICD-10-CM

## 2017-05-07 DIAGNOSIS — G8929 Other chronic pain: Secondary | ICD-10-CM

## 2017-05-07 DIAGNOSIS — M6281 Muscle weakness (generalized): Secondary | ICD-10-CM

## 2017-05-07 NOTE — Therapy (Signed)
[facility] [address] [place], [place], [address] Phone: [phone]   Fax:  [phone]  Physical Therapy Treatment  Patient Details  Name: Tracey Stevens MRN: [id number] Date of Birth: [birth year] Referring Provider: Bernerd Limbo, MD    Encounter Date: 05/07/2017  PT End of Session - 05/07/17 1055    Visit Number  13    Number of Visits  16    Date for PT Re-Evaluation  05/21/17    Authorization Type  [employer]     Authorization Time Period  03/26/17 to 05/21/17    PT Start Time  1015    PT Stop Time  1057    PT Time Calculation (min)  42 min    Activity Tolerance  No increased pain;Patient tolerated treatment well    Behavior During Therapy  [facility] for tasks assessed/performed       Past Medical History:  Diagnosis Date  . Cancer (HCC)    cervical  . Hypertension   . 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 ABDOMINAL HYSTERECTOMY    . BREAST BIOPSY Right    benign    There were no vitals filed for this visit.  Subjective Assessment - 05/07/17 1018    Subjective  Pt reports things are going well. She only has pain when the weather is bad. She has a little achiness in her back currently. She says she tried putting the pillow between her knees, but it wouldn't stay there.     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  --    Patient Stated Goals  --    Currently in Pain?  Yes    Pain Score  5     Pain Location  Back    Pain Orientation  Left;Right    Pain Descriptors / Indicators  Aching;Dull    Pain Type  Chronic pain    Pain Radiating Towards  none     Pain Onset  More than a month ago    Pain Frequency  Intermittent    Aggravating Factors   weather dependent     Pain Relieving Factors  exercise helps     Effect of Pain on Daily Activities  mild                        [facility] Adult PT Treatment/Exercise - 05/07/17 0001      Lumbar Exercises: Aerobic   Stationary Bike  intervals L1 to L5 every 60 sec, PT present to discuss session and intervals       Lumbar Exercises: Machines for Strengthening   Leg Press  seat 9, 140# BLE 2x15 reps (cuing for abdominal bracing)       Lumbar Exercises: Standing   Other Standing Lumbar Exercises  hip extension isometric against wall 10x3 sec hold each (1 UE support)     Other Standing Lumbar Exercises  NBOS on foam pad with blue weighted ball taps Lt/Rt hip x10 reps; opposite hip/shoulder x10 reps each       Lumbar Exercises: Seated   Other Seated Lumbar Exercises  abdominal bracing with single leg lift and BUE press overhead 2x10 reps (2# dumbbells)     Other Seated Lumbar Exercises  seated hip flexion x15 reps each, abdominal bracing; seated trunk rotation reach across  body x10 reps each (hip adduction encouraged)              PT Education - 05/07/17 1041    Education provided  Yes    Education Details  technique with therex;     Person(s) Educated  Patient    Methods  Explanation;Verbal cues    Comprehension  Returned demonstration;Verbalized understanding       PT Short Term Goals - 04/21/17 1047      PT SHORT TERM GOAL #1   Title  Therapist will complete FOTO assessment and set appropriate long term goals.     Time  1    Period  Weeks    Status  Achieved      PT SHORT TERM GOAL #2   Title  Pt will demo consistency and independence with her HEP to improve flexibility, strength and decrease pain.    Time  4    Period  Weeks    Status  Achieved      PT SHORT TERM GOAL #3   Title  Pt will report atleast 25% decrease in her pain from the start of therapy, to increase her activity tolerance at home.     Baseline  50% improved     Time  4    Period  Weeks    Status  Achieved        PT Long Term Goals - 04/30/17 1059      PT LONG TERM GOAL #1   Title  Pt will  demo improve LE strength to atleast 4/5 MMT which will increase her safety and efficiency with daily activity.    Baseline  all except Rt hip extension 3/5    Time  8    Period  Weeks    Status  Partially Met      PT LONG TERM GOAL #2   Title  Pt will demo improved hip flexibility, evident by her ability to reach hip extension to neutral during thomas testing, to improve her standing posture and alignment.     Time  8    Period  Weeks    Status  On-going      PT LONG TERM GOAL #3   Title  Pt will demo improved functional stability, evident by her ability to ascend and descend 6" steps with no more than 1 handrail and with reciprocal pattern, x3 trials.     Time  8    Period  Weeks    Status  Achieved      PT LONG TERM GOAL #4   Title  Pt will report atleast 50% reduction in her symptoms from the start of therapy, to allow her to stand and fix a meal with less difficulty.    Baseline  50%    Time  8    Period  Weeks    Status  Achieved      PT LONG TERM GOAL #5   Title  Pt will demo understanding of her advanced HEP program to prepare for discharge and allow her to maintain progress independently.     Time  8    Period  Weeks    Status  New      PT LONG TERM GOAL #6   Title  Pt will score no greater than 43% limitation on FOTO to reflect significant improvements in her function by discharge from PT.    Time  8    Period  Weeks    Status  New  Plan - 05/07/17 1102    Clinical Impression Statement  Pt continues to have dull central low back pain with change in weather, noting pain upon arrival. This was the second session she was able to come without her cane which she also reports using less of at home. Pt was able to complete therex progressions and increases in leg press for additional hip strengthening this session. She did require intermittent cuing to focus on proper breathing, due to her tendency to hold her breath. Ended session with report of decrease in  her low back pain. Will continue with progressions of therex to improve pt's endurance and strength with daily activity.     Rehab Potential  Good    PT Frequency  2x / week    PT Duration  8 weeks    PT Treatment/Interventions  ADLs/Self Care Home Management;Cryotherapy;Electrical Stimulation;Moist Heat;Iontophoresis 48m/ml Dexamethasone;Traction;Gait tScientist, forensicTherapeutic activities;Therapeutic exercise;Balance training;Patient/family education;Neuromuscular re-education;Manual techniques;Taping;Dry needling;Passive range of motion    PT Next Visit Plan  continue with soft tissue techniques to address lateral aspect of the LLE (hip down to knee) as needed; seated and standing lumbar strength/hip strength; lumbar ROM exercises,; hip extensor strength    PT Home Exercise Plan  supine hip flex stretch, seated rows (green band), standing hip ext with yellow band     Consulted and Agree with Plan of Care  Patient       Patient will benefit from skilled therapeutic intervention in order to improve the following deficits and impairments:  Decreased activity tolerance, Difficulty walking, Impaired flexibility, Obesity, Hypomobility, Decreased strength, Decreased range of motion, Postural dysfunction, Increased muscle spasms, Decreased mobility, Improper body mechanics, Pain  Visit Diagnosis: Chronic midline low back pain, with sciatica presence unspecified  Pain in left hip  Muscle weakness (generalized)  Other muscle spasm     Problem List Patient Active Problem List   Diagnosis Date Noted  . Lymphoma in remission ([facility]     11:04 AM,05/07/17 SSherol DadePT, DPT [place]at [facility] [address] [place] [address]Phone: [phone]  Fax:  [phone] Name: LFredrika CanbyMRN: [id number] of Birth: 4May 16, 1962

## 2017-05-12 ENCOUNTER — Ambulatory Visit: Payer: Medicare HMO | Admitting: Physical Therapy

## 2017-05-12 DIAGNOSIS — M6281 Muscle weakness (generalized): Secondary | ICD-10-CM

## 2017-05-12 DIAGNOSIS — M545 Low back pain: Principal | ICD-10-CM

## 2017-05-12 DIAGNOSIS — M62838 Other muscle spasm: Secondary | ICD-10-CM

## 2017-05-12 DIAGNOSIS — M25552 Pain in left hip: Secondary | ICD-10-CM

## 2017-05-12 DIAGNOSIS — G8929 Other chronic pain: Secondary | ICD-10-CM

## 2017-05-12 NOTE — Therapy (Signed)
[facility] [address], [place], [place], [address] Phone: [phone]   Fax:  [phone]  Physical Therapy Treatment  Patient Details  Name: Tracey Stevens MRN: [id number] Date of Birth: [birth year] Referring Provider: Bernerd Limbo, MD    Encounter Date: 05/12/2017  PT End of Session - 05/12/17 1314    Visit Number  14    Number of Visits  16    Date for PT Re-Evaluation  05/21/17    Authorization Type  [employer]     Authorization Time Period  03/26/17 to 05/21/17    PT Start Time  1230    PT Stop Time  1310    PT Time Calculation (min)  40 min    Activity Tolerance  No increased pain;Patient tolerated treatment well    Behavior During Therapy  [facility] for tasks assessed/performed       Past Medical History:  Diagnosis Date  . Cancer (HCC)    cervical  . Hypertension   . 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 ABDOMINAL HYSTERECTOMY    . BREAST BIOPSY Right    benign    There were no vitals filed for this visit.  Subjective Assessment - 05/12/17 1234    Subjective  Pt reports that things are going well. She started noticing yesterday that her equilibrium has seemed "off". She will get dizzy when she gets up from the bed or goes to lay down. She will have to sit there for a while until it settles. She is not sure how long it takes for it to settle.     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  No/denies    Pain Onset  More than a month ago                      [facility] Adult PT Treatment/Exercise - 05/12/17 0001      Lumbar Exercises: Aerobic   Stationary Bike  L2 x5 min, PT present to discuss session and changes in status       Lumbar Exercises: Supine   Bridge  20 reps    Bridge Limitations  x1 set    Other Supine Lumbar Exercises  Alt bent knee lower from  90/90 position 3x5 reps, cues to encourage maintained abdominal activation      Lumbar Exercises: Sidelying   Hip Abduction  10 reps;Both;Other (comment) x2 sets     Hip Abduction Weights (lbs)  3    Hip Abduction Limitations  therapist cuing to decreased hip flexion compensation       Vestibular Treatment/Exercise - 05/12/17 0001      Vestibular Treatment/Exercise   Vestibular Treatment Provided  Canalith Repositioning    Canalith Repositioning  Epley Manuever Right            PT Education - 05/12/17 1313    Education provided  Yes    Education Details  encouraged pt to reach out to PCP if no improvement in dizziness/symptoms; avoiding aggravating positions and sleep adjustments to improve position of the head    Person(s) Educated  Patient    Methods  Explanation    Comprehension  Verbalized understanding       PT Short Term Goals - 04/21/17 1047      PT SHORT TERM GOAL #1   Title  Therapist will complete FOTO assessment and set appropriate long term goals.     Time  1    Period  Weeks    Status  Achieved      PT SHORT TERM GOAL #2   Title  Pt will demo consistency and independence with her HEP to improve flexibility, strength and decrease pain.    Time  4    Period  Weeks    Status  Achieved      PT SHORT TERM GOAL #3   Title  Pt will report atleast 25% decrease in her pain from the start of therapy, to increase her activity tolerance at home.     Baseline  50% improved     Time  4    Period  Weeks    Status  Achieved        PT Long Term Goals - 04/30/17 1059      PT LONG TERM GOAL #1   Title  Pt will demo improve LE strength to atleast 4/5 MMT which will increase her safety and efficiency with daily activity.    Baseline  all except Rt hip extension 3/5    Time  8    Period  Weeks    Status  Partially Met      PT LONG TERM GOAL #2   Title  Pt will demo improved hip flexibility, evident by her ability to reach hip extension to neutral during thomas  testing, to improve her standing posture and alignment.     Time  8    Period  Weeks    Status  On-going      PT LONG TERM GOAL #3   Title  Pt will demo improved functional stability, evident by her ability to ascend and descend 6" steps with no more than 1 handrail and with reciprocal pattern, x3 trials.     Time  8    Period  Weeks    Status  Achieved      PT LONG TERM GOAL #4   Title  Pt will report atleast 50% reduction in her symptoms from the start of therapy, to allow her to stand and fix a meal with less difficulty.    Baseline  50%    Time  8    Period  Weeks    Status  Achieved      PT LONG TERM GOAL #5   Title  Pt will demo understanding of her advanced HEP program to prepare for discharge and allow her to maintain progress independently.     Time  8    Period  Weeks    Status  New      PT LONG TERM GOAL #6   Title  Pt will score no greater than 43% limitation on FOTO to reflect significant improvements in her function by discharge from PT.    Time  8    Period  Weeks    Status  New            Plan - 05/12/17 1315    Clinical Impression Statement  Pt arrived today with acute onset of dizziness with sudden changes in position. She demonstrates increase in dizziness with several of today's exercises, particularly with rolling to the Lt. This greatly limited pt participation during the session, so therapist limited activity within pt's tolerance. Noted positive Epley maneuver with testing, negative for neurological symptoms as well. Pt reported no increase in dizziness end of session. She will likely need  to follow up with PCP regarding this, if no improvement after today's session, for further vestibular evaluation.     Rehab Potential  Good    PT Frequency  2x / week    PT Duration  8 weeks    PT Treatment/Interventions  ADLs/Self Care Home Management;Cryotherapy;Electrical Stimulation;Moist Heat;Iontophoresis 87m/ml Dexamethasone;Traction;Gait tArt gallery managerTherapeutic activities;Therapeutic exercise;Balance training;Patient/family education;Neuromuscular re-education;Manual techniques;Taping;Dry needling;Passive range of motion    PT Next Visit Plan  f/u on dizziness; continue with soft tissue techniques to address lateral aspect of the LLE (hip down to knee) as needed; seated and standing lumbar strength/hip strength; lumbar ROM exercises,; hip extensor strength    PT Home Exercise Plan  supine hip flex stretch, seated rows (green band), standing hip ext with yellow band     Consulted and Agree with Plan of Care  Patient       Patient will benefit from skilled therapeutic intervention in order to improve the following deficits and impairments:  Decreased activity tolerance, Difficulty walking, Impaired flexibility, Obesity, Hypomobility, Decreased strength, Decreased range of motion, Postural dysfunction, Increased muscle spasms, Decreased mobility, Improper body mechanics, Pain  Visit Diagnosis: Chronic midline low back pain, with sciatica presence unspecified  Pain in left hip  Muscle weakness (generalized)  Other muscle spasm     Problem List Patient Active Problem List   Diagnosis Date Noted  . [place] in [place] ([place]     1:24 PM,05/12/17 SSherol DadePT, DPT [place] [facility] [address] [place] [address]: [phone]  Fax:  [phone] Name: LSonnia StrongMRN: [id number] of Birth: 4Apr 07, 1962

## 2017-05-14 ENCOUNTER — Encounter: Payer: Medicare HMO | Admitting: Physical Therapy

## 2017-05-19 ENCOUNTER — Encounter: Payer: Self-pay | Admitting: Physical Therapy

## 2017-05-19 ENCOUNTER — Ambulatory Visit: Payer: Medicare HMO | Attending: Family Medicine | Admitting: Physical Therapy

## 2017-05-19 DIAGNOSIS — M6281 Muscle weakness (generalized): Secondary | ICD-10-CM | POA: Diagnosis present

## 2017-05-19 DIAGNOSIS — M62838 Other muscle spasm: Secondary | ICD-10-CM | POA: Diagnosis present

## 2017-05-19 DIAGNOSIS — M25552 Pain in left hip: Secondary | ICD-10-CM | POA: Diagnosis present

## 2017-05-19 DIAGNOSIS — M545 Low back pain: Secondary | ICD-10-CM | POA: Diagnosis present

## 2017-05-19 DIAGNOSIS — G8929 Other chronic pain: Secondary | ICD-10-CM | POA: Insufficient documentation

## 2017-05-19 NOTE — Therapy (Signed)
[facility] [address] [place], [place], [address] Phone: [phone]   Fax:  [phone]  Physical Therapy Treatment  Patient Details  Name: Tracey Stevens MRN: [id number] Date of Birth: 11-10-1960 Referring Provider: Bernerd Limbo, MD    Encounter Date: 05/19/2017  PT End of Session - 05/19/17 1034    Visit Number  15    Number of Visits  16    Date for PT Re-Evaluation  05/21/17    Authorization Type  [employer]     Authorization Time Period  03/26/17 to 05/21/17    PT Start Time  1015    PT Stop Time  1055    PT Time Calculation (min)  40 min    Activity Tolerance  No increased pain;Patient tolerated treatment well    Behavior During Therapy  [facility] for tasks assessed/performed       Past Medical History:  Diagnosis Date  . Cancer (HCC)    cervical  . Hypertension   . 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 ABDOMINAL HYSTERECTOMY    . BREAST BIOPSY Right    benign    There were no vitals filed for this visit.  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 PT present to discuss progress      Lumbar Exercises: Standing   Row  15 reps;Both;Other (comment) NBOS on firm surface       Lumbar Exercises: Seated   Long Arc Quad on [place]  Both;1 set;15 reps;Limitations    LAQ on [place] Limitations  sitting on dyna disc    Hip Flexion on Ball  15 reps;Both;Limitations    Hip Flexion on Ball Limitations  sitting on dyna disc     Sit to Stand  10 reps standing on foam pad    Other Seated Lumbar Exercises  abdominal brace sitting on dyna disc: overhead press with 4# dumbbells and single leg support x10 reps each LE.    Other Seated Lumbar Exercises  anterior/posterior pelvic tilts x15 reps, heavy cuing required from therapist for proper technique             PT Education - 05/19/17 1045    Education provided  Yes    Education Details  discussed benefits of vestibular referral if MRI comes back negative and positional vertigo continues to be an issue; upcoming re-evaluation    Person(s) Educated  Patient    Methods  Explanation    Comprehension  Verbalized understanding       PT Short Term Goals - 04/21/17 1047      PT SHORT TERM GOAL #1   Title  Therapist will complete FOTO assessment and set appropriate long term goals.     Time  1    Period  Weeks    Status  Achieved  PT SHORT TERM GOAL #2   Title  Pt will demo consistency and independence with her HEP to improve flexibility, strength and decrease pain.    Time  4    Period  Weeks    Status  Achieved      PT SHORT TERM GOAL #3   Title  Pt will report atleast 25% decrease in her pain from the start of therapy, to increase her activity tolerance at home.     Baseline  50% improved     Time  4    Period  Weeks    Status  Achieved        PT Long Term Goals - 04/30/17 1059      PT LONG TERM GOAL #1   Title  Pt will demo improve LE strength to atleast 4/5 MMT which will increase her safety and efficiency with daily activity.    Baseline  all except Rt hip extension 3/5    Time  8    Period  Weeks    Status  Partially Met      PT LONG TERM GOAL #2   Title  Pt will demo improved hip flexibility, evident by her ability to reach hip extension to neutral during thomas testing, to improve her standing posture and alignment.     Time  8    Period  Weeks    Status  On-going      PT LONG TERM GOAL #3   Title  Pt will demo improved  functional stability, evident by her ability to ascend and descend 6" steps with no more than 1 handrail and with reciprocal pattern, x3 trials.     Time  8    Period  Weeks    Status  Achieved      PT LONG TERM GOAL #4   Title  Pt will report atleast 50% reduction in her symptoms from the start of therapy, to allow her to stand and fix a meal with less difficulty.    Baseline  50%    Time  8    Period  Weeks    Status  Achieved      PT LONG TERM GOAL #5   Title  Pt will demo understanding of her advanced HEP program to prepare for discharge and allow her to maintain progress independently.     Time  8    Period  Weeks    Status  New      PT LONG TERM GOAL #6   Title  Pt will score no greater than 43% limitation on FOTO to reflect significant improvements in her function by discharge from PT.    Time  8    Period  Weeks    Status  New            Plan - 05/19/17 1057    Clinical Impression Statement  Pt reports slight improvement in her vertigo, and therapist limited supine activities to decrease irritation. Completed seated therex to improve abdominal strength and stability, and pt did have increased difficulty completing these on unstable surfaces. Overall, pt is steadily able to complete progressions in exercise with minimal increase in low back pain. Will complete re-evaluation next session to determine need for skilled PT.    Rehab Potential  Good    PT Frequency  2x / week    PT Duration  8 weeks    PT Treatment/Interventions  ADLs/Self Care Home Management;Cryotherapy;Electrical Stimulation;Moist Heat;Iontophoresis 65m/ml Dexamethasone;Traction;Gait tScientist, forensicTherapeutic activities;Therapeutic  exercise;Balance training;Patient/family education;Neuromuscular re-education;Manual techniques;Taping;Dry needling;Passive range of motion    PT Next Visit Plan  re-evaluation, possible d/c?     PT Home Exercise Plan  supine hip flex stretch, seated rows (green band),  standing hip ext with yellow band     Consulted and Agree with Plan of Care  Patient       Patient will benefit from skilled therapeutic intervention in order to improve the following deficits and impairments:  Decreased activity tolerance, Difficulty walking, Impaired flexibility, Obesity, Hypomobility, Decreased strength, Decreased range of motion, Postural dysfunction, Increased muscle spasms, Decreased mobility, Improper body mechanics, Pain  Visit Diagnosis: Chronic midline low back pain, with sciatica presence unspecified  Pain in left hip  Muscle weakness (generalized)  Other muscle spasm     Problem List Patient Active Problem List   Diagnosis Date Noted  . Lymphoma in remission ([place])     11:01 AM,05/19/17 Sherol Dade PT, DPT [place] [facility] [place]  [facility] [address], [place], [place], [address] Phone: [phone]   Fax:  [phone]  Name: Arelly Whittenberg MRN: [id number] Date of Birth: 1960/10/31

## 2017-05-21 ENCOUNTER — Encounter: Payer: Self-pay | Admitting: Physical Therapy

## 2017-05-21 ENCOUNTER — Ambulatory Visit: Payer: Medicare HMO | Admitting: Physical Therapy

## 2017-05-21 DIAGNOSIS — M62838 Other muscle spasm: Secondary | ICD-10-CM

## 2017-05-21 DIAGNOSIS — M6281 Muscle weakness (generalized): Secondary | ICD-10-CM

## 2017-05-21 DIAGNOSIS — M545 Low back pain: Principal | ICD-10-CM

## 2017-05-21 DIAGNOSIS — M25552 Pain in left hip: Secondary | ICD-10-CM

## 2017-05-21 DIAGNOSIS — G8929 Other chronic pain: Secondary | ICD-10-CM

## 2017-05-21 NOTE — Patient Instructions (Signed)
    LOOPED ELASTIC BAND HIP EXTENSION   While standing with an elastic band looped around your ankles, move the target leg back as shown.    Keep your knees straight the entire time. Using red band.   x10 reps on each leg.        Rows with Theraband   Standing with feet hip width apart, stomach drawn in and glute muscles tight. Start with arms extended in front of you. Pull shoulder blades down and back and then pull elbows in towards body. Slowly return to starting position.      Using green band, complete 15 reps. 2 sets        Supine Hip Flexor Stretch   Lie on the back with the affected leg hanging off the table. Then pull the unaffected leg towards your body for a greater stretch (you can use a towel to support the leg) and to support the lower back.    If the foot of the hanging affected leg touches the ground, bend the knee and plant/scoot the foot back until you feel a stretch on the top of the thigh.     Hold 30 sec, repeat 2x, then switch sides.        *Continue to complete stretches in bed as needed                     Side Steps: Band around Feet   Place a band around your feet.   With your knees slightly bent and toes pointed forward, step sideways to the end of the countertop and return to the starting position facing the same direction.   **IMPORTANT** Keep tension in the band the entire time by taking a big step with leading foot, then, a smaller step with the trailing foot.   Remember to shift your weight back on the heels of your feet but do not let your toes come off of the ground.    x2-3 times down and back   [facility] [address], [place], [place] [address] Phone # [phone] Fax [phone]

## 2017-05-21 NOTE — Therapy (Signed)
[facility] [address] [place], [place], [address] Phone: [phone]   Fax:  [phone]  Physical Therapy Treatment/Discharge  Patient Details  Name: Tracey Stevens MRN: [id number] Date of Birth: October 02, 1960 Referring Provider: Bernerd Limbo, MD   Encounter Date: 05/21/2017  PT End of Session - 05/21/17 1041    Visit Number  16    Number of Visits  16    Date for PT Re-Evaluation  05/21/17    Authorization Type  [employer]     Authorization Time Period  03/26/17 to 05/21/17    PT Start Time  1016    PT Stop Time  1050    PT Time Calculation (min)  34 min    Activity Tolerance  No increased pain;Patient tolerated treatment well    Behavior During Therapy  [facility] for tasks assessed/performed       Past Medical History:  Diagnosis Date  . Cancer (HCC)    cervical  . Hypertension   . 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 ABDOMINAL HYSTERECTOMY    . BREAST BIOPSY Right    benign    There were no vitals filed for this visit.  Subjective Assessment - 05/21/17 1024    Subjective  Pt reports that she is having issues with getting her new apartment. She says her back is doing well, just a little achy with the weather. Her Lt hip does not bother her anymore at all and she is trying to use the stairs reciprocally without any issues. She feels atleast 60% improved overall. She has been doing alot of stretches lately to help with the pain she will have in her back sometimes.     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  No/denies    Pain Onset  More than a month ago         [facility] PT Assessment - 05/21/17 0001      Assessment   Medical Diagnosis  Lt leg pain    Referring Provider  Bernerd Limbo, MD    Onset Date/Surgical Date  -- Thanksgiving 2018    Next MD Visit  unsure for this     Prior Therapy  none       Precautions   Precautions  None      Balance Screen   Has the patient fallen in the past 6 months  No    Has the patient had a decrease in activity level because of a fear of falling?   No    Is the patient reluctant to leave their home because of a fear of falling?   No      Home Environment   Additional Comments  flight of stairs to get into house      Prior Function   Level of Independence  Independent      Cognition   Overall Cognitive Status  Within Functional Limits for tasks assessed      Observation/Other Assessments   Focus on Therapeutic Outcomes (FOTO)   1% limited Lt hip      Sensation   Additional Comments  Pt denies any numbness/tingling      AROM   Lumbar Flexion  pulling low back    Lumbar Extension  pain free      Strength   Right Hip Flexion  5/5  Right Hip Extension  3+/5 ROM limited     Right Hip ABduction  5/5    Left Hip Flexion  5/5    Left Hip Extension  5/5 ROM limited    Left Hip ABduction  5/5    Right Knee Extension  5/5    Left Knee Extension  5/5    Right Ankle Dorsiflexion  5/5    Left Ankle Dorsiflexion  5/5      Flexibility   Soft Tissue Assessment /Muscle Length  yes    Hamstrings  35 deg lacking Lt/Rt in 90/90 testing    Quadriceps  95 deg Lt/Rt in prone      Palpation   Palpation comment  --      Special Tests    Special Tests  --    Lumbar Tests  --    Hip Special Tests   Marcello Moores Test;Ober's Test      Slump test   Findings  --    Comment  --      Prone Knee Bend Test   Findings  --      Straight Leg Raise   Findings  Negative      Thomas Test    Findings  Positive    Side  Right;Left      Ober's Test   Findings  --    Side  --    Comments  --      Transfers   Five time sit to stand comments   14 sec, no UE support     Comments  --           PT Education - 05/21/17 1055    Education provided  Yes    Education Details  discussed progress/goals met; discharge from PT and  goals moving foward; discussed progressions of resistance to address remaining limitations     Person(s) Educated  Patient    Methods  Explanation;Handout    Comprehension  Verbalized understanding       PT Short Term Goals - 04/21/17 1047      PT SHORT TERM GOAL #1   Title  Therapist will complete FOTO assessment and set appropriate long term goals.     Time  1    Period  Weeks    Status  Achieved      PT SHORT TERM GOAL #2   Title  Pt will demo consistency and independence with her HEP to improve flexibility, strength and decrease pain.    Time  4    Period  Weeks    Status  Achieved      PT SHORT TERM GOAL #3   Title  Pt will report atleast 25% decrease in her pain from the start of therapy, to increase her activity tolerance at home.     Baseline  50% improved     Time  4    Period  Weeks    Status  Achieved        PT Long Term Goals - 05/21/17 1038      PT LONG TERM GOAL #1   Title  Pt will demo improve LE strength to atleast 4/5 MMT which will increase her safety and efficiency with daily activity.    Baseline  all met, except Rt hip extension 3+/5 MMT    Time  8    Period  Weeks    Status  Partially Met      PT LONG TERM GOAL #2   Title  Pt will demo improved hip flexibility, evident by her ability to reach hip extension to neutral during thomas testing, to improve her standing posture and alignment.     Time  8    Period  Weeks    Status  Partially Met      PT LONG TERM GOAL #3   Title  Pt will demo improved functional stability, evident by her ability to ascend and descend 6" steps with no more than 1 handrail and with reciprocal pattern, x3 trials.     Time  8    Period  Weeks    Status  Achieved      PT LONG TERM GOAL #4   Title  Pt will report atleast 50% reduction in her symptoms from the start of therapy, to allow her to stand and fix a meal with less difficulty.    Baseline  60% improvement    Time  8    Period  Weeks    Status  Achieved       PT LONG TERM GOAL #5   Title  Pt will demo understanding of her advanced HEP program to prepare for discharge and allow her to maintain progress independently.     Time  8    Period  Weeks    Status  Achieved      PT LONG TERM GOAL #6   Title  Pt will score no greater than 43% limitation on FOTO to reflect significant improvements in her function by discharge from PT.    Baseline  1% limitation    Time  8    Period  Weeks    Status  Achieved            Plan - 05/21/17 1046    Clinical Impression Statement  Pt was discharged this visit having reported full resolution of Lt hip/LE pain. She notes overall 60% improvement in low back pain and has been demonstrating consistent HEP adherence over the past several weeks. She will have occasional low back pain with changes in weather, however she reports good understanding of pain management techniques and is able to complete her daily activities. She demonstrates improvements in hip strength, 5/5 MMT except for hip extension on the Rt which is improved. Pt demonstrates good functional LE strength and is able to easily get on/off the mat tables, indicating improved trunk strength as well. She is pleased with her progress and agreeable with d/c to allow her to continue working on her HEP moving forward.     Rehab Potential  Good    PT Frequency  2x / week    PT Duration  8 weeks    PT Treatment/Interventions  ADLs/Self Care Home Management;Cryotherapy;Electrical Stimulation;Moist Heat;Iontophoresis 54m/ml Dexamethasone;Traction;Gait tScientist, forensicTherapeutic activities;Therapeutic exercise;Balance training;Patient/family education;Neuromuscular re-education;Manual techniques;Taping;Dry needling;Passive range of motion    PT Next Visit Plan  d/c home with HEP    PT Home Exercise Plan  see pt instructions    Consulted and Agree with Plan of Care  Patient       Patient will benefit from skilled therapeutic intervention in order to  improve the following deficits and impairments:  Decreased activity tolerance, Difficulty walking, Impaired flexibility, Obesity, Hypomobility, Decreased strength, Decreased range of motion, Postural dysfunction, Increased muscle spasms, Decreased mobility, Improper body mechanics, Pain  Visit Diagnosis: Chronic midline low back pain, with sciatica presence unspecified  Pain in left hip  Muscle weakness (generalized)  Other muscle spasm  PHYSICAL THERAPY DISCHARGE SUMMARY  Visits from  Start of Care: 16  Current functional level related to goals / functional outcomes: See above for more details    Remaining deficits: See above for more details    Education / Equipment: See above for more details   Plan: Patient agrees to discharge.  Patient goals were met. Patient is being discharged due to being pleased with the current functional level.  ?????        Problem List Patient Active Problem List   Diagnosis Date Noted  . Lymphoma in remission [facility])     10:57 AM,05/21/17 Sherol Dade PT, DPT [place] [facility] [place]  [facility] [address] [place], [place], [address] Phone: [phone]   Fax:  [phone]  Name: Tracey Stevens MRN: [id number] Date of Birth: 26-Aug-1960

## 2017-05-25 ENCOUNTER — Encounter: Payer: Self-pay | Admitting: Neurology

## 2017-07-07 NOTE — Progress Notes (Signed)
Tracey Stevens Kitchen    HEMATOLOGY/ONCOLOGY CLINIC NOTE  Date of Service: 07/13/2017  Patient Care Team: Tracey Lyme, FNP as PCP - General (Nurse Practitioner)  CHIEF COMPLAINTS/PURPOSE OF CONSULTATION:  f/u for Diffuse large B-cell lymphoma.   HISTORY OF PRESENTING ILLNESS:   Tracey Stevens is a wonderful 57 y.o. female who has been referred to [place] by Dr .Tracey Stevens, Tracey Humbles, FNP  for evaluation and continued management of Diffuse large B-cell lymphoma.  Patient has a history of cervical cancer diagnosed in 2003 in [place] treated with total abdominal hysterectomy. Has been in remission since. He did not require any chemoradiation. Bilateral ovaries intact per patient.  She presented to Dr. Alferd Patee MD at [facility] in October 2014 with left-sided abdominal and chest pain and was found to have abnormal liver function tests. Subsequent CT chest abdomen pelvis showed large hepatic lesions. Biopsy of one of the liver lesions in the right lobe of the liver on 12/05/2012 showed diffuse large B-cell lymphoma with a Ki-67 of more than 95%. Bone marrow biopsy was done on 12/29/2012 and was noted to be negative for lymphoma. Patient subsequently had a PET/CT scan which showed lymphoma involving the liver, spleen, mesenteric lymph nodes as well as a T12 fracture for which she was given a back brace.  Patient has been treated with 6 cycles of R CHOP for suspected double hit DL BCL and received 4 intrathecal doses of methotrexate for CNS prophylaxis. PET/CT scan done on 08/31/2013 showed no evidence of residual lymphoma. She was noted to have bilateral nodular densities in bilateral lower lungs which were thought to be due to inflammation/infection and she was empirically treated with levofloxacin for one week.  She has had issues with back pain intermittently and significant renal stones. She notes that she had her parathyroids removed for hyperparathyroidism.  She subsequently moved from  [facility] to [place] and was seen on 08/05/2015 by Dr. Marcine Stevens at the [facility] in [place] and had labs including a normal LDH of 135, normal CBC and normal chemistries  on 08/05/2015. She subsequently had a CT chest abdomen pelvis on 08/16/2015 that showed no evidence of abnormal lymphadenopathy within the chest abdomen or pelvis.  Noted to have an indeterminate 1.5 cm left adrenal gland nodule. Severe remote compression deformity of T12. She was noted to have left sided nephrolithiasis and simple appearing bilateral renal cysts for which she has been validated by Dr. Festus Stevens at [facility] urology.  Patient notes no fevers no chills no drenching night sweats. No weight loss. No other specific new focal symptoms at this time.  Notes that she has had issues with chronic back pains for which she has been using a brace. She has been validated by neurosurgery Dr. Sherwood Stevens and a decision has been made against surgery at this time. Has been using naproxen for her back pain.  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  INTERVAL HISTORY   Tracey Stevens is here for her scheduled 6 month follow-up for diffuse large B-cell lymphoma. Since her last visit she presented to the ED twice for [place] and back pain.  Today she presents to the clinic noting she is doing well. She notes Dr. Jamey Stevens took her off naproxen and put her on Meloxicam. She is under pain management.  She was on amlodipine  and HCTZ but was switched to Lisinopril by Dr. Coletta Stevens  On 07/01/17. She will see him back next month. She recently saw Urologist and had a trace blood in sample. She has 1 kidney stone seen by xray and may get a cystoscopy. She notes her recent cholesterol level was elevated and she restarted Lipitor.   On review of symptoms, pt denies fever, chills, new lumps or bumps. She denies  trouble passing urine or abdominal pain. She notes she has felt some spinning when laying down at home.     MEDICAL HISTORY:  Past Medical History:  Diagnosis Date  . Cancer (HCC)    cervical  . Hypertension   . 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  Vertigo-likely due to either etiology Adrenal nodule Renal cysts Nephrolithiasis Hypertension T12 compression fracture Multinodular goiter Primary hyperparathyroidism status post parathyroidectomy Vitamin D deficiency Left-sided conductive hearing loss   SURGICAL HISTORY: Past Surgical History:  Procedure Laterality Date  . ABDOMINAL HYSTERECTOMY    . BREAST BIOPSY Right    benign  Biopsy of liver lesion noted to be diffuse large B-cell lymphoma in 2014. Parathyroidectomy-subtotal History of left ear surgery History of chemotherapy  SOCIAL HISTORY: Social History   Socioeconomic History  . Marital status: Legally Separated    Spouse name: Not on file  . Number of children: Not on file  . Years of education: Not on file  . Highest education level: Not on file  Occupational History  . Not on file  Social Needs  . Financial resource strain: Not on file  . Food insecurity:    Worry: Not on file    Inability: Not on file  . Transportation needs:    Medical: Not on file    Non-medical: Not on file  Tobacco Use  . Smoking status: Never Smoker  . Smokeless tobacco: Never Used  Substance and Sexual Activity  . Alcohol use: No  . Drug use: No  . Sexual activity: Not on file  Lifestyle  . Physical activity:    Days per week: Not on file    Minutes per session: Not on file  . Stress: Not on file  Relationships  . Social connections:    Talks on phone: Not on file    Gets together: Not on file    Attends religious service: Not on  file    Active member of club or organization: Not on file    Attends meetings of clubs or organizations: Not on file    Relationship status: Not on file  . Intimate partner violence:    Fear of current or ex partner: Not on file    Emotionally abused: Not on file    Physically abused: Not on file    Forced sexual activity: Not on file  Other Topics Concern  . Not on file  Social History Narrative  . Not on file  Moved from [place] to [facility] about one year ago  FAMILY HISTORY:  Family history of coronary artery disease -mother  Hypertension-mother -Gastric cancer-father Renal failure father   ALLERGIES:  has No Known Allergies.  MEDICATIONS:  Current Outpatient Medications  Medication Sig Dispense Refill  . alendronate (FOSAMAX) 70 MG tablet Take 70 mg by mouth once a week. Take with a full glass of water on  an empty stomach.    Tracey Stevens Kitchen amLODipine (NORVASC) 10 MG tablet Take 10 mg by mouth daily.     Tracey Stevens Kitchen atorvastatin (LIPITOR) 10 MG tablet Take 10 mg by mouth daily.    Tracey Stevens Kitchen azelastine (ASTELIN) 0.1 % nasal spray Place 1 spray into both nostrils 2 (two) times daily. Use in each nostril as directed    . cholecalciferol (VITAMIN D) 1000 units tablet Take 1,000 Units by mouth daily.     . cyclobenzaprine (FLEXERIL) 5 MG tablet Take 1 tablet (5 mg total) by mouth 2 (two) times daily. (Patient not taking: Reported on 03/26/2017) 20 tablet 0  . fluticasone (FLONASE) 50 MCG/ACT nasal spray Place 1 spray into both nostrils daily.    . hydrochlorothiazide (HYDRODIURIL) 25 MG tablet Take 25 mg by mouth daily.    Tracey Stevens Kitchen lidocaine (LIDODERM) 5 % Place 1 patch onto the skin daily. Remove & Discard patch within 12 hours or as directed by MD 6 patch 1  . naproxen (NAPROSYN) 500 MG tablet TAKE 1 TABLET BY MOUTH TWICE DAILY     No current facility-administered medications for this visit.     REVIEW OF SYSTEMS:    .10 Point review of Systems was done is negative except as  noted above.   PHYSICAL EXAMINATION: ECOG PERFORMANCE STATUS: 1 - Symptomatic but completely ambulatory  . Vitals:   07/13/17 1021  BP: (!) 158/79  Pulse: 85  Resp: 18  Temp: 98.4 F (36.9 C)  SpO2: 97%   Filed Weights   07/13/17 1021  Weight: (!) 310 lb 1.6 oz (140.7 kg)   .Body mass index is 40.91 kg/m. Tracey Stevens Kitchen GENERAL:alert, in no acute distress and comfortable SKIN: no acute rashes, no significant lesions EYES: conjunctiva are pink and non-injected, sclera anicteric OROPHARYNX: MMM, no exudates, no oropharyngeal erythema or ulceration NECK: supple, no JVD LYMPH:  no palpable lymphadenopathy in the cervical, axillary or inguinal regions LUNGS: clear to auscultation b/l with normal respiratory effort HEART: regular rate & rhythm ABDOMEN:  normoactive bowel sounds , non tender, not distended. Extremity: no pedal edema PSYCH: alert & oriented x 3 with fluent speech NEURO: no focal motor/sensory deficits  LABORATORY DATA:  I have reviewed the data as listed  . CBC Latest Ref Rng & Units 07/13/2017 02/25/2017 01/05/2017  WBC 3.9 - 10.3 K/uL 3.8(L) 5.5 3.5(L)  Hemoglobin 11.6 - 15.9 g/dL 13.2 13.9 13.1  Hematocrit 34.8 - 46.6 % 38.8 39.6 38.9  Platelets 145 - 400 K/uL 279 317 279   . CBC    Component Value Date/Time   WBC 3.8 (L) 07/13/2017 0922   RBC 4.28 07/13/2017 0922   RBC 4.28 07/13/2017 0922   HGB 13.2 07/13/2017 0922   HGB 13.1 01/05/2017 1047   HCT 38.8 07/13/2017 0922   HCT 38.9 01/05/2017 1047   PLT 279 07/13/2017 0922   PLT 279 01/05/2017 1047   MCV 90.7 07/13/2017 0922   MCV 91.1 01/05/2017 1047   MCH 30.8 07/13/2017 0922   MCHC 34.0 07/13/2017 0922   RDW 12.7 07/13/2017 0922   RDW 13.3 01/05/2017 1047   LYMPHSABS 1.7 07/13/2017 0922   LYMPHSABS 1.7 01/05/2017 1047   MONOABS 0.3 07/13/2017 0922   MONOABS 0.4 01/05/2017 1047   EOSABS 0.1 07/13/2017 0922   EOSABS 0.2 01/05/2017 1047   BASOSABS 0.0 07/13/2017 0922   BASOSABS 0.0 01/05/2017 1047      . CMP Latest Ref Rng & Units 07/13/2017 02/25/2017 01/05/2017  Glucose 70 - 140 mg/dL 82 97  93  BUN 7 - 26 mg/dL 18 15 14.3  Creatinine 0.60 - 1.10 mg/dL 1.44(H) 0.89 1.1  Sodium 136 - 145 mmol/L 144 140 144  Potassium 3.5 - 5.1 mmol/L 3.9 3.3(L) 4.1  Chloride 98 - 109 mmol/L 107 105 -  CO2 22 - 29 mmol/L _0 Calcium 8.4 - 10.4 mg/dL 10.4 9.2 10.1  Total Protein 6.4 - 8.3 g/dL 7.1 7.2 7.1  Total Bilirubin 0.2 - 1.2 mg/dL 0.6 0.9 0.62  Alkaline Phos 40 - 150 U/L 49 56 66  AST 5 - 34 U/L 15 14(L) 17  ALT 0 - 55 U/L _1 . Lab Results  Component Value Date   LDH 165 07/13/2017     CT Chest Abdomen Pelvis W Contrast6/04/2015 [facility] Result Impression  IMPRESSION: 1.No evidence of abnormal lymphadenopathy within the chest, abdomen, or pelvis.  2.Indeterminate 1.5 cm left adrenal gland nodule.  3.Severe remote compression deformity of T12 which may reflect remote history of lymphoma.  4.Hepatic cyst  5. Simple appearing bilateral renal cysts. Left-sided nephrolithiasis.  6.Small hiatal hernia.  Result Narrative  INDICATION: C83.30: Diffuse large B-cell lymphoma, unspecified site.  COMPARISON:None available.  TECHNIQUE:CT CHEST ABDOMEN PELVIS W CONTRAST -100 mL Isovue 370 were administered intravenously. Dose reduction was utilized (automated exposure control, mA or kV adjustment based on patient size, or iterative image reconstruction).  FINDINGS:   SUPPORT APPARATUS: N.A.   LUNGS/PLEURA: No focal airspace opacities/consolidations. No pneumothorax.  No abnormal pulmonary masses.  No pleural effusions.  HEART/MEDIASTINUM: Cardiac size is normal. No acute thoracic aortic abnormalities.  No hilar, mediastinal, or axillary lymphadenopathy.  SOLID ABDOMINAL VISCERA: Liver: Small hepatic cysts measures 1.8 cm.. Gallbladder: Contracted Pancreas: Unremarkable. Adrenal glands: Indeterminate left gland nodule measuring  1.3 x 1.5 cm. Normal right adrenal gland. Spleen: Unremarkable. Kidneys: Multiple nonobstructing calculi within the left lower pole renal calyx largest measuring 8 mm. No hydronephrosis or perinephric stranding bilaterally. There are multiple bilateral renal cysts larger of which are seen on the left measuring up to  6.5 cm in size. There are smaller right renal cysts largest 1.2 cm.  GI: No bowel obstruction. Small hiatal hernia. No focal bowel wall thickening or inflammatory changes. Normal appendix.   PERITONEAL CAVITY/RETROPERITONEUM: No free fluid. No pneumoperitoneum. No lymphadenopathy.  PELVIS: No acute abnormalities.  MUSCULOSKELETAL: No acute or destructive osseous processes.  MISC./CHRONIC FINDINGS: Atherosclerotic calcifications are present, particularly involving the visualized aorta. Chronic/degenerative changes at L5-S1. Remote severe compression deformity of T12.   RADIOGRAPHIC STUDIES: I have personally reviewed the radiological images as listed and agreed with the findings in the report. No results found.   MRI Brain 05/20/17 at [place]: #Skull/marrow/soft tissues: No calvarial or skull base lesion. #Orbits: Unremarkable. #Sinuses: Unremarkable. #Brain: Overall parenchymal volume appropriate for age. No significant signal abnormality appreciated within the brain parenchyma. In particular, no mass, hemorrhage or infarction. No significant white matter disease. Mild expanded empty sella. No  midline shift, hydrocephalus or extra-axial collections. Flow is demonstrated within the major vessels at the base of the brain.  MRI Spine Lumbar 03/18/17 at [place]:  1. Old compression fracture at T12 2. Severe left neuroforaminal stenosis and moderate to severe right neuroforaminal stenosis at L5-S1 due to disc bulge and facet arthritis  3. 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 Diagnosed October  2014. Patient was treated with R CHOP 6 cycles and CNS prophylaxis with intrathecal methotrexate 4 doses. She is now about 4 years out and has been in complete remission. She has no constitutional symptoms at this time or palpable lymphadenopathy or hepatosplenomegaly or other focal symptoms to suggest lymphoma recurrence at this time. Her last CT chest abdomen pelvis on 08/16/2015 were negative for evidence of lymphoma.  Plan  -Patient has no clinical symptoms or physical examination findings of lymphoma recurrence at this time. -Her labs are stable with a normal CBC and normal LDH levels, except Cr at 1.44 and decreased GFR.  -She was switched to Meloxicam and lisinopril this month which can effect her kidney function. I encouraged her to be closely followed by ordering physicians, and adjust dosage as needed.  --Unless any concerning findings on labs or patient has new concerning symptoms would hold off on additional scheduled imaging for DLBCL surveillance at this time. -She was previously counseled on concerning symptoms to monitor for an call us with. -Continue surveillance every 6 months with labs   #2 left-sided nephrolithiasis with renal cysts-being followed by Tracey Stevens with the [facility] urology. -kidney stone is stable, her urologist is considering Cystoscopy   #3 chronic back pains due to degenerative disc disease and compression fracture at T12 from [place]. -Follows with neurosurgery Dr. Sherwood Stevens. -Previously discussed possible referral to [place] class to aid with patient back pains -She is now under pain management. Her naproxen was recently changed to Meloxicam in 06/2017.   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  -Seen on 05/20/17 brain MRI  -Continue  to be followed by neurologist.    RTC with Dr Irene Limbo in 6 months with labs  All of the patients questions were answered with apparent satisfaction. The patient knows to call the clinic with any problems, questions or concerns.  . The total time spent in the appointment was 25 minutes and more than 50% was on counseling and direct patient cares.     Sullivan Lone MD MS AAHIVMS [facility] Hematology/Oncology Physician [place]  (Office):       [phone] (Work cell):  [phone] (Fax):           [phone]  07/13/2017 10:47 AM  This document serves as a record of services personally performed by Sullivan Lone, MD. It was created on his behalf by Joslyn Devon, a trained medical scribe. The creation of this record is based on the scribe's personal observations and the provider's statements to them.   .I have reviewed the above documentation for accuracy and completeness, and I agree with the above. Brunetta Genera MD MS

## 2017-07-08 ENCOUNTER — Ambulatory Visit: Payer: Medicare HMO | Admitting: Hematology

## 2017-07-08 ENCOUNTER — Other Ambulatory Visit: Payer: Medicare HMO

## 2017-07-13 ENCOUNTER — Inpatient Hospital Stay (HOSPITAL_BASED_OUTPATIENT_CLINIC_OR_DEPARTMENT_OTHER): Payer: Medicare HMO | Admitting: Hematology

## 2017-07-13 ENCOUNTER — Encounter: Payer: Self-pay | Admitting: Hematology

## 2017-07-13 ENCOUNTER — Inpatient Hospital Stay: Payer: Medicare HMO | Attending: Hematology

## 2017-07-13 ENCOUNTER — Telehealth: Payer: Self-pay

## 2017-07-13 VITALS — BP 158/79 | HR 85 | Temp 98.4°F | Resp 18 | Ht 73.0 in | Wt 310.1 lb

## 2017-07-13 DIAGNOSIS — N200 Calculus of kidney: Secondary | ICD-10-CM | POA: Insufficient documentation

## 2017-07-13 DIAGNOSIS — R42 Dizziness and giddiness: Secondary | ICD-10-CM | POA: Diagnosis not present

## 2017-07-13 DIAGNOSIS — C8339 Diffuse large B-cell lymphoma, extranodal and solid organ sites: Secondary | ICD-10-CM | POA: Diagnosis present

## 2017-07-13 DIAGNOSIS — G8929 Other chronic pain: Secondary | ICD-10-CM | POA: Diagnosis not present

## 2017-07-13 DIAGNOSIS — E213 Hyperparathyroidism, unspecified: Secondary | ICD-10-CM

## 2017-07-13 DIAGNOSIS — C8338 Diffuse large B-cell lymphoma, lymph nodes of multiple sites: Secondary | ICD-10-CM

## 2017-07-13 DIAGNOSIS — Z8541 Personal history of malignant neoplasm of cervix uteri: Secondary | ICD-10-CM | POA: Diagnosis not present

## 2017-07-13 LAB — CBC WITH DIFFERENTIAL/PLATELET
BASOS ABS: 0 10*3/uL (ref 0.0–0.1)
BASOS PCT: 1 %
EOS ABS: 0.1 10*3/uL (ref 0.0–0.5)
Eosinophils Relative: 3 %
HEMATOCRIT: 38.8 % (ref 34.8–46.6)
HEMOGLOBIN: 13.2 g/dL (ref 11.6–15.9)
Lymphocytes Relative: 44 %
Lymphs Abs: 1.7 10*3/uL (ref 0.9–3.3)
MCH: 30.8 pg (ref 25.1–34.0)
MCHC: 34 g/dL (ref 31.5–36.0)
MCV: 90.7 fL (ref 79.5–101.0)
Monocytes Absolute: 0.3 10*3/uL (ref 0.1–0.9)
Monocytes Relative: 7 %
NEUTROS ABS: 1.7 10*3/uL (ref 1.5–6.5)
NEUTROS PCT: 45 %
Platelets: 279 10*3/uL (ref 145–400)
RBC: 4.28 MIL/uL (ref 3.70–5.45)
RDW: 12.7 % (ref 11.2–14.5)
WBC: 3.8 10*3/uL — AB (ref 3.9–10.3)

## 2017-07-13 LAB — COMPREHENSIVE METABOLIC PANEL
ALK PHOS: 49 U/L (ref 40–150)
ALT: 9 U/L (ref 0–55)
AST: 15 U/L (ref 5–34)
Albumin: 3.7 g/dL (ref 3.5–5.0)
Anion gap: 8 (ref 3–11)
BILIRUBIN TOTAL: 0.6 mg/dL (ref 0.2–1.2)
BUN: 18 mg/dL (ref 7–26)
CALCIUM: 10.4 mg/dL (ref 8.4–10.4)
CO2: 29 mmol/L (ref 22–29)
CREATININE: 1.44 mg/dL — AB (ref 0.60–1.10)
Chloride: 107 mmol/L (ref 98–109)
GFR, EST AFRICAN AMERICAN: 45 mL/min — AB (ref >60)
GFR, EST NON AFRICAN AMERICAN: 39 mL/min — AB (ref >60)
Glucose, Bld: 82 mg/dL (ref 70–140)
Potassium: 3.9 mmol/L (ref 3.5–5.1)
SODIUM: 144 mmol/L (ref 136–145)
TOTAL PROTEIN: 7.1 g/dL (ref 6.4–8.3)

## 2017-07-13 LAB — RETICULOCYTES
RBC.: 4.28 MIL/uL (ref 3.70–5.45)
RETIC COUNT ABSOLUTE: 64.2 10*3/uL (ref 33.7–90.7)
Retic Ct Pct: 1.5 % (ref 0.7–2.1)

## 2017-07-13 LAB — LACTATE DEHYDROGENASE: LDH: 165 U/L (ref 125–245)

## 2017-07-13 NOTE — Telephone Encounter (Signed)
Printed avs and calender of upcoming appointment. Per 4/30 los 

## 2017-07-28 ENCOUNTER — Ambulatory Visit (INDEPENDENT_AMBULATORY_CARE_PROVIDER_SITE_OTHER): Payer: Medicare HMO | Admitting: Neurology

## 2017-07-28 ENCOUNTER — Encounter: Payer: Self-pay | Admitting: Neurology

## 2017-07-28 VITALS — BP 108/78 | HR 94 | Ht 73.0 in | Wt 304.0 lb

## 2017-07-28 DIAGNOSIS — G44219 Episodic tension-type headache, not intractable: Secondary | ICD-10-CM

## 2017-07-28 DIAGNOSIS — H538 Other visual disturbances: Secondary | ICD-10-CM | POA: Diagnosis not present

## 2017-07-28 DIAGNOSIS — H8110 Benign paroxysmal vertigo, unspecified ear: Secondary | ICD-10-CM | POA: Diagnosis not present

## 2017-07-28 DIAGNOSIS — E236 Other disorders of pituitary gland: Secondary | ICD-10-CM

## 2017-07-28 NOTE — Progress Notes (Signed)
NEUROLOGY CONSULTATION NOTE  Adyn Hoes MRN: [id number] DOB: [birth year]  Referring provider: Dr. Coletta Memos Primary care provider: Dr. 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  History supplemented by PCP and oncology notes.  She has had headaches for a few years.  It is not a thunderclap headache.  They are bilateral periorbital and back of neck, non-throbbing pressure and moderate intensity.  They last 2 hours and occur infrequently (she cannot quantify).  There is no associated nausea, vomiting, photophobia, phonophobia, visual disturbance or unilateral numbness or weakness.  They are not aggravated or relieved by anything in particular.  Activity does not aggravate it.  She does not take any pain reliever for it.   For the past year or two, she describes episodic dizziness.  It is a sense of movement but she cannot really elaborate further.  It lasts a couple of seconds.  It is triggered by turning over in bed or getting up from supine position, however it occurs spontaneously as well.  It may last 1 to 2 weeks and occurs periodically.  She takes meclizine which helps.  She went to PT for her back and was found to have a positive Epley maneuver.  She was taught exercises at home, which have not been helpful.  She reports intermittent blurred vision.  She denies visual obscurations or double vision.  She has not had her vision checked for at least 2 1/2 years.    She reports tinnitus but denies pulsatile tinnitus.   MRI of brain without contrast from 05/20/17 was personally reviewed and is unremarkable except for mild expanded empty sella.  She used to take meloxicam daily  for arthritic pain but was told to stop as recent CMP revealed elevated Cr of 1.44.  She was diagnosed with cervical cancer in 2003 and underwent total hysterectomy.  She was diagnosed with B-cell lymphoma in 2014 and has been in remission since 2015.  She denies depression and anxiety.  She has chronic back pain with sciatica, for which she takes gabapentin 300mg  twice daily.  PAST MEDICAL HISTORY: Past Medical History:  Diagnosis Date  . Cancer (HCC)    cervical  . Hypertension   . Lymphoma in remission ([place]) 2015 remission    PAST SURGICAL HISTORY: Past Surgical History:  Procedure Laterality Date  . ABDOMINAL HYSTERECTOMY    . BREAST BIOPSY Right    benign    MEDICATIONS: Current Outpatient Medications on File Prior to Visit  Medication Sig Dispense Refill  . alendronate (FOSAMAX) 70 MG tablet Take 70 mg by mouth once a week. Take with a full glass of water on an empty stomach.    Marland Kitchen atorvastatin (LIPITOR) 20 MG tablet Take 20 mg by mouth daily.    Marland Kitchen azelastine (ASTELIN) 0.1 % nasal spray Place 1 spray into both nostrils 2 (two) times daily. Use in each nostril as directed    . cholecalciferol (VITAMIN D) 1000 units tablet Take 1,000 Units by mouth daily.     . fluticasone (FLONASE) 50 MCG/ACT nasal spray Place 1 spray into both nostrils daily.    Marland Kitchen gabapentin (NEURONTIN) 300 MG capsule Take 300 mg by mouth 2 (two) times daily.    Marland Kitchen  hydrochlorothiazide (HYDRODIURIL) 25 MG tablet Take 25 mg by mouth daily.    Marland Kitchen lisinopril (PRINIVIL,ZESTRIL) 10 MG tablet Take 10 mg by mouth daily.    . meclizine (ANTIVERT) 25 MG tablet Take 25 mg by mouth 2 (two) times daily as needed for dizziness.    . meloxicam (MOBIC) 15 MG tablet Take 15 mg by mouth daily.     No current facility-administered medications on file prior to visit.     ALLERGIES: No Known Allergies  FAMILY HISTORY: Family History  Problem Relation Age of Onset  . Heart disease Mother   . Hypertension Mother   .  Cancer Father   . Renal Disease Father   . Cancer Maternal Grandmother   . Leukemia Maternal Grandfather   . Cancer Paternal Grandmother   . Cancer Paternal Grandfather     SOCIAL HISTORY: Social History   Socioeconomic History  . Marital status: Single    Spouse name: Not on file  . Number of children: 4  . Years of education: Not on file  . Highest education level: 12th grade  Occupational History  . Occupation: disabled  Social Needs  . Financial resource strain: Not on file  . Food insecurity:    Worry: Not on file    Inability: Not on file  . Transportation needs:    Medical: Not on file    Non-medical: Not on file  Tobacco Use  . Smoking status: Never Smoker  . Smokeless tobacco: Never Used  Substance and Sexual Activity  . Alcohol use: No  . Drug use: No  . Sexual activity: Not on file  Lifestyle  . Physical activity:    Days per week: Not on file    Minutes per session: Not on file  . Stress: Not on file  Relationships  . Social connections:    Talks on phone: Not on file    Gets together: Not on file    Attends religious service: Not on file    Active member of club or organization: Not on file    Attends meetings of clubs or organizations: Not on file    Relationship status: Not on file  . Intimate partner violence:    Fear of current or ex partner: Not on file    Emotionally abused: Not on file    Physically abused: Not on file    Forced sexual activity: Not on file  Other Topics Concern  . Not on file  Social History Narrative   Pt is right handed. She is single, lives alone in a 2nd floor apartment. She drinks 2-3 glasses of tea a day. She is active.    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 Allergic/Immunologic: no itchy/runny eyes, nasal congestion, recent allergic reactions, rashes  PHYSICAL EXAM: Vitals:   07/28/17 0736  BP: 108/78  Pulse: 94  SpO2: 94%   General: No acute distress.  Patient appears well-groomed.   Head:  Normocephalic/atraumatic Eyes:  fundi examined but not visualized Neck: supple, no paraspinal tenderness, full range of motion Back: No paraspinal tenderness Heart: regular rate and rhythm Lungs: Clear to auscultation bilaterally. Vascular: No carotid bruits. Neurological Exam: Mental status: alert and oriented to person, place, and time, recent and remote memory intact, fund of knowledge intact, attention and concentration intact, speech fluent and not dysarthric, language intact. Cranial nerves: CN I: not tested CN II: pupils equal, round and reactive to light, visual fields intact CN III, IV, VI:  full range of motion, no nystagmus, no ptosis CN V: facial sensation intact CN VII: upper and lower face symmetric CN VIII: Decreased hearing in left ear CN IX, X: gag intact, uvula midline CN XI: sternocleidomastoid and trapezius muscles intact CN XII: tongue midline Bulk & Tone: normal, no fasciculations. Motor:  5/5 throughout  Sensation: temperature and vibration sensation intact. Deep Tendon Reflexes:  2+ throughout, toes downgoing.  Finger to nose testing:  Without dysmetria.  Heel to shin:  Without dysmetria.  Gait:  Antalgic gait.  Able to turn. Romberg  negative.  IMPRESSION: 1.  Empty sella.  Likely incidental finding and not contributing to headaches or dizziness.  However, given her BMI and reported blurred vision, we must consider this a sign of idiopathic intracranial hypertension. 2.  Suspect headaches are episodic tension-type headaches, not intractable 3.  Dizziness consistent with benign paroxysmal positional vertigo 4.  Intermittent blurred vision 5.  Morbid obesity (BMI 40.11 kg/m2)  PLAN: 1.  Refer to ophthalmology to evaluate for papilledema, which would subsequently require lumbar puncture. 2.  As headaches are manageable and infrequent, no preventative medication will be initiated.  She may treat with Tylenol if needed, limited to no more than 2 days out of week.  If headaches become more frequent, headache diary recommended. 3.  As long as vertigo is brief, may use meclizine but cannot take chronically.  If episodes of vertigo become prolonged, lasting over 2 weeks, recommended vestibular rehabilitation. 4.  Discussed importance of weight loss 5.  Follow up in 3 months.  Thank you for allowing me to take part in the care of this patient.  Metta Clines, DO  CC:  Bernerd Limbo, MD

## 2017-07-28 NOTE — Patient Instructions (Signed)
1.  I don't suspect that the MRI results show anything serious 2.  The headaches are tension type headaches.   3.  The dizziness is benign paroxysmal positional vertigo.  When you get it, take meclizine but you cannot take it all of the time.  If it lasts longer than 2 weeks, we would need to refer you to physical therapy for treatment. 4.  Due to blurred vision, I will send you to the eye doctor 5.  Try to work on weight loss 6.  Follow up in 3 months.

## 2017-08-03 ENCOUNTER — Other Ambulatory Visit: Payer: Self-pay | Admitting: Obstetrics and Gynecology

## 2017-10-04 ENCOUNTER — Other Ambulatory Visit: Payer: Self-pay | Admitting: Obstetrics and Gynecology

## 2017-10-04 DIAGNOSIS — Z1231 Encounter for screening mammogram for malignant neoplasm of breast: Secondary | ICD-10-CM

## 2017-10-11 ENCOUNTER — Other Ambulatory Visit: Payer: Self-pay | Admitting: Obstetrics and Gynecology

## 2017-10-11 DIAGNOSIS — N644 Mastodynia: Secondary | ICD-10-CM

## 2017-11-01 ENCOUNTER — Ambulatory Visit: Payer: Medicare HMO

## 2017-11-01 ENCOUNTER — Ambulatory Visit
Admission: RE | Admit: 2017-11-01 | Discharge: 2017-11-01 | Disposition: A | Discharge status: Home / Self Care | Payer: Medicare HMO | Source: Ambulatory Visit | Attending: Obstetrics and Gynecology | Admitting: Obstetrics and Gynecology

## 2017-11-01 DIAGNOSIS — N644 Mastodynia: Secondary | ICD-10-CM

## 2017-11-12 NOTE — Progress Notes (Signed)
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    UPDATE: She saw ophthalmology (Dr. Kathlen Mody at [facility]) in June.  She did not exhibit any disc edema.    No recent dizziness.  No recent headaches.  She was seen by nephrology for acute kidney insufficiency. It is thought to be secondary to NSAID with lisinopril/Cozaar use.  HISTORY: She has had headaches for a few years.  It is not a thunderclap headache.  They are bilateral periorbital and back of neck, non-throbbing pressure and moderate intensity.  They last 2 hours and occur infrequently (she cannot quantify).  There is no associated nausea, vomiting, photophobia, phonophobia, visual disturbance or unilateral numbness or weakness.  They are not aggravated or relieved by anything in particular.  Activity does not aggravate it.  She does not take any pain reliever for it.   For the past year or two, she describes episodic dizziness.  It is a sense of movement but she cannot really elaborate further.  It lasts a couple of seconds.  It is triggered by turning over in bed or getting up from supine position, however it occurs spontaneously as well.  It may last 1 to 2 weeks and occurs periodically.  She takes meclizine which helps.  She went to PT for her back and was found to have a positive Dix-Hallpike maneuver.  She was taught exercises at home, which have not been helpful.  She reports intermittent blurred vision.  She denies visual obscurations or double vision.  She has not had her vision checked for at least 2 1/2 years.    She reports tinnitus but denies pulsatile tinnitus.   MRI of brain without contrast from  05/20/17 was personally reviewed and is unremarkable except for mild expanded empty sella.  She used to take meloxicam daily for arthritic pain but was told to stop as recent CMP revealed elevated Cr of 1.44.  She was diagnosed with cervical cancer in 2003 and underwent total hysterectomy.  She was diagnosed with B-cell lymphoma in 2014 and has been in remission since 2015.  She denies depression and anxiety.    PAST MEDICAL HISTORY: Past Medical History:  Diagnosis Date  . Cancer (HCC)    cervical  . Hypertension   . Lymphoma in remission ([place]) 2015 remission    MEDICATIONS: Current Outpatient Medications on File Prior to Visit  Medication Sig Dispense Refill  . alendronate (FOSAMAX) 70 MG tablet Take 70 mg by mouth once a week. Take with a full glass of water on an empty stomach.    Marland Kitchen atorvastatin (LIPITOR) 20 MG tablet Take 20 mg by mouth daily.    Marland Kitchen azelastine (ASTELIN) 0.1 % nasal spray Place 1 spray into both nostrils 2 (two) times daily. Use in each nostril as directed    . cholecalciferol (VITAMIN D) 1000 units tablet Take 1,000 Units by mouth daily.     . fluticasone (FLONASE) 50 MCG/ACT nasal spray Place 1 spray into both nostrils daily.    Marland Kitchen gabapentin (NEURONTIN) 300 MG capsule Take 300 mg by mouth 2 (two) times daily.    . hydrochlorothiazide (HYDRODIURIL) 25 MG tablet Take 25 mg by mouth daily.    Marland Kitchen  lisinopril (PRINIVIL,ZESTRIL) 10 MG tablet Take 10 mg by mouth daily.    . meclizine (ANTIVERT) 25 MG tablet Take 25 mg by mouth 2 (two) times daily as needed for dizziness.    . meloxicam (MOBIC) 15 MG tablet Take 15 mg by mouth daily.     No current facility-administered medications on file prior to visit.     ALLERGIES: No Known Allergies  FAMILY HISTORY: Family History  Problem Relation Age of Onset  . Heart disease Mother   . Hypertension Mother   . Cancer Father   . Renal Disease Father   . Cancer Maternal Grandmother   . Leukemia Maternal Grandfather   .  Cancer Paternal Grandmother   . Cancer Paternal Grandfather    SOCIAL HISTORY: Social History   Socioeconomic History  . Marital status: Single    Spouse name: Not on file  . Number of children: 4  . Years of education: Not on file  . Highest education level: 12th grade  Occupational History  . Occupation: disabled  Social Needs  . Financial resource strain: Not on file  . Food insecurity:    Worry: Not on file    Inability: Not on file  . Transportation needs:    Medical: Not on file    Non-medical: Not on file  Tobacco Use  . Smoking status: Never Smoker  . Smokeless tobacco: Never Used  Substance and Sexual Activity  . Alcohol use: No  . Drug use: No  . Sexual activity: Not on file  Lifestyle  . Physical activity:    Days per week: Not on file    Minutes per session: Not on file  . Stress: Not on file  Relationships  . Social connections:    Talks on phone: Not on file    Gets together: Not on file    Attends religious service: Not on file    Active member of club or organization: Not on file    Attends meetings of clubs or organizations: Not on file    Relationship status: Not on file  . Intimate partner violence:    Fear of current or ex partner: Not on file    Emotionally abused: Not on file    Physically abused: Not on file    Forced sexual activity: Not on file  Other Topics Concern  . Not on file  Social History Narrative   Pt is right handed. She is single, lives alone in a 2nd floor apartment. She drinks 2-3 glasses of tea a day. She is active.    REVIEW OF SYSTEMS: Constitutional: No fevers, chills, or sweats, no generalized fatigue, change in appetite Eyes: No visual changes, double vision, eye pain Ear, nose and throat: No hearing loss, ear pain, nasal congestion, sore throat Cardiovascular: No chest pain, palpitations Respiratory:  No shortness of breath at rest or with exertion, wheezes GastrointestinaI: No nausea, vomiting, diarrhea,  abdominal pain, fecal incontinence Genitourinary:  No dysuria, urinary retention or frequency Musculoskeletal:  No neck pain, positive for back pain Integumentary: No rash, pruritus, skin lesions Neurological: as above Psychiatric: No depression, insomnia, anxiety Endocrine: No palpitations, fatigue, diaphoresis, mood swings, change in appetite, change in weight, increased thirst Hematologic/Lymphatic:  No purpura, petechiae. Allergic/Immunologic: no itchy/runny eyes, nasal congestion, recent allergic reactions, rashes  PHYSICAL EXAM: Blood pressure 124/90, pulse 94, height 6\' 1"  (1.854 m), weight (!) 310 lb (140.6 kg), SpO2 91 %. General: No acute distress.  Patient appears well-groomed. Head:  Normocephalic/atraumatic Eyes:  Fundi examined but not visualized Neck: supple, no paraspinal tenderness, full range of motion Heart:  Regular rate and rhythm Lungs:  Clear to auscultation bilaterally Back: paraspinal tenderness Neurological Exam: alert and oriented to person, place, and time. Attention span and concentration intact, recent and remote memory intact, fund of knowledge intact.  Speech fluent and not dysarthric, language intact.  Decreased hearing in left ear.  Otherwise, CN II-XII intact. Bulk and tone normal, muscle strength 5/5 throughout.  Sensation to light touch, temperature and vibration intact.  Deep tendon reflexes 2+ throughout, toes downgoing.  Finger to nose and heel to shin testing intact.  Gait antalgic, Romberg negative.  IMPRESSION: 1.  Empty sella.  Likely normal variant. 2.  episodic tension-type headaches, not intractable 3.  benign paroxysmal positional vertigo, resolved 4.  Morbid obesity (BMI 40.90 kg/m2)  PLAN: Follow up as needed Continue to work on weight loss  18 minutes spent face to face with patient, over 50% spent discussing management and exam results.   Metta Clines, DO  CC: Bernerd Limbo, MD

## 2017-11-16 ENCOUNTER — Encounter: Payer: Self-pay | Admitting: Neurology

## 2017-11-16 ENCOUNTER — Ambulatory Visit (INDEPENDENT_AMBULATORY_CARE_PROVIDER_SITE_OTHER): Payer: Medicare HMO | Admitting: Neurology

## 2017-11-16 VITALS — BP 124/90 | HR 94 | Ht 73.0 in | Wt 310.0 lb

## 2017-11-16 DIAGNOSIS — E236 Other disorders of pituitary gland: Secondary | ICD-10-CM

## 2017-11-16 DIAGNOSIS — H8110 Benign paroxysmal vertigo, unspecified ear: Secondary | ICD-10-CM

## 2017-11-16 DIAGNOSIS — G44219 Episodic tension-type headache, not intractable: Secondary | ICD-10-CM

## 2017-11-16 NOTE — Patient Instructions (Signed)
Everything looks okay from a neurologic perspective.  Follow up as needed.

## 2018-01-10 NOTE — Progress Notes (Signed)
Marland Kitchen    HEMATOLOGY/ONCOLOGY CLINIC NOTE  Date of Service: 01/11/2018  Patient Care Team: Bernerd Limbo, MD as PCP - General (Family Medicine)  CHIEF COMPLAINTS/PURPOSE OF CONSULTATION:  f/u for Diffuse large B-cell lymphoma.   HISTORY OF PRESENTING ILLNESS:   Tracey Stevens is a wonderful 57 y.o. 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  Patient has a history of cervical cancer diagnosed in 2003 in [place] treated with total abdominal hysterectomy. Has been in remission since. He did not require any chemoradiation. Bilateral ovaries intact per patient.  She presented to Dr. Alferd Patee MD at [facility] in October 2014 with left-sided abdominal and chest pain and was found to have abnormal liver function tests. Subsequent CT chest abdomen pelvis showed large hepatic lesions. Biopsy of one of the liver lesions in the right lobe of the liver on 12/05/2012 showed diffuse large B-cell lymphoma with a Ki-67 of more than 95%. Bone marrow biopsy was done on 12/29/2012 and was noted to be negative for lymphoma. Patient subsequently had a PET/CT scan which showed lymphoma involving the liver, spleen, mesenteric lymph nodes as well as a T12 fracture for which she was given a back brace.  Patient has been treated with 6 cycles of R CHOP for suspected double hit DL BCL and received 4 intrathecal doses of methotrexate for CNS prophylaxis. PET/CT scan done on 08/31/2013 showed no evidence of residual lymphoma. She was noted to have bilateral nodular densities in bilateral lower lungs which were thought to be due to inflammation/infection and she was empirically treated with levofloxacin for one week.  She has had issues with back pain intermittently and significant renal stones. She notes that she had her parathyroids removed for hyperparathyroidism.  She subsequently moved from [facility] to  [place] and was seen on 08/05/2015 by Dr. Marcine Matar at the [facility] in [place] and had labs including a normal LDH of 135, normal CBC and normal chemistries  on 08/05/2015. She subsequently had a CT chest abdomen pelvis on 08/16/2015 that showed no evidence of abnormal lymphadenopathy within the chest abdomen or pelvis.  Noted to have an indeterminate 1.5 cm left adrenal gland nodule. Severe remote compression deformity of T12. She was noted to have left sided nephrolithiasis and simple appearing bilateral renal cysts for which she has been validated by Dr. Festus Aloe at [facility] urology.  Patient notes no fevers no chills no drenching night sweats. No weight loss. No other specific new focal symptoms at this time.  Notes that she has had issues with chronic back pains for which she has been using a brace. She has been validated by neurosurgery Dr. Sherwood Gambler and a decision has been made against surgery at this time. Has been using naproxen for her back pain.  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  INTERVAL HISTORY   [name] is here for her scheduled 6 month follow-up for diffuse large B-cell lymphoma. The patient's last visit with [place] was on 07/13/17. The pt reports that she is doing well overall.   The pt reports that she is pursuing work up for her left sided sciatica and is trying to avoid climbing stairs. She did join a gym in the interim and is attending  twice a week.   The pt denies noticing any new lumps or bumps and denies any constitutional symptoms.   The pt continues on Losartan and was taken on Meloxicam and all anti-inflammatory medications.   Lab results today (01/10/18) of CBC w/diff, and Reticulocytes is as follows: all values are WNL.  01/11/18 LDH is 168.  On review of systems, pt reports stable energy levels,  staying active, stable ankle swelling, and denies fevers, chills, night sweats, unexpected weight loss, noticing any new lumps or bumps, new abdominal pains, new bone pains, new back pains, and any other symptoms.   MEDICAL HISTORY:  Past Medical History:  Diagnosis Date  . Cancer (HCC)    cervical  . Hypertension   . 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  Vertigo-likely due to either etiology Adrenal nodule Renal cysts Nephrolithiasis Hypertension T12 compression fracture Multinodular goiter Primary hyperparathyroidism status post parathyroidectomy Vitamin D deficiency Left-sided conductive hearing loss   SURGICAL HISTORY: Past Surgical History:  Procedure Laterality Date  . ABDOMINAL HYSTERECTOMY    . BREAST BIOPSY Right over 10 years ago   benign  Biopsy of liver lesion noted to be diffuse large B-cell lymphoma in 2014. Parathyroidectomy-subtotal History of left ear surgery History of chemotherapy  SOCIAL HISTORY: Social History   Socioeconomic History  . Marital status: Single    Spouse name: Not on file  . Number of children: 4  . Years of education: Not on file  . Highest education level: 12th grade  Occupational History  . Occupation: disabled  Social Needs  . Financial resource strain: Not on file  . Food insecurity:    Worry: Not on file    Inability: Not on file  . Transportation needs:    Medical: Not on file    Non-medical: Not on file  Tobacco Use  . Smoking status: Never Smoker  . Smokeless tobacco: Never Used  Substance and Sexual Activity  . Alcohol use: No  . Drug use: No  . Sexual activity: Not on file  Lifestyle  . Physical activity:    Days per week: Not on file    Minutes per session: Not on file  . Stress: Not on file  Relationships  . Social  connections:    Talks on phone: Not on file    Gets together: Not on file    Attends religious service: Not on file    Active member of club or organization: Not on file    Attends meetings of clubs or organizations: Not on file    Relationship status: Not on file  . Intimate partner violence:    Fear of current or ex partner: Not on file    Emotionally abused: Not on file    Physically abused: Not on file    Forced sexual activity: Not on file  Other Topics Concern  . Not on file  Social History Narrative   Pt is right handed. She is single, lives alone in a 2nd floor apartment. She drinks 2-3 glasses of tea a day. She is active.  Moved from [place] to [facility] about one year ago  FAMILY HISTORY:  Family history of coronary artery disease -mother  Hypertension-mother -Gastric cancer-father Renal failure father   ALLERGIES:  has No Known Allergies.  MEDICATIONS:  Current Outpatient  Medications  Medication Sig Dispense Refill  . alendronate (FOSAMAX) 70 MG tablet Take 70 mg by mouth once a week. Take with a full glass of water on an empty stomach.    Marland Kitchen atorvastatin (LIPITOR) 20 MG tablet Take 20 mg by mouth daily.    Marland Kitchen azelastine (ASTELIN) 0.1 % nasal spray Place 1 spray into both nostrils 2 (two) times daily. Use in each nostril as directed    . cholecalciferol (VITAMIN D) 1000 units tablet Take 1,000 Units by mouth daily.     Marland Kitchen gabapentin (NEURONTIN) 300 MG capsule Take 300 mg by mouth 2 (two) times daily.    . hydrochlorothiazide (HYDRODIURIL) 25 MG tablet Take 25 mg by mouth daily.    Marland Kitchen lisinopril (PRINIVIL,ZESTRIL) 10 MG tablet Take 10 mg by mouth daily.    Marland Kitchen losartan (COZAAR) 25 MG tablet Take by mouth.    . meclizine (ANTIVERT) 25 MG tablet Take 25 mg by mouth 2 (two) times daily as needed for dizziness.     No current facility-administered medications for this visit.     REVIEW OF SYSTEMS:    A 10+ POINT REVIEW OF SYSTEMS WAS OBTAINED  including neurology, dermatology, psychiatry, cardiac, respiratory, lymph, extremities, GI, GU, Musculoskeletal, constitutional, breasts, reproductive, HEENT.  All pertinent positives are noted in the HPI.  All others are negative.   PHYSICAL EXAMINATION: ECOG PERFORMANCE STATUS: 1 - Symptomatic but completely ambulatory  . Vitals:   01/11/18 1316  BP: (!) 142/94  Pulse: 92  Resp: (!) 22  Temp: 98.3 F (36.8 C)  SpO2: 98%   Filed Weights   01/11/18 1316  Weight: (!) 322 lb 9.6 oz (146.3 kg)   .Body mass index is 42.56 kg/m.  GENERAL:alert, in no acute distress and comfortable SKIN: no acute rashes, no significant lesions EYES: conjunctiva are pink and non-injected, sclera anicteric OROPHARYNX: MMM, no exudates, no oropharyngeal erythema or ulceration NECK: supple, no JVD LYMPH:  no palpable lymphadenopathy in the cervical, axillary or inguinal regions LUNGS: clear to auscultation b/l with normal respiratory effort HEART: regular rate & rhythm ABDOMEN:  normoactive bowel sounds , non tender, not distended. No palpable hepatosplenomegaly.  Extremity: trace pedal edema PSYCH: alert & oriented x 3 with fluent speech NEURO: no focal motor/sensory deficits   LABORATORY DATA:  I have reviewed the data as listed  . CBC Latest Ref Rng & Units 01/11/2018 07/13/2017 02/25/2017  WBC 4.0 - 10.5 K/uL 5.4 3.8(L) 5.5  Hemoglobin 12.0 - 15.0 g/dL 12.7 13.2 13.9  Hematocrit 36.0 - 46.0 % 37.7 38.8 39.6  Platelets 150 - 400 K/uL 264 279 317   . CBC    Component Value Date/Time   WBC 5.4 01/11/2018 1304   WBC 3.8 (L) 07/13/2017 0922   RBC 4.14 01/11/2018 1304   RBC 4.14 01/11/2018 1304   HGB 12.7 01/11/2018 1304   HGB 13.1 01/05/2017 1047   HCT 37.7 01/11/2018 1304   HCT 38.9 01/05/2017 1047   PLT 264 01/11/2018 1304   PLT 279 01/05/2017 1047   MCV 91.1 01/11/2018 1304   MCV 91.1 01/05/2017 1047   MCH 30.7 01/11/2018 1304   MCHC 33.7 01/11/2018 1304   RDW 12.6 01/11/2018  1304   RDW 13.3 01/05/2017 1047   LYMPHSABS 2.1 01/11/2018 1304   LYMPHSABS 1.7 01/05/2017 1047   MONOABS 0.6 01/11/2018 1304   MONOABS 0.4 01/05/2017 1047   EOSABS 0.2 01/11/2018 1304   EOSABS 0.2 01/05/2017 1047   BASOSABS 0.0 01/11/2018 1304  BASOSABS 0.0 01/05/2017 1047    . CMP Latest Ref Rng & Units 01/11/2018 07/13/2017 02/25/2017  Glucose 70 - 99 mg/dL 90 82 97  BUN 6 - 20 mg/dL 16 18 15   Creatinine 0.44 - 1.00 mg/dL 1.56(H) 1.44(H) 0.89  Sodium 135 - 145 mmol/L 146(H) 144 140  Potassium 3.5 - 5.1 mmol/L 4.1 3.9 3.3(L)  Chloride 98 - 111 mmol/L 106 107 105  CO2 22 - 32 mmol/L 31 29 28   Calcium 8.9 - 10.3 mg/dL 10.2 10.4 9.2  Total Protein 6.5 - 8.1 g/dL 7.2 7.1 7.2  Total Bilirubin 0.3 - 1.2 mg/dL 0.5 0.6 0.9  Alkaline Phos 38 - 126 U/L 61 49 56  AST 15 - 41 U/L 15 15 14(L)  ALT 0 - 44 U/L 12 9 14    . Lab Results  Component Value Date   LDH 168 01/11/2018     CT Chest Abdomen Pelvis W Contrast6/04/2015 [facility] Result Impression  IMPRESSION: 1.No evidence of abnormal lymphadenopathy within the chest, abdomen, or pelvis.  2.Indeterminate 1.5 cm left adrenal gland nodule.  3.Severe remote compression deformity of T12 which may reflect remote history of lymphoma.  4.Hepatic cyst  5. Simple appearing bilateral renal cysts. Left-sided nephrolithiasis.  6.Small hiatal hernia.  Result Narrative  INDICATION: C83.30: Diffuse large B-cell lymphoma, unspecified site.  COMPARISON:None available.  TECHNIQUE:CT CHEST ABDOMEN PELVIS W CONTRAST -100 mL Isovue 370 were administered intravenously. Dose reduction was utilized (automated exposure control, mA or kV adjustment based on patient size, or iterative image reconstruction).  FINDINGS:   SUPPORT APPARATUS: N.A.   LUNGS/PLEURA: No focal airspace opacities/consolidations. No pneumothorax.  No abnormal pulmonary masses.  No pleural effusions.  HEART/MEDIASTINUM: Cardiac  size is normal. No acute thoracic aortic abnormalities.  No hilar, mediastinal, or axillary lymphadenopathy.  SOLID ABDOMINAL VISCERA: Liver: Small hepatic cysts measures 1.8 cm.. Gallbladder: Contracted Pancreas: Unremarkable. Adrenal glands: Indeterminate left gland nodule measuring 1.3 x 1.5 cm. Normal right adrenal gland. Spleen: Unremarkable. Kidneys: Multiple nonobstructing calculi within the left lower pole renal calyx largest measuring 8 mm. No hydronephrosis or perinephric stranding bilaterally. There are multiple bilateral renal cysts larger of which are seen on the left measuring up to  6.5 cm in size. There are smaller right renal cysts largest 1.2 cm.  GI: No bowel obstruction. Small hiatal hernia. No focal bowel wall thickening or inflammatory changes. Normal appendix.   PERITONEAL CAVITY/RETROPERITONEUM: No free fluid. No pneumoperitoneum. No lymphadenopathy.  PELVIS: No acute abnormalities.  MUSCULOSKELETAL: No acute or destructive osseous processes.  MISC./CHRONIC FINDINGS: Atherosclerotic calcifications are present, particularly involving the visualized aorta. Chronic/degenerative changes at L5-S1. Remote severe compression deformity of T12.   RADIOGRAPHIC STUDIES: I have personally reviewed the radiological images as listed and agreed with the findings in the report. No results found.   MRI Brain 05/20/17 at [place]: #Skull/marrow/soft tissues: No calvarial or skull base lesion. #Orbits: Unremarkable. #Sinuses: Unremarkable. #Brain: Overall parenchymal volume appropriate for age. No significant signal abnormality appreciated within the brain parenchyma. In particular, no mass, hemorrhage or infarction. No significant white matter disease. Mild expanded empty sella. No  midline shift, hydrocephalus or extra-axial collections. Flow is demonstrated within the major vessels at the base of the brain.  MRI Spine Lumbar 03/18/17 at  [place]:  1. Old compression fracture at T12 2. Severe left neuroforaminal stenosis and moderate to severe right neuroforaminal stenosis at L5-S1 due to disc bulge and facet arthritis  3. Otherwise mild degenerative changes  ASSESSMENT & PLAN:   57 y.o. African-American female with  #1 history of stage IV diffuse large B-cell lymphoma possibly double hit. Diagnosed October 2014. Patient was treated with R CHOP 6 cycles and CNS prophylaxis with intrathecal methotrexate 4 doses. She is now about 4 years out and has been in complete remission. She has no constitutional symptoms at this time or palpable lymphadenopathy or hepatosplenomegaly or other focal symptoms to suggest lymphoma recurrence at this time. Her last CT chest abdomen pelvis on 08/16/2015 were negative for evidence of lymphoma.  PLAN:   -Discussed pt labwork today, 01/10/18; blood counts are normal -Pt is now 5 years out from treatment -Recommended that the pt receive her annual flu vaccine as well as both pneumonia vaccines every 5 years -The pt shows no clinical or lab progression/return of DLBCL at this time.  -No indication for further treatment at this time.   -Recommended that the pt continue to eat well, drink at least 48-64 oz of water each day, and walk 20-30 minutes each day.  -Will begin seeing the pt once a year as she is 5 years out from treatment  -Unless any concerning findings on labs or patient has new concerning symptoms would hold off on additional scheduled imaging for DLBCL surveillance at this time. -She was previously counseled on concerning symptoms to monitor for an call us with.  #2 left-sided nephrolithiasis with renal cysts-being followed by Festus Aloe with the [facility] urology. -kidney stone is stable, her urologist is considering Cystoscopy   #3 chronic back pains due to degenerative disc disease and compression fracture at T12 from [place] with neurosurgery  Dr. Sherwood Gambler. -She is now under pain management. Her naproxen was recently changed to Meloxicam in 06/2017.  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  -Seen on 05/20/17 brain MRI  -Continue to be followed by neurologist.    RTC with Dr Irene Limbo with labs in 12 months   All of the patients questions were answered with apparent satisfaction. The patient knows to call the clinic with any problems, questions or concerns.  The total time spent in the appt was 20 minutes and more than 50% was on counseling and direct patient cares.    Sullivan Lone MD MS AAHIVMS [facility] [place] Hematology/Oncology Physician [facility]  (Office):       [phone] (Work cell):  [phone] (Fax):           [phone]  01/11/2018 2:06 PM  I, Baldwin Jamaica, am acting as a scribe for Dr. Irene Limbo  .I have reviewed the above documentation for accuracy and completeness, and I agree with the above. Brunetta Genera MD

## 2018-01-11 ENCOUNTER — Encounter: Payer: Self-pay | Admitting: Hematology

## 2018-01-11 ENCOUNTER — Telehealth: Payer: Self-pay

## 2018-01-11 ENCOUNTER — Inpatient Hospital Stay: Payer: Medicare HMO | Attending: Hematology | Admitting: Hematology

## 2018-01-11 ENCOUNTER — Inpatient Hospital Stay: Payer: Medicare HMO

## 2018-01-11 VITALS — BP 142/94 | HR 92 | Temp 98.3°F | Resp 22 | Ht 73.0 in | Wt 322.6 lb

## 2018-01-11 DIAGNOSIS — C8338 Diffuse large B-cell lymphoma, lymph nodes of multiple sites: Secondary | ICD-10-CM

## 2018-01-11 DIAGNOSIS — Z8572 Personal history of non-Hodgkin lymphomas: Secondary | ICD-10-CM | POA: Diagnosis present

## 2018-01-11 LAB — CMP (CANCER CENTER ONLY)
ALBUMIN: 3.7 g/dL (ref 3.5–5.0)
ALK PHOS: 61 U/L (ref 38–126)
ALT: 12 U/L (ref 0–44)
AST: 15 U/L (ref 15–41)
Anion gap: 9 (ref 5–15)
BILIRUBIN TOTAL: 0.5 mg/dL (ref 0.3–1.2)
BUN: 16 mg/dL (ref 6–20)
CALCIUM: 10.2 mg/dL (ref 8.9–10.3)
CO2: 31 mmol/L (ref 22–32)
CREATININE: 1.56 mg/dL — AB (ref 0.44–1.00)
Chloride: 106 mmol/L (ref 98–111)
GFR, Est AFR Am: 41 mL/min — ABNORMAL LOW (ref >60)
GFR, Estimated: 36 mL/min — ABNORMAL LOW (ref >60)
GLUCOSE: 90 mg/dL (ref 70–99)
Potassium: 4.1 mmol/L (ref 3.5–5.1)
SODIUM: 146 mmol/L — AB (ref 135–145)
Total Protein: 7.2 g/dL (ref 6.5–8.1)

## 2018-01-11 LAB — RETICULOCYTES
Immature Retic Fract: 10.7 % (ref 2.3–15.9)
RBC.: 4.14 MIL/uL (ref 3.87–5.11)
RETIC COUNT ABSOLUTE: 61.7 10*3/uL (ref 19.0–186.0)
Retic Ct Pct: 1.5 % (ref 0.4–3.1)

## 2018-01-11 LAB — CBC WITH DIFFERENTIAL (CANCER CENTER ONLY)
Abs Immature Granulocytes: 0.02 10*3/uL (ref 0.00–0.07)
Basophils Absolute: 0 10*3/uL (ref 0.0–0.1)
Basophils Relative: 0 %
EOS ABS: 0.2 10*3/uL (ref 0.0–0.5)
EOS PCT: 4 %
HEMATOCRIT: 37.7 % (ref 36.0–46.0)
Hemoglobin: 12.7 g/dL (ref 12.0–15.0)
IMMATURE GRANULOCYTES: 0 %
LYMPHS ABS: 2.1 10*3/uL (ref 0.7–4.0)
Lymphocytes Relative: 39 %
MCH: 30.7 pg (ref 26.0–34.0)
MCHC: 33.7 g/dL (ref 30.0–36.0)
MCV: 91.1 fL (ref 80.0–100.0)
MONO ABS: 0.6 10*3/uL (ref 0.1–1.0)
Monocytes Relative: 11 %
Neutro Abs: 2.5 10*3/uL (ref 1.7–7.7)
Neutrophils Relative %: 46 %
Platelet Count: 264 10*3/uL (ref 150–400)
RBC: 4.14 MIL/uL (ref 3.87–5.11)
RDW: 12.6 % (ref 11.5–15.5)
WBC Count: 5.4 10*3/uL (ref 4.0–10.5)
nRBC: 0 % (ref 0.0–0.2)

## 2018-01-11 LAB — LACTATE DEHYDROGENASE: LDH: 168 U/L (ref 98–192)

## 2018-01-11 NOTE — Telephone Encounter (Signed)
Printed avs and calender of upcoming appointment. Per 10/29 los 

## 2018-01-11 NOTE — Patient Instructions (Signed)
Thank you for choosing [facility] to provide your oncology and hematology care.  To afford each patient quality time with our providers, please arrive 30 minutes before your scheduled appointment time.  If you arrive late for your appointment, you may be asked to reschedule.  We strive to give you quality time with our providers, and arriving late affects you and other patients whose appointments are after yours.    If you are a no show for multiple scheduled visits, you may be dismissed from the clinic at the providers discretion.     Again, thank you for choosing [facility], our hope is that these requests will decrease the amount of time that you wait before being seen by our physicians.  ______________________________________________________________________   Should you have questions after your visit to the [facility], please contact our office at [phone] between the hours of 8:30 and 4:30 p.m.    Voicemails left after 4:30p.m will not be returned until the following business day.     For prescription refill requests, please have your pharmacy contact us directly.  Please also try to allow 48 hours for prescription requests.     Please contact the scheduling department for questions regarding scheduling.  For scheduling of procedures such as PET scans, CT scans, MRI, Ultrasound, etc please contact central scheduling at [phone].     Resources For Cancer Patients and Caregivers:    Oncolink.org:  A wonderful resource for patients and healthcare providers for information regarding your disease, ways to tract your treatment, what to expect, etc.      American Cancer Society:  800-227-2345  Can help patients locate various types of support and financial assistance   Cancer Care: 1-800-813-HOPE (4673) Provides financial assistance, online support groups, medication/co-pay assistance.     [place] DSS:  336-641-3447 Where to apply  for food stamps, Medicaid, and utility assistance   Medicare Rights Center: 800-333-4114 Helps people with Medicare understand their rights and benefits, navigate the Medicare system, and secure the quality healthcare they deserve   SCAT: 336-333-6589 [place] [employer]'s shared-ride transportation service for eligible riders who have a disability that prevents them from riding the fixed route bus.     For additional information on assistance programs please contact our social worker:   Abigail Elmore:  [phone]  

## 2018-09-22 DIAGNOSIS — E559 Vitamin D deficiency, unspecified: Secondary | ICD-10-CM | POA: Insufficient documentation

## 2018-09-26 ENCOUNTER — Other Ambulatory Visit: Payer: Self-pay | Admitting: Obstetrics and Gynecology

## 2018-09-26 ENCOUNTER — Other Ambulatory Visit: Payer: Self-pay | Admitting: Family Medicine

## 2018-09-26 DIAGNOSIS — Z1231 Encounter for screening mammogram for malignant neoplasm of breast: Secondary | ICD-10-CM

## 2018-11-09 ENCOUNTER — Ambulatory Visit
Admission: RE | Admit: 2018-11-09 | Discharge: 2018-11-09 | Disposition: A | Discharge status: Home / Self Care | Payer: Medicare HMO | Source: Ambulatory Visit | Attending: Family Medicine | Admitting: Family Medicine

## 2018-11-09 ENCOUNTER — Other Ambulatory Visit: Payer: Self-pay

## 2018-11-09 DIAGNOSIS — Z1231 Encounter for screening mammogram for malignant neoplasm of breast: Secondary | ICD-10-CM

## 2019-01-09 NOTE — Progress Notes (Signed)
Tracey Stevens    HEMATOLOGY/ONCOLOGY CLINIC NOTE  Date of Service: 01/10/2019  Patient Care Team: Bernerd Limbo, MD as PCP - General (Family Medicine)  CHIEF COMPLAINTS/PURPOSE OF CONSULTATION:  f/u for Diffuse large B-cell lymphoma.   HISTORY OF PRESENTING ILLNESS:   Tracey Stevens is a wonderful 58 y.o. 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  Patient has a history of cervical cancer diagnosed in 2003 in [place] treated with total abdominal hysterectomy. Has been in remission since. He did not require any chemoradiation. Bilateral ovaries intact per patient.  She presented to Dr. Alferd Patee MD at [facility] in October 2014 with left-sided abdominal and chest pain and was found to have abnormal liver function tests. Subsequent CT chest abdomen pelvis showed large hepatic lesions. Biopsy of one of the liver lesions in the right lobe of the liver on 12/05/2012 showed diffuse large B-cell lymphoma with a Ki-67 of more than 95%. Bone marrow biopsy was done on 12/29/2012 and was noted to be negative for lymphoma. Patient subsequently had a PET/CT scan which showed lymphoma involving the liver, spleen, mesenteric lymph nodes as well as a T12 fracture for which she was given a back brace.  Patient has been treated with 6 cycles of R CHOP for suspected double hit DL BCL and received 4 intrathecal doses of methotrexate for CNS prophylaxis. PET/CT scan done on 08/31/2013 showed no evidence of residual lymphoma. She was noted to have bilateral nodular densities in bilateral lower lungs which were thought to be due to inflammation/infection and she was empirically treated with levofloxacin for one week.  She has had issues with back pain intermittently and significant renal stones. She notes that she had her parathyroids removed for hyperparathyroidism.  She subsequently moved from [facility] to  [place] and was seen on 08/05/2015 by Dr. Marcine Matar at the [facility] in [place] and had labs including a normal LDH of 135, normal CBC and normal chemistries  on 08/05/2015. She subsequently had a CT chest abdomen pelvis on 08/16/2015 that showed no evidence of abnormal lymphadenopathy within the chest abdomen or pelvis.  Noted to have an indeterminate 1.5 cm left adrenal gland nodule. Severe remote compression deformity of T12. She was noted to have left sided nephrolithiasis and simple appearing bilateral renal cysts for which she has been validated by Dr. Festus Aloe at [facility] urology.  Patient notes no fevers no chills no drenching night sweats. No weight loss. No other specific new focal symptoms at this time.  Notes that she has had issues with chronic back pains for which she has been using a brace. She has been validated by neurosurgery Dr. Sherwood Gambler and a decision has been made against surgery at this time. Has been using naproxen for her back pain.  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  INTERVAL HISTORY   [name] is here for her scheduled 12 month follow-up for diffuse large B-cell lymphoma. The patient's last visit with [place] was on 01/11/2018. The pt reports that she is doing well overall.  The pt reports that her PCP has switched her from Hydrochlorothiazide to Furosemide BID due to excessive leg swelling. The leg swelling has been going on for awhile but began to intensify a  couple of months ago and is worse in the right leg. It hurts from her hip down when she attempts to elevate her right leg. Pt denies any vein or blood clot issues in her right leg. She has a herniated disc and two fractured discs. Pt wears her compression socks regularly. She is now taking four doses of Vitamin D 1000 units. Pt continues to take Gabapentin  and Losartan and is no longer on Lisoinopril. She still has kidney stones but they are not enlarging so they will leave them alone for now. Pt has recently moved to a new apartment which has disability accommodations and it is helping her get around easier.   Lab results today (01/10/19) of CBC w/diff and CMP is as follows: all values are WNL except for Glucose at 114, Creatinine at 2.03, GFR Est AFR Am at 30. 01/10/2019 LDH at 161  On review of systems, pt reports leg swelling that's worse in the right leg, right leg pain when elevating and denies fevers, chills, night sweats, unexpected weight loss, abdominal pain, bowel movement issues and any other symptoms.   MEDICAL HISTORY:  Past Medical History:  Diagnosis Date  . Cancer (HCC)    cervical  . Hypertension   . 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  Vertigo-likely due to either etiology Adrenal nodule Renal cysts Nephrolithiasis Hypertension T12 compression fracture Multinodular goiter Primary hyperparathyroidism status post parathyroidectomy Vitamin D deficiency Left-sided conductive hearing loss   SURGICAL HISTORY: Past Surgical History:  Procedure Laterality Date  . ABDOMINAL HYSTERECTOMY    . BREAST BIOPSY Right over 10 years ago   benign  Biopsy of liver lesion noted to be diffuse large B-cell lymphoma in 2014. Parathyroidectomy-subtotal History of left ear surgery History of chemotherapy  SOCIAL HISTORY: Social History   Socioeconomic History  . Marital status: Single    Spouse name: Not on file  . Number of children: 4  . Years of education: Not on file  . Highest education level: 12th grade  Occupational History  . Occupation: disabled  Social Needs  . Financial resource strain: Not on file  . Food insecurity    Worry:  Not on file    Inability: Not on file  . Transportation needs    Medical: Not on file    Non-medical: Not on file  Tobacco Use  . Smoking status: Never Smoker  . Smokeless tobacco: Never Used  Substance and Sexual Activity  . Alcohol use: No  . Drug use: No  . Sexual activity: Not on file  Lifestyle  . Physical activity    Days per week: Not on file    Minutes per session: Not on file  . Stress: Not on file  Relationships  . Social Herbalist on phone: Not on file    Gets together: Not on file    Attends religious service: Not on file    Active member of club or organization: Not on file    Attends meetings of clubs or organizations: Not on file    Relationship status: Not on file  . Intimate partner violence    Fear of current or ex partner: Not on file    Emotionally abused: Not on file    Physically abused: Not on file    Forced sexual activity: Not on  file  Other Topics Concern  . Not on file  Social History Narrative   Pt is right handed. She is single, lives alone in a 2nd floor apartment. She drinks 2-3 glasses of tea a day. She is active.  Moved from [place] to [facility] about one year ago  FAMILY HISTORY:  Family history of coronary artery disease -mother  Hypertension-mother -Gastric cancer-father Renal failure father   ALLERGIES:  has No Known Allergies.  MEDICATIONS:  Current Outpatient Medications  Medication Sig Dispense Refill  . alendronate (FOSAMAX) 70 MG tablet Take 70 mg by mouth once a week. Take with a full glass of water on an empty stomach.    Tracey Stevens atorvastatin (LIPITOR) 20 MG tablet Take 20 mg by mouth daily.    Tracey Stevens azelastine (ASTELIN) 0.1 % nasal spray Place 1 spray into both nostrils 2 (two) times daily. Use in each nostril as directed    . cholecalciferol (VITAMIN D) 1000 units tablet Take 4,000 Units by mouth daily.     . furosemide (LASIX) 20 MG tablet Take 20 mg by mouth 2 (two) times daily.    Tracey Stevens  gabapentin (NEURONTIN) 300 MG capsule Take 300 mg by mouth 2 (two) times daily.    Tracey Stevens lisinopril (PRINIVIL,ZESTRIL) 10 MG tablet Take 10 mg by mouth daily.    Tracey Stevens losartan (COZAAR) 25 MG tablet Take by mouth.    . meclizine (ANTIVERT) 25 MG tablet Take 25 mg by mouth 2 (two) times daily as needed for dizziness.     No current facility-administered medications for this visit.     REVIEW OF SYSTEMS:   A 10+ POINT REVIEW OF SYSTEMS WAS OBTAINED including neurology, dermatology, psychiatry, cardiac, respiratory, lymph, extremities, GI, GU, Musculoskeletal, constitutional, breasts, reproductive, HEENT.  All pertinent positives are noted in the HPI.  All others are negative.   PHYSICAL EXAMINATION: ECOG PERFORMANCE STATUS: 1 - Symptomatic but completely ambulatory  . Vitals:   01/10/19 1218  BP: 135/81  Pulse: 80  Resp: 18  Temp: 98.8 F (37.1 C)  SpO2: 97%   Filed Weights   01/10/19 1218  Weight: (!) 348 lb 6.4 oz (158 kg)   .Body mass index is 45.97 kg/m.  GENERAL:alert, in no acute distress and comfortable SKIN: no acute rashes, no significant lesions EYES: conjunctiva are pink and non-injected, sclera anicteric OROPHARYNX: MMM, no exudates, no oropharyngeal erythema or ulceration NECK: supple, no JVD LYMPH:  no palpable lymphadenopathy in the cervical, axillary or inguinal regions LUNGS: clear to auscultation b/l with normal respiratory effort HEART: regular rate & rhythm ABDOMEN:  normoactive bowel sounds , non tender, not distended. No palpable hepatosplenomegaly.  Extremity: 2+ swelling right, 1+ swelling left  PSYCH: alert & oriented x 3 with fluent speech NEURO: no focal motor/sensory deficits  LABORATORY DATA:  I have reviewed the data as listed  . CBC Latest Ref Rng & Units 01/10/2019 01/11/2018 07/13/2017  WBC 4.0 - 10.5 K/uL 4.8 5.4 3.8(L)  Hemoglobin 12.0 - 15.0 g/dL 12.5 12.7 13.2  Hematocrit 36.0 - 46.0 % 38.1 37.7 38.8  Platelets 150 - 400 K/uL 308 264 279    . CBC    Component Value Date/Time   WBC 4.8 01/10/2019 1049   RBC 4.14 01/10/2019 1049   HGB 12.5 01/10/2019 1049   HGB 12.7 01/11/2018 1304   HGB 13.1 01/05/2017 1047   HCT 38.1 01/10/2019 1049   HCT 38.9 01/05/2017 1047   PLT 308 01/10/2019 1049  PLT 264 01/11/2018 1304   PLT 279 01/05/2017 1047   MCV 92.0 01/10/2019 1049   MCV 91.1 01/05/2017 1047   MCH 30.2 01/10/2019 1049   MCHC 32.8 01/10/2019 1049   RDW 12.6 01/10/2019 1049   RDW 13.3 01/05/2017 1047   LYMPHSABS 1.9 01/10/2019 1049   LYMPHSABS 1.7 01/05/2017 1047   MONOABS 0.5 01/10/2019 1049   MONOABS 0.4 01/05/2017 1047   EOSABS 0.2 01/10/2019 1049   EOSABS 0.2 01/05/2017 1047   BASOSABS 0.0 01/10/2019 1049   BASOSABS 0.0 01/05/2017 1047    . CMP Latest Ref Rng & Units 01/10/2019 01/11/2018 07/13/2017  Glucose 70 - 99 mg/dL 114(H) 90 82  BUN 6 - 20 mg/dL 19 16 18   Creatinine 0.44 - 1.00 mg/dL 2.03(H) 1.56(H) 1.44(H)  Sodium 135 - 145 mmol/L 144 146(H) 144  Potassium 3.5 - 5.1 mmol/L 4.2 4.1 3.9  Chloride 98 - 111 mmol/L 106 106 107  CO2 22 - 32 mmol/L 30 31 29   Calcium 8.9 - 10.3 mg/dL 10.2 10.2 10.4  Total Protein 6.5 - 8.1 g/dL 7.1 7.2 7.1  Total Bilirubin 0.3 - 1.2 mg/dL 0.5 0.5 0.6  Alkaline Phos 38 - 126 U/L 72 61 49  AST 15 - 41 U/L 17 15 15   ALT 0 - 44 U/L 7 12 9    . Lab Results  Component Value Date   LDH 161 01/10/2019       CT Chest Abdomen Pelvis W Contrast6/04/2015 [facility] Result Impression  IMPRESSION: 1.No evidence of abnormal lymphadenopathy within the chest, abdomen, or pelvis.  2.Indeterminate 1.5 cm left adrenal gland nodule.  3.Severe remote compression deformity of T12 which may reflect remote history of lymphoma.  4.Hepatic cyst  5. Simple appearing bilateral renal cysts. Left-sided nephrolithiasis.  6.Small hiatal hernia.  Result Narrative  INDICATION: C83.30: Diffuse large B-cell lymphoma, unspecified site.  COMPARISON:None available.   TECHNIQUE:CT CHEST ABDOMEN PELVIS W CONTRAST -100 mL Isovue 370 were administered intravenously. Dose reduction was utilized (automated exposure control, mA or kV adjustment based on patient size, or iterative image reconstruction).  FINDINGS:   SUPPORT APPARATUS: N.A.   LUNGS/PLEURA: No focal airspace opacities/consolidations. No pneumothorax.  No abnormal pulmonary masses.  No pleural effusions.  HEART/MEDIASTINUM: Cardiac size is normal. No acute thoracic aortic abnormalities.  No hilar, mediastinal, or axillary lymphadenopathy.  SOLID ABDOMINAL VISCERA: Liver: Small hepatic cysts measures 1.8 cm.. Gallbladder: Contracted Pancreas: Unremarkable. Adrenal glands: Indeterminate left gland nodule measuring 1.3 x 1.5 cm. Normal right adrenal gland. Spleen: Unremarkable. Kidneys: Multiple nonobstructing calculi within the left lower pole renal calyx largest measuring 8 mm. No hydronephrosis or perinephric stranding bilaterally. There are multiple bilateral renal cysts larger of which are seen on the left measuring up to  6.5 cm in size. There are smaller right renal cysts largest 1.2 cm.  GI: No bowel obstruction. Small hiatal hernia. No focal bowel wall thickening or inflammatory changes. Normal appendix.   PERITONEAL CAVITY/RETROPERITONEUM: No free fluid. No pneumoperitoneum. No lymphadenopathy.  PELVIS: No acute abnormalities.  MUSCULOSKELETAL: No acute or destructive osseous processes.  MISC./CHRONIC FINDINGS: Atherosclerotic calcifications are present, particularly involving the visualized aorta. Chronic/degenerative changes at L5-S1. Remote severe compression deformity of T12.   RADIOGRAPHIC STUDIES: I have personally reviewed the radiological images as listed and agreed with the findings in the report. No results found.   MRI Brain 05/20/17 at [facility]: #Skull/marrow/soft tissues: No calvarial or skull base lesion. #Orbits:  Unremarkable. #Sinuses: Unremarkable. #Brain: Overall parenchymal volume appropriate  for age. No significant signal abnormality appreciated within the brain parenchyma. In particular, no mass, hemorrhage or infarction. No significant white matter disease. Mild expanded empty sella. No  midline shift, hydrocephalus or extra-axial collections. Flow is demonstrated within the major vessels at the base of the brain.  MRI Spine Lumbar 03/18/17 at [facility]:  1. Old compression fracture at T12 2. Severe left neuroforaminal stenosis and moderate to severe right neuroforaminal stenosis at L5-S1 due to disc bulge and facet arthritis  3. Otherwise mild degenerative changes  ASSESSMENT & PLAN:   58 y.o. African-American female with  #1 history of stage IV diffuse large B-cell lymphoma possibly double hit. Diagnosed October 2014. Patient was treated with R CHOP 6 cycles and CNS prophylaxis with intrathecal methotrexate 4 doses. She is now about 4 years out and has been in complete remission. She has no constitutional symptoms at this time or palpable lymphadenopathy or hepatosplenomegaly or other focal symptoms to suggest lymphoma recurrence at this time. Her last CT chest abdomen pelvis on 08/16/2015 were negative for evidence of lymphoma.  PLAN:  -Discussed pt labwork today, 01/10/19; all values are WNL except for Glucose at 114, Creatinine at 2.03, GFR Est AFR Am at 30. -Discussed 01/10/2019 LDH at 161 -Recommended that the pt receive both pneumonia vaccines every 5 years -The pt shows no clinical or lab progression/return of DLBCL at this time.  -No indication for further treatment at this time.  -Will continue seeing the pt once a year as she is 6 years out from treatment  -Unless any concerning findings on labs or patient has new concerning symptoms would hold off on additional scheduled imaging for DLBCL surveillance at this time. -She was previously counseled on concerning  symptoms to monitor for and call us with -Will see back in 1 year with labs  #2 left-sided nephrolithiasis with renal cysts-being followed by Festus Aloe with the [facility] urology. -kidney stone is stable, her urologist is considering Cystoscopy   #3 chronic back pains due to degenerative disc disease and compression fracture at T12 from [place] with neurosurgery Dr. Sherwood Gambler. -She is now under pain management. Her naproxen was recently changed to Meloxicam in 06/2017.  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  -Seen on 05/20/17 brain MRI  -Continue to be followed by neurologist.   FOLLOW UP: RTC with Dr Irene Limbo with labs in 12 months  The total time spent in the appt was 20 minutes and more than 50% was on counseling and direct patient cares.  All of the patient's questions were answered with apparent satisfaction. The patient knows to call the clinic with any problems, questions or concerns.   Sullivan Lone MD [place] AAHIVMS [facility] Hematology/Oncology Physician [facility]  (Office):       [phone] (Work cell):  [phone] (Fax):           [phone]  01/10/2019 1:15 PM  I, Yevette Edwards, am acting as a scribe for Dr. Sullivan Lone.   .I have reviewed the above documentation for accuracy and completeness, and I agree with the above. Brunetta Genera MD

## 2019-01-10 ENCOUNTER — Inpatient Hospital Stay (HOSPITAL_BASED_OUTPATIENT_CLINIC_OR_DEPARTMENT_OTHER): Payer: Medicare HMO | Admitting: Hematology

## 2019-01-10 ENCOUNTER — Other Ambulatory Visit: Payer: Self-pay | Admitting: Hematology

## 2019-01-10 ENCOUNTER — Inpatient Hospital Stay: Payer: Medicare HMO | Attending: Hematology

## 2019-01-10 ENCOUNTER — Other Ambulatory Visit: Payer: Self-pay

## 2019-01-10 ENCOUNTER — Telehealth: Payer: Self-pay | Admitting: Hematology

## 2019-01-10 VITALS — BP 135/81 | HR 80 | Temp 98.8°F | Resp 18 | Ht 73.0 in | Wt 348.4 lb

## 2019-01-10 DIAGNOSIS — Z8541 Personal history of malignant neoplasm of cervix uteri: Secondary | ICD-10-CM | POA: Insufficient documentation

## 2019-01-10 DIAGNOSIS — C8338 Diffuse large B-cell lymphoma, lymph nodes of multiple sites: Secondary | ICD-10-CM

## 2019-01-10 DIAGNOSIS — Z8572 Personal history of non-Hodgkin lymphomas: Secondary | ICD-10-CM | POA: Diagnosis present

## 2019-01-10 LAB — CBC WITH DIFFERENTIAL/PLATELET
Abs Immature Granulocytes: 0.01 10*3/uL (ref 0.00–0.07)
Basophils Absolute: 0 10*3/uL (ref 0.0–0.1)
Basophils Relative: 0 %
Eosinophils Absolute: 0.2 10*3/uL (ref 0.0–0.5)
Eosinophils Relative: 5 %
HCT: 38.1 % (ref 36.0–46.0)
Hemoglobin: 12.5 g/dL (ref 12.0–15.0)
Immature Granulocytes: 0 %
Lymphocytes Relative: 40 %
Lymphs Abs: 1.9 10*3/uL (ref 0.7–4.0)
MCH: 30.2 pg (ref 26.0–34.0)
MCHC: 32.8 g/dL (ref 30.0–36.0)
MCV: 92 fL (ref 80.0–100.0)
Monocytes Absolute: 0.5 10*3/uL (ref 0.1–1.0)
Monocytes Relative: 10 %
Neutro Abs: 2.2 10*3/uL (ref 1.7–7.7)
Neutrophils Relative %: 45 %
Platelets: 308 10*3/uL (ref 150–400)
RBC: 4.14 MIL/uL (ref 3.87–5.11)
RDW: 12.6 % (ref 11.5–15.5)
WBC: 4.8 10*3/uL (ref 4.0–10.5)
nRBC: 0 % (ref 0.0–0.2)

## 2019-01-10 LAB — CMP (CANCER CENTER ONLY)
ALT: 7 U/L (ref 0–44)
AST: 17 U/L (ref 15–41)
Albumin: 3.5 g/dL (ref 3.5–5.0)
Alkaline Phosphatase: 72 U/L (ref 38–126)
Anion gap: 8 (ref 5–15)
BUN: 19 mg/dL (ref 6–20)
CO2: 30 mmol/L (ref 22–32)
Calcium: 10.2 mg/dL (ref 8.9–10.3)
Chloride: 106 mmol/L (ref 98–111)
Creatinine: 2.03 mg/dL — ABNORMAL HIGH (ref 0.44–1.00)
GFR, Est AFR Am: 30 mL/min — ABNORMAL LOW (ref >60)
GFR, Estimated: 26 mL/min — ABNORMAL LOW (ref >60)
Glucose, Bld: 114 mg/dL — ABNORMAL HIGH (ref 70–99)
Potassium: 4.2 mmol/L (ref 3.5–5.1)
Sodium: 144 mmol/L (ref 135–145)
Total Bilirubin: 0.5 mg/dL (ref 0.3–1.2)
Total Protein: 7.1 g/dL (ref 6.5–8.1)

## 2019-01-10 LAB — LACTATE DEHYDROGENASE: LDH: 161 U/L (ref 98–192)

## 2019-01-10 NOTE — Telephone Encounter (Signed)
Scheduled appt per 10/27 los.  Sent a staff message to get a calendar mailed out.

## 2019-03-23 DIAGNOSIS — G8929 Other chronic pain: Secondary | ICD-10-CM | POA: Insufficient documentation

## 2019-03-23 DIAGNOSIS — E785 Hyperlipidemia, unspecified: Secondary | ICD-10-CM | POA: Insufficient documentation

## 2019-03-23 DIAGNOSIS — M7062 Trochanteric bursitis, left hip: Secondary | ICD-10-CM | POA: Insufficient documentation

## 2019-06-29 DIAGNOSIS — N051 Unspecified nephritic syndrome with focal and segmental glomerular lesions: Secondary | ICD-10-CM | POA: Insufficient documentation

## 2019-07-11 ENCOUNTER — Other Ambulatory Visit: Payer: Self-pay | Admitting: Internal Medicine

## 2019-07-11 ENCOUNTER — Other Ambulatory Visit: Payer: Self-pay | Admitting: Family Medicine

## 2019-07-11 DIAGNOSIS — Z8639 Personal history of other endocrine, nutritional and metabolic disease: Secondary | ICD-10-CM

## 2019-07-11 DIAGNOSIS — M4850XD Collapsed vertebra, not elsewhere classified, site unspecified, subsequent encounter for fracture with routine healing: Secondary | ICD-10-CM

## 2019-07-11 DIAGNOSIS — M8580 Other specified disorders of bone density and structure, unspecified site: Secondary | ICD-10-CM

## 2019-07-11 DIAGNOSIS — M859 Disorder of bone density and structure, unspecified: Secondary | ICD-10-CM

## 2019-09-25 ENCOUNTER — Ambulatory Visit
Admission: RE | Admit: 2019-09-25 | Discharge: 2019-09-25 | Disposition: A | Discharge status: Home / Self Care | Payer: Medicare HMO | Source: Ambulatory Visit | Attending: Internal Medicine | Admitting: Internal Medicine

## 2019-09-25 ENCOUNTER — Other Ambulatory Visit: Payer: Self-pay

## 2019-09-25 DIAGNOSIS — M4850XD Collapsed vertebra, not elsewhere classified, site unspecified, subsequent encounter for fracture with routine healing: Secondary | ICD-10-CM

## 2019-09-25 DIAGNOSIS — M859 Disorder of bone density and structure, unspecified: Secondary | ICD-10-CM

## 2019-09-25 DIAGNOSIS — Z8639 Personal history of other endocrine, nutritional and metabolic disease: Secondary | ICD-10-CM

## 2019-10-05 ENCOUNTER — Other Ambulatory Visit: Payer: Self-pay | Admitting: Family Medicine

## 2019-10-05 DIAGNOSIS — Z1231 Encounter for screening mammogram for malignant neoplasm of breast: Secondary | ICD-10-CM

## 2019-11-10 ENCOUNTER — Ambulatory Visit
Admission: RE | Admit: 2019-11-10 | Discharge: 2019-11-10 | Disposition: A | Discharge status: Home / Self Care | Payer: Medicare HMO | Source: Ambulatory Visit | Attending: Family Medicine | Admitting: Family Medicine

## 2019-11-10 ENCOUNTER — Other Ambulatory Visit: Payer: Self-pay

## 2019-11-10 DIAGNOSIS — Z1231 Encounter for screening mammogram for malignant neoplasm of breast: Secondary | ICD-10-CM

## 2020-01-10 ENCOUNTER — Inpatient Hospital Stay: Payer: Medicare HMO | Admitting: Hematology

## 2020-01-10 ENCOUNTER — Inpatient Hospital Stay: Payer: Medicare HMO | Attending: Family Medicine

## 2020-01-11 ENCOUNTER — Telehealth: Payer: Self-pay | Admitting: Hematology

## 2020-01-11 NOTE — Telephone Encounter (Signed)
Called pt per 10/28 sch msg - no answer. Left message for patient to call back to reschedule appt.   

## 2020-01-23 ENCOUNTER — Other Ambulatory Visit: Payer: Medicare HMO

## 2020-01-23 ENCOUNTER — Ambulatory Visit: Payer: Medicare HMO | Admitting: Hematology

## 2020-02-05 ENCOUNTER — Other Ambulatory Visit: Payer: Self-pay | Admitting: *Deleted

## 2020-02-05 ENCOUNTER — Other Ambulatory Visit: Payer: Self-pay

## 2020-02-05 ENCOUNTER — Inpatient Hospital Stay: Payer: Medicare HMO | Attending: Family Medicine

## 2020-02-05 ENCOUNTER — Inpatient Hospital Stay (HOSPITAL_BASED_OUTPATIENT_CLINIC_OR_DEPARTMENT_OTHER): Payer: Medicare HMO | Admitting: Hematology

## 2020-02-05 VITALS — BP 152/92 | HR 77 | Temp 98.4°F | Resp 18 | Wt 328.1 lb

## 2020-02-05 DIAGNOSIS — C8338 Diffuse large B-cell lymphoma, lymph nodes of multiple sites: Secondary | ICD-10-CM

## 2020-02-05 LAB — CBC WITH DIFFERENTIAL/PLATELET
Abs Immature Granulocytes: 0.01 10*3/uL (ref 0.00–0.07)
Basophils Absolute: 0 10*3/uL (ref 0.0–0.1)
Basophils Relative: 0 %
Eosinophils Absolute: 0.2 10*3/uL (ref 0.0–0.5)
Eosinophils Relative: 3 %
HCT: 36.3 % (ref 36.0–46.0)
Hemoglobin: 12.1 g/dL (ref 12.0–15.0)
Immature Granulocytes: 0 %
Lymphocytes Relative: 41 %
Lymphs Abs: 2.2 10*3/uL (ref 0.7–4.0)
MCH: 30.9 pg (ref 26.0–34.0)
MCHC: 33.3 g/dL (ref 30.0–36.0)
MCV: 92.8 fL (ref 80.0–100.0)
Monocytes Absolute: 0.6 10*3/uL (ref 0.1–1.0)
Monocytes Relative: 11 %
Neutro Abs: 2.4 10*3/uL (ref 1.7–7.7)
Neutrophils Relative %: 45 %
Platelets: 292 10*3/uL (ref 150–400)
RBC: 3.91 MIL/uL (ref 3.87–5.11)
RDW: 12.6 % (ref 11.5–15.5)
WBC: 5.4 10*3/uL (ref 4.0–10.5)
nRBC: 0 % (ref 0.0–0.2)

## 2020-02-05 LAB — CMP (CANCER CENTER ONLY)
ALT: <6 U/L (ref 0–44)
AST: 8 U/L — ABNORMAL LOW (ref 15–41)
Albumin: 3.7 g/dL (ref 3.5–5.0)
Alkaline Phosphatase: 75 U/L (ref 38–126)
Anion gap: 7 (ref 5–15)
BUN: 21 mg/dL — ABNORMAL HIGH (ref 6–20)
CO2: 29 mmol/L (ref 22–32)
Calcium: 9.9 mg/dL (ref 8.9–10.3)
Chloride: 107 mmol/L (ref 98–111)
Creatinine: 2.56 mg/dL — ABNORMAL HIGH (ref 0.44–1.00)
GFR, Estimated: 21 mL/min — ABNORMAL LOW (ref >60)
Glucose, Bld: 95 mg/dL (ref 70–99)
Potassium: 4.4 mmol/L (ref 3.5–5.1)
Sodium: 143 mmol/L (ref 135–145)
Total Bilirubin: 0.4 mg/dL (ref 0.3–1.2)
Total Protein: 7.4 g/dL (ref 6.5–8.1)

## 2020-02-05 LAB — LACTATE DEHYDROGENASE: LDH: 177 U/L (ref 98–192)

## 2020-02-05 NOTE — Progress Notes (Signed)
Tracey Stevens Kitchen    HEMATOLOGY/ONCOLOGY CLINIC NOTE  Date of Service: 02/05/2020  Patient Care Team: Tracey Limbo, MD as PCP - General (Family Medicine)  CHIEF COMPLAINTS/PURPOSE OF CONSULTATION:  f/u for Diffuse large B-cell lymphoma.   HISTORY OF PRESENTING ILLNESS:   Tracey Stevens is a wonderful 59 y.o. female who has been referred to [place] by Dr .Tracey Limbo, MD  for evaluation and continued management of Diffuse large B-cell lymphoma.  Patient has a history of cervical cancer diagnosed in 2003 in [place] treated with total abdominal hysterectomy. Has been in remission since. He did not require any chemoradiation. Bilateral ovaries intact per patient.  She presented to Dr. Alferd Patee MD at [place] in October 2014 with left-sided abdominal and chest pain and was found to have abnormal liver function tests. Subsequent CT chest abdomen pelvis showed large hepatic lesions. Biopsy of one of the liver lesions in the right lobe of the liver on 12/05/2012 showed diffuse large B-cell lymphoma with a Ki-67 of more than 95%. Bone marrow biopsy was done on 12/29/2012 and was noted to be negative for lymphoma. Patient subsequently had a PET/CT scan which showed lymphoma involving the liver, spleen, mesenteric lymph nodes as well as a T12 fracture for which she was given a back brace.  Patient has been treated with 6 cycles of R CHOP for suspected double hit DL BCL and received 4 intrathecal doses of methotrexate for CNS prophylaxis. PET/CT scan done on 08/31/2013 showed no evidence of residual lymphoma. She was noted to have bilateral nodular densities in bilateral lower lungs which were thought to be due to inflammation/infection and she was empirically treated with levofloxacin for one week.  She has had issues with back pain intermittently and significant renal stones. She notes that she had her parathyroids removed for hyperparathyroidism.  She subsequently moved from [facility] to  [place] and was seen on 08/05/2015 by Dr. Marcine Stevens at the [facility] in [place] and had labs including a normal LDH of 135, normal CBC and normal chemistries  on 08/05/2015. She subsequently had a CT chest abdomen pelvis on 08/16/2015 that showed no evidence of abnormal lymphadenopathy within the chest abdomen or pelvis.  Noted to have an indeterminate 1.5 cm left adrenal gland nodule. Severe remote compression deformity of T12. She was noted to have left sided nephrolithiasis and simple appearing bilateral renal cysts for which she has been validated by Dr. Festus Stevens at [facility] urology.  Patient notes no fevers no chills no drenching night sweats. No weight loss. No other specific new focal symptoms at this time.  Notes that she has had issues with chronic back pains for which she has been using a brace. She has been validated by neurosurgery Dr. Sherwood Stevens and a decision has been made against surgery at this time. Has been using naproxen for her back pain.  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  INTERVAL HISTORY:  Ms. Tracey Stevens is here for her scheduled 12 month follow-up for diffuse large B-cell lymphoma. The patient's last visit with [place] was on 01/10/2019. The pt reports that she is doing well overall.  The pt reports that she has been dealing with a lot emotionally due to the recent passing of a close family member and the unexpected injury of another.   Pt had two kidney  biopsies with Dr. Olivia Stevens and was diagnosed with Focal Segmental Glomerulosclerosis after the second biopsy. She is on a no sodium diet, is hydrating well, and has restarted taking Lasix as prescribed. Pt has had two COVID19 vaccines and her annual flu vaccine. She is unsure if she is up-to-date with her Pneumonia vaccines.   Lab results today (02/05/20) of CBC w/diff  and CMP is as follows: all values are WNL except for BUN at 21, Creatinine at 2.56, AST at 8, GFR at 21. 02/05/2020 LDH at 177  On review of systems, pt reports stress, leg swelling, eating well, occasional lightheadedness and denies fevers, chills, night sweats, new lumps/bumps, unexpected weight loss, sleeplessness and any other symptoms.   MEDICAL HISTORY:   Past Medical History:  Diagnosis Date  . Cancer (HCC)    cervical  . Hypertension   . 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  Vertigo-likely due to either etiology Adrenal nodule Renal cysts Nephrolithiasis Hypertension T12 compression fracture Multinodular goiter Primary hyperparathyroidism status post parathyroidectomy Vitamin D deficiency Left-sided conductive hearing loss   SURGICAL HISTORY: Past Surgical History:  Procedure Laterality Date  . ABDOMINAL HYSTERECTOMY    . BREAST BIOPSY Right over 10 years ago   benign  Biopsy of liver lesion noted to be diffuse large B-cell lymphoma in 2014. Parathyroidectomy-subtotal History of left ear surgery History of chemotherapy  SOCIAL HISTORY: Social History   Socioeconomic History  . Marital status: Single    Spouse name: Not on file  . Number of children: 4  . Years of education: Not on file  . Highest education level: 12th grade  Occupational History  . Occupation: disabled  Tobacco Use  . Smoking status: Never Smoker  . Smokeless tobacco: Never Used  Vaping Use  . Vaping Use: Never used  Substance and Sexual Activity  . Alcohol use: No  . Drug use: No  . Sexual activity: Not on file  Other Topics Concern  . Not on file  Social History Narrative   Pt is right handed. She is single, lives alone in a 2nd floor apartment. She drinks 2-3 glasses of tea a day. She is active.    Social Determinants of Health   Financial Resource Strain:   . Difficulty of Paying Living Expenses: Not on file  Food Insecurity:   . Worried About Charity fundraiser in the Last Year: Not on file  . Ran Out of Food in the Last Year: Not on file  Transportation Needs:   . Lack of Transportation (Medical): Not on file  . Lack of Transportation (Non-Medical): Not on file  Physical Activity:   . Days of Exercise per Week: Not on file  . Minutes of Exercise per Session: Not on file  Stress:   . Feeling of Stress : Not on file  Social Connections:   . Frequency of Communication with Friends and Family: Not on file  . Frequency of Social Gatherings with Friends and Family: Not on file  . Attends Religious Services: Not on file  . Active Member of Clubs or Organizations: Not on file  . Attends Archivist Meetings: Not on file  . Marital Status: Not on file  Intimate Partner Violence:   . Fear of Current or Ex-Partner: Not on file  . Emotionally Abused: Not on file  .  Physically Abused: Not on file  . Sexually Abused: Not on file  Moved from [place] to [facility] about one year ago  FAMILY HISTORY:  Family history of coronary artery disease -mother  Hypertension-mother -Gastric cancer-father Renal failure father   ALLERGIES:  has No Known Allergies.  MEDICATIONS:  Current Outpatient Medications  Medication Sig Dispense Refill  . alendronate (FOSAMAX) 70 MG tablet Take 70 mg by mouth once a week. Take with a full glass of water on an empty stomach.    Tracey Stevens Kitchen atorvastatin (LIPITOR) 20 MG tablet Take 20 mg by mouth daily.    Tracey Stevens Kitchen azelastine (ASTELIN) 0.1 % nasal spray Place 1 spray into both nostrils 2 (two) times daily. Use in each nostril as directed    . cholecalciferol (VITAMIN D) 1000 units tablet Take 4,000 Units by mouth daily.     . furosemide (LASIX) 20 MG tablet Take 20 mg by mouth 2 (two) times daily.    Tracey Stevens Kitchen gabapentin (NEURONTIN) 300  MG capsule Take 300 mg by mouth 2 (two) times daily.    Tracey Stevens Kitchen lisinopril (PRINIVIL,ZESTRIL) 10 MG tablet Take 10 mg by mouth daily.    Tracey Stevens Kitchen losartan (COZAAR) 25 MG tablet Take by mouth.    . meclizine (ANTIVERT) 25 MG tablet Take 25 mg by mouth 2 (two) times daily as needed for dizziness.     No current facility-administered medications for this visit.    REVIEW OF SYSTEMS:   A 10+ POINT REVIEW OF SYSTEMS WAS OBTAINED including neurology, dermatology, psychiatry, cardiac, respiratory, lymph, extremities, GI, GU, Musculoskeletal, constitutional, breasts, reproductive, HEENT.  All pertinent positives are noted in the HPI.  All others are negative.   PHYSICAL EXAMINATION: ECOG PERFORMANCE STATUS: 1 - Symptomatic but completely ambulatory  . Vitals:   02/05/20 1331  BP: (!) 152/92  Pulse: 77  Resp: 18  Temp: 98.4 F (36.9 C)  SpO2: 99%   Filed Weights   02/05/20 1331  Weight: (!) 328 lb 1.6 oz (148.8 kg)   .Body mass index is 43.29 kg/m.  Exam was given in a chair   GENERAL:alert, in no acute distress and comfortable SKIN: no acute rashes, no significant lesions EYES: conjunctiva are pink and non-injected, sclera anicteric OROPHARYNX: MMM, no exudates, no oropharyngeal erythema or ulceration NECK: supple, no JVD LYMPH:  no palpable lymphadenopathy in the cervical, axillary or inguinal regions LUNGS: clear to auscultation b/l with normal respiratory effort HEART: regular rate & rhythm ABDOMEN:  normoactive bowel sounds , non tender, not distended. No palpable hepatosplenomegaly.  Extremity: 1+ right pedal edema, trace left pedal edema PSYCH: alert & oriented x 3 with fluent speech NEURO: no focal motor/sensory deficits  LABORATORY DATA:  I have reviewed the data as listed  . CBC Latest Ref Rng & Units 02/05/2020 01/10/2019 01/11/2018  WBC 4.0 - 10.5 K/uL 5.4 4.8 5.4  Hemoglobin 12.0 - 15.0 g/dL 12.1 12.5 12.7  Hematocrit 36 - 46 % 36.3 38.1 37.7  Platelets 150 - 400 K/uL 292  308 264   . CBC    Component Value Date/Time   WBC 5.4 02/05/2020 1305   RBC 3.91 02/05/2020 1305   HGB 12.1 02/05/2020 1305   HGB 12.7 01/11/2018 1304   HGB 13.1 01/05/2017 1047   HCT 36.3 02/05/2020 1305   HCT 38.9 01/05/2017 1047   PLT 292 02/05/2020 1305   PLT 264 01/11/2018 1304   PLT 279 01/05/2017 1047   MCV 92.8 02/05/2020 1305   MCV 91.1  01/05/2017 1047   MCH 30.9 02/05/2020 1305   MCHC 33.3 02/05/2020 1305   RDW 12.6 02/05/2020 1305   RDW 13.3 01/05/2017 1047   LYMPHSABS 2.2 02/05/2020 1305   LYMPHSABS 1.7 01/05/2017 1047   MONOABS 0.6 02/05/2020 1305   MONOABS 0.4 01/05/2017 1047   EOSABS 0.2 02/05/2020 1305   EOSABS 0.2 01/05/2017 1047   BASOSABS 0.0 02/05/2020 1305   BASOSABS 0.0 01/05/2017 1047    . CMP Latest Ref Rng & Units 02/05/2020 01/10/2019 01/11/2018  Glucose 70 - 99 mg/dL 95 114(H) 90  BUN 6 - 20 mg/dL 21(H) 19 16  Creatinine 0.44 - 1.00 mg/dL 2.56(H) 2.03(H) 1.56(H)  Sodium 135 - 145 mmol/L 143 144 146(H)  Potassium 3.5 - 5.1 mmol/L 4.4 4.2 4.1  Chloride 98 - 111 mmol/L 107 106 106  CO2 22 - 32 mmol/L _0 Calcium 8.9 - 10.3 mg/dL 9.9 10.2 10.2  Total Protein 6.5 - 8.1 g/dL 7.4 7.1 7.2  Total Bilirubin 0.3 - 1.2 mg/dL 0.4 0.5 0.5  Alkaline Phos 38 - 126 U/L 75 72 61  AST 15 - 41 U/L 8(L) 17 15  ALT 0 - 44 U/L <_1 . Lab Results  Component Value Date   LDH 177 02/05/2020       CT Chest Abdomen Pelvis W Contrast6/04/2015 [facility] Result Impression  IMPRESSION: 1.No evidence of abnormal lymphadenopathy within the chest, abdomen, or pelvis.  2.Indeterminate 1.5 cm left adrenal gland nodule.  3.Severe remote compression deformity of T12 which may reflect remote history of lymphoma.  4.Hepatic cyst  5. Simple appearing bilateral renal cysts. Left-sided nephrolithiasis.  6.Small hiatal hernia.  Result Narrative  INDICATION: C83.30: Diffuse large B-cell lymphoma, unspecified site.    COMPARISON:None available.  TECHNIQUE:CT CHEST ABDOMEN PELVIS W CONTRAST -100 mL Isovue 370 were administered intravenously. Dose reduction was utilized (automated exposure control, mA or kV adjustment based on patient size, or iterative image reconstruction).  FINDINGS:   SUPPORT APPARATUS: N.A.   LUNGS/PLEURA: No focal airspace opacities/consolidations. No pneumothorax.  No abnormal pulmonary masses.  No pleural effusions.  HEART/MEDIASTINUM: Cardiac size is normal. No acute thoracic aortic abnormalities.  No hilar, mediastinal, or axillary lymphadenopathy.  SOLID ABDOMINAL VISCERA: Liver: Small hepatic cysts measures 1.8 cm.. Gallbladder: Contracted Pancreas: Unremarkable. Adrenal glands: Indeterminate left gland nodule measuring 1.3 x 1.5 cm. Normal right adrenal gland. Spleen: Unremarkable. Kidneys: Multiple nonobstructing calculi within the left lower pole renal calyx largest measuring 8 mm. No hydronephrosis or perinephric stranding bilaterally. There are multiple bilateral renal cysts larger of which are seen on the left measuring up to  6.5 cm in size. There are smaller right renal cysts largest 1.2 cm.  GI: No bowel obstruction. Small hiatal hernia. No focal bowel wall thickening or inflammatory changes. Normal appendix.   PERITONEAL CAVITY/RETROPERITONEUM: No free fluid. No pneumoperitoneum. No lymphadenopathy.  PELVIS: No acute abnormalities.  MUSCULOSKELETAL: No acute or destructive osseous processes.  MISC./CHRONIC FINDINGS: Atherosclerotic calcifications are present, particularly involving the visualized aorta. Chronic/degenerative changes at L5-S1. Remote severe compression deformity of T12.   RADIOGRAPHIC STUDIES: I have personally reviewed the radiological images as listed and agreed with the findings in the report. No results found.   MRI Brain 05/20/17 at [place]: #Skull/marrow/soft tissues: No calvarial or  skull base lesion. #Orbits: Unremarkable. #Sinuses: Unremarkable. #Brain: Overall parenchymal volume appropriate for age. No significant signal abnormality appreciated within the brain parenchyma. In particular, no mass, hemorrhage or infarction. No  significant white matter disease. Mild expanded empty sella. No  midline shift, hydrocephalus or extra-axial collections. Flow is demonstrated within the major vessels at the base of the brain.  MRI Spine Lumbar 03/18/17 at [place]:  1. Old compression fracture at T12 2. Severe left neuroforaminal stenosis and moderate to severe right neuroforaminal stenosis at L5-S1 due to disc bulge and facet arthritis  3. Otherwise mild degenerative changes  ASSESSMENT & PLAN:   60 y.o. African-American female with  #1 history of stage IV diffuse large B-cell lymphoma possibly double hit. Diagnosed October 2014. Patient was treated with R CHOP 6 cycles and CNS prophylaxis with intrathecal methotrexate 4 doses. She is now about 4 years out and has been in complete remission. She has no constitutional symptoms at this time or palpable lymphadenopathy or hepatosplenomegaly or other focal symptoms to suggest lymphoma recurrence at this time. Her last CT chest abdomen pelvis on 08/16/2015 were negative for evidence of lymphoma.  PLAN:  -Discussed pt labwork today, 02/05/20; blood counts are nml, blood chemistries are stable, LDH is WNL.  -No lab or clinical evidence of DLBCL recurrence at this time. Will continue watchful observation.  -Advised pt that the risk of lymphoma recurrence is the highest in the first two years - pt is well outside of this time frame.  -Discussed CDC guidelines regarding the [place] booster. Pt will seek the Moderna booster. -Recommend pt continue f/u with PCP and OBGYN for routine cervical cancer screenings.  -Recommend pt f/u with PCP to confirm that she is up-to-date with Pneumonia vaccines.  -No indication  for additional scheduled imaging for DLBCL surveillance at this time. -Will see back in 12 months with labs   #2 left-sided nephrolithiasis with renal cysts-being followed by Tracey Stevens with the [facility] urology. -kidney stone is stable, her urologist is considering Cystoscopy   #3 chronic back pains due to degenerative disc disease and compression fracture at T12 from lymphoma. -Follows with neurosurgery Dr. Sherwood Stevens. -She is now under pain management. Her naproxen was recently changed to Meloxicam in 06/2017.  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  -Seen on 05/20/17 brain MRI  -Continue to be followed by neurologist.   FOLLOW UP: RTC with Dr. Irene Stevens in 12 months with labs.   The total time spent in the appt was 20 minutes and more than 50% was on counseling and direct patient cares.  All of the patient's questions were answered with apparent satisfaction. The patient knows to call the clinic with any problems, questions or concerns.    Sullivan Lone MD [place] AAHIVMS [facility] Hematology/Oncology Physician [facility]  (Office):       [phone] (Work cell):  [phone] (Fax):           [phone]  02/05/2020 2:24 PM  I, Yevette Edwards, am acting as a scribe for Dr. Sullivan Lone.   .I have reviewed the above documentation for accuracy and completeness, and I agree with the above. Brunetta Genera MD

## 2020-08-30 ENCOUNTER — Encounter (HOSPITAL_BASED_OUTPATIENT_CLINIC_OR_DEPARTMENT_OTHER): Payer: Self-pay | Admitting: Emergency Medicine

## 2020-08-30 ENCOUNTER — Emergency Department (HOSPITAL_BASED_OUTPATIENT_CLINIC_OR_DEPARTMENT_OTHER): Payer: Medicare HMO

## 2020-08-30 ENCOUNTER — Other Ambulatory Visit: Payer: Self-pay

## 2020-08-30 ENCOUNTER — Encounter: Payer: Self-pay | Admitting: Emergency Medicine

## 2020-08-30 ENCOUNTER — Emergency Department (HOSPITAL_BASED_OUTPATIENT_CLINIC_OR_DEPARTMENT_OTHER)
Admission: EM | Admit: 2020-08-30 | Discharge: 2020-08-30 | Disposition: A | Discharge status: Home / Self Care | Payer: Medicare HMO | Attending: Emergency Medicine | Admitting: Emergency Medicine

## 2020-08-30 DIAGNOSIS — N183 Chronic kidney disease, stage 3 unspecified: Secondary | ICD-10-CM | POA: Insufficient documentation

## 2020-08-30 DIAGNOSIS — I129 Hypertensive chronic kidney disease with stage 1 through stage 4 chronic kidney disease, or unspecified chronic kidney disease: Secondary | ICD-10-CM | POA: Insufficient documentation

## 2020-08-30 DIAGNOSIS — E1122 Type 2 diabetes mellitus with diabetic chronic kidney disease: Secondary | ICD-10-CM | POA: Diagnosis not present

## 2020-08-30 DIAGNOSIS — E119 Type 2 diabetes mellitus without complications: Secondary | ICD-10-CM | POA: Insufficient documentation

## 2020-08-30 DIAGNOSIS — K5792 Diverticulitis of intestine, part unspecified, without perforation or abscess without bleeding: Secondary | ICD-10-CM | POA: Insufficient documentation

## 2020-08-30 DIAGNOSIS — N200 Calculus of kidney: Secondary | ICD-10-CM | POA: Diagnosis not present

## 2020-08-30 DIAGNOSIS — R1032 Left lower quadrant pain: Secondary | ICD-10-CM | POA: Diagnosis present

## 2020-08-30 DIAGNOSIS — R109 Unspecified abdominal pain: Secondary | ICD-10-CM

## 2020-08-30 DIAGNOSIS — Z79899 Other long term (current) drug therapy: Secondary | ICD-10-CM | POA: Diagnosis not present

## 2020-08-30 LAB — CBC WITH DIFFERENTIAL/PLATELET
Abs Immature Granulocytes: 0 10*3/uL (ref 0.00–0.07)
Basophils Absolute: 0 10*3/uL (ref 0.0–0.1)
Basophils Relative: 0 %
Eosinophils Absolute: 0 10*3/uL (ref 0.0–0.5)
Eosinophils Relative: 0 %
HCT: 28.6 % — ABNORMAL LOW (ref 36.0–46.0)
Hemoglobin: 9.2 g/dL — ABNORMAL LOW (ref 12.0–15.0)
Lymphocytes Relative: 54 %
Lymphs Abs: 12.5 10*3/uL — ABNORMAL HIGH (ref 0.7–4.0)
MCH: 29.4 pg (ref 26.0–34.0)
MCHC: 32.2 g/dL (ref 30.0–36.0)
MCV: 91.4 fL (ref 80.0–100.0)
Monocytes Absolute: 0.5 10*3/uL (ref 0.1–1.0)
Monocytes Relative: 2 %
Neutro Abs: 3.7 10*3/uL (ref 1.7–7.7)
Neutrophils Relative %: 16 %
Other: 28 %
Platelets: 226 10*3/uL (ref 150–400)
RBC: 3.13 MIL/uL — ABNORMAL LOW (ref 3.87–5.11)
RDW: 17.1 % — ABNORMAL HIGH (ref 11.5–15.5)
WBC: 23.2 10*3/uL — ABNORMAL HIGH (ref 4.0–10.5)
nRBC: 1.1 % — ABNORMAL HIGH (ref 0.0–0.2)
nRBC: 2 /100 WBC — ABNORMAL HIGH

## 2020-08-30 LAB — COMPREHENSIVE METABOLIC PANEL
ALT: 10 U/L (ref 0–44)
AST: 16 U/L (ref 15–41)
Albumin: 3.8 g/dL (ref 3.5–5.0)
Alkaline Phosphatase: 51 U/L (ref 38–126)
Anion gap: 8 (ref 5–15)
BUN: 25 mg/dL — ABNORMAL HIGH (ref 8–23)
CO2: 28 mmol/L (ref 22–32)
Calcium: 10.1 mg/dL (ref 8.9–10.3)
Chloride: 109 mmol/L (ref 98–111)
Creatinine, Ser: 2.7 mg/dL — ABNORMAL HIGH (ref 0.44–1.00)
GFR, Estimated: 19 mL/min — ABNORMAL LOW (ref >60)
Glucose, Bld: 82 mg/dL (ref 70–99)
Potassium: 3.9 mmol/L (ref 3.5–5.1)
Sodium: 145 mmol/L (ref 135–145)
Total Bilirubin: 0.5 mg/dL (ref 0.3–1.2)
Total Protein: 6.7 g/dL (ref 6.5–8.1)

## 2020-08-30 LAB — LIPASE, BLOOD: Lipase: 17 U/L (ref 11–51)

## 2020-08-30 MED ORDER — CEPHALEXIN 500 MG PO CAPS: 500.0000 mg | ORAL_CAPSULE | Freq: Two times a day (BID) | ORAL | 0 refills | Status: AC

## 2020-08-30 NOTE — ED Triage Notes (Signed)
Pt arrives from doctor's office with complaints of left side [place] /back pain. Pt has history of cancer, reportedly MD wants a CT to assess.

## 2020-08-30 NOTE — Discharge Instructions (Addendum)
Overall suspect you have a recent passed kidney stone.  Take antibiotic as prescribed.  Follow-up with your primary care doctor.

## 2020-08-30 NOTE — ED Provider Notes (Signed)
[facility] EMERGENCY DEPT Provider Note   CSN: [id number] Arrival date & time: 08/30/20  1349     History Chief Complaint  Patient presents with   Abdominal Pain    Tracey Stevens is a 60 y.o. female.  Patient sent here from primary doctor's office for concern for diverticulitis.  Got a dose of antibiotics already at office send a prescription for Augmentin for presumed diverticulitis.  Patient had elevated white count to 21 and was sent here for CT scan.  Urinalysis was negative there.  Tracey Stevens has had some pain on and off.  History of lymphoma in remission.  Has some chronic kidney disease as well as has had abdominal hysterectomy in the past.  The history is provided by the patient.  Abdominal Pain Pain location:  LLQ Pain quality: aching   Pain radiates to:  L shoulder Pain severity:  Mild Onset quality:  Gradual Timing:  Constant Progression:  Unchanged Chronicity:  New Context: not previous surgeries   Relieved by:  Nothing Worsened by:  Nothing Associated symptoms: no chest pain, no chills, no cough, no diarrhea, no dysuria, no fever, no hematuria, no shortness of breath, no sore throat and no vomiting   Risk factors: has not had multiple surgeries       Past Medical History:  Diagnosis Date   Cancer ([place])    cervical   CKD (chronic kidney disease) stage 3, GFR 30-59 ml/min ([facility])    Diabetes mellitus without complication ([place])    Hypertension    Lymphoma in remission ([place]) 2015 remission   Renal disorder     Patient Active Problem List   Diagnosis Date Noted   Diabetes mellitus without complication ([place]) 69/62/9528   Lymphoma in remission [facility])     Past Surgical History:  Procedure Laterality Date   ABDOMINAL HYSTERECTOMY     BREAST BIOPSY Right over 10 years ago   benign     OB History   No obstetric history on file.     Family History  Problem Relation Age of Onset   Heart disease Mother    Hypertension Mother    Cancer Father     Renal Disease Father    Cancer Maternal Grandmother    Leukemia Maternal Grandfather    Cancer Paternal Grandmother    Cancer Paternal Grandfather     Social History   Tobacco Use   Smoking status: Never   Smokeless tobacco: Never  Vaping Use   Vaping Use: Never used  Substance Use Topics   Alcohol use: No   Drug use: No    Home Medications Prior to Admission medications   Medication Sig Start Date End Date Taking? Authorizing Provider  atorvastatin (LIPITOR) 20 MG tablet Take 20 mg by mouth daily.   Yes [provider]  cephALEXin (KEFLEX) 500 MG capsule Take 1 capsule (500 mg total) by mouth 2 (two) times daily for 5 days. 08/30/20 09/04/20 Yes Terita Hejl, DO  cholecalciferol (VITAMIN D) 1000 units tablet Take 4,000 Units by mouth daily.    Yes [provider]  furosemide (LASIX) 20 MG tablet Take 20 mg by mouth 2 (two) times daily.   Yes [provider]  gabapentin (NEURONTIN) 300 MG capsule Take 300 mg by mouth 2 (two) times daily.   Yes [provider]  losartan (COZAAR) 25 MG tablet Take 25 mg by mouth 2 (two) times daily.  11/09/17  Yes [provider]  meclizine (ANTIVERT) 25 MG tablet Take 25 mg  by mouth 2 (two) times daily as needed for dizziness.   Yes [provider]  alendronate (FOSAMAX) 70 MG tablet Take 70 mg by mouth once a week. Take with a full glass of water on an empty stomach.    [provider]  azelastine (ASTELIN) 0.1 % nasal spray Place 1 spray into both nostrils 2 (two) times daily. Use in each nostril as directed    [provider]  lisinopril (PRINIVIL,ZESTRIL) 10 MG tablet Take 10 mg by mouth daily. Patient not taking: No sig reported    [provider]    Allergies    Amlodipine  Review of Systems   Review of Systems  Constitutional:  Negative for chills and fever.  HENT:  Negative for ear pain and sore throat.   Eyes:  Negative for pain and visual disturbance.   Respiratory:  Negative for cough and shortness of breath.   Cardiovascular:  Negative for chest pain and palpitations.  Gastrointestinal:  Positive for abdominal pain. Negative for diarrhea and vomiting.  Genitourinary:  Negative for dysuria and hematuria.  Musculoskeletal:  Negative for arthralgias and back pain.  Skin:  Negative for color change and rash.  Neurological:  Negative for seizures and syncope.  All other systems reviewed and are negative.  Physical Exam Updated Vital Signs BP (!) 155/94   Pulse (!) 134   Temp 98.2 F (36.8 C) (Oral)   Resp 19   SpO2 98%   Physical Exam Vitals and nursing note reviewed.  Constitutional:      General: Tracey Stevens is not in acute distress.    Appearance: Tracey Stevens is well-developed. Tracey Stevens is not ill-appearing.  HENT:     Head: Normocephalic and atraumatic.     Mouth/Throat:     Mouth: Mucous membranes are moist.  Eyes:     Extraocular Movements: Extraocular movements intact.     Conjunctiva/sclera: Conjunctivae normal.     Pupils: Pupils are equal, round, and reactive to light.  Cardiovascular:     Rate and Rhythm: Normal rate and regular rhythm.     Heart sounds: Normal heart sounds. No murmur heard. Pulmonary:     Effort: Pulmonary effort is normal. No respiratory distress.     Breath sounds: Normal breath sounds.  Abdominal:     Palpations: Abdomen is soft.     Tenderness: There is abdominal tenderness in the left lower quadrant.  Genitourinary:    Rectum: Guaiac result negative.  Musculoskeletal:     Cervical back: Neck supple.  Skin:    General: Skin is warm and dry.     Capillary Refill: Capillary refill takes less than 2 seconds.  Neurological:     General: No focal deficit present.     Mental Status: Tracey Stevens is alert.    ED Results / Procedures / Treatments   Labs (all labs ordered are listed, but only abnormal results are displayed) Labs Reviewed  CBC WITH DIFFERENTIAL/PLATELET - Abnormal; Notable for the following  components:      Result Value   WBC 23.2 (*)    RBC 3.13 (*)    Hemoglobin 9.2 (*)    HCT 28.6 (*)    RDW 17.1 (*)    nRBC 1.1 (*)    Lymphs Abs 12.5 (*)    nRBC 2 (*)    All other components within normal limits  COMPREHENSIVE METABOLIC PANEL - Abnormal; Notable for the following components:   BUN 25 (*)    Creatinine, Ser 2.70 (*)  GFR, Estimated 19 (*)    All other components within normal limits  URINE CULTURE  LIPASE, BLOOD  POC OCCULT BLOOD, ED    EKG None  Radiology CT ABDOMEN PELVIS WO CONTRAST  Result Date: 08/30/2020 CLINICAL DATA:  Left lower quadrant abdominal pain. Leukocytosis. History of lymphoma. History of cervical cancer. EXAM: CT ABDOMEN AND PELVIS WITHOUT CONTRAST TECHNIQUE: Multidetector CT imaging of the abdomen and pelvis was performed following the standard protocol without IV contrast. COMPARISON:  10/17/2015, renal ultrasound 10/11/2019 FINDINGS: Lower chest: Lung bases are clear.  Heart size is mildly enlarged. Hepatobiliary: Unremarkable unenhanced appearance of the liver. No focal liver lesion identified. Gallbladder within normal limits. No hyperdense gallstone. No biliary dilatation. Pancreas: Unremarkable. No pancreatic ductal dilatation or surrounding inflammatory changes. Spleen: Normal in size without focal abnormality. Adrenals/Urinary Tract: Nodular configuration of the left adrenal gland is unchanged. Unremarkable right adrenal gland. 5.7 cm upper pole left renal cyst high density lesion within the interpolar region of the left kidney (series 2, image 25) measuring 1.0 cm which may reflect a hemorrhagic or proteinaceous cyst. 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 1.1 cm nonobstructing stone within the lower pole of the left kidney. No hydronephrosis. There are a few subcentimeter lesions within the right kidney, at least 1 of which likely represents a high-density hemorrhagic/proteinaceous cyst. No right-sided hydronephrosis. Urinary bladder is  within normal limits. Stomach/Bowel: Stomach is within normal limits. Appendix appears normal. No evidence of bowel wall thickening, distention, or inflammatory changes. Vascular/Lymphatic: 14 mm left external iliac chain lymph node (series 2, image 57) essentially stable compared to 2017 measured 13 mm. Multiple nonenlarged retroperitoneal lymph nodes within the abdomen are also unchanged. Numerous small lymph nodes throughout the mesentery with subtle hazy attenuation within the mesentery is unchanged. No acute vascular abnormality evident on non contrasted exam. Reproductive: Status post hysterectomy. No adnexal masses. Other: No free fluid. No abdominopelvic fluid collection. No pneumoperitoneum. Fat containing periumbilical hernia. Musculoskeletal: Nonspecific 9 mm area of lucency within the right iliac bone (series 2, image 56) is stable from 2017, benign. No new/acute osseous findings. Stable severe compression fracture of the T12 vertebral body. No suspicious bone lesion. IMPRESSION: 1. No acute abdominopelvic findings. 2. Nonobstructing 1.1 cm left renal stone. 3. Bilateral renal cysts including probable bilateral small hemorrhagic or proteinaceous cysts. Lesions or more fully characterized on previous renal ultrasound. 4. Stable 14 mm left external iliac chain lymph node. 5. Fat containing periumbilical hernia. 6. Stable severe compression fracture of the T12 vertebral body. Electronically Signed   By: Davina Poke D.O.   On: 08/30/2020 14:55    Procedures Procedures   Medications Ordered in ED Medications - No data to display  ED Course  I have reviewed the triage vital signs and the nursing notes.  Pertinent labs & imaging results that were available during my care of the patient were reviewed by me and considered in my medical decision making (see chart for details).    MDM Rules/Calculators/A&P                          Brendia Dampier is here with lower abdominal pain.  Was treated  with Rocephin at her primary care doctor's office for possible urine infection or diverticulitis.  White count of 21 in the office and a white count of 23 here.  Hemoglobin 9.2 but Hemoccult is negative and patient has brown stool on exam.  Tenderness mostly in the left  flank, left lower quadrant.  Urinalysis was overall unremarkable in the clinic.  Creatinine at baseline at 2.7.  Lipase normal doubt pancreatitis.  CT scan was obtained to evaluate for kidney stone, diverticulitis, bowel obstruction.  CT scan overall shows no acute findings.  There is a nonobstructive 1.1 cm left renal stone.  Otherwise CT scan is unremarkable.  Suspect may be a recently passed kidney stone as pain is much improved this afternoon.  We will treat her for possible UTI with Keflex.  Recommend follow-up with primary care doctor.  Discharged in good condition.  This chart was dictated using voice recognition software.  Despite best efforts to proofread,  errors can occur which can change the documentation meaning.   Final Clinical Impression(s) / ED Diagnoses Final diagnoses:  Abdominal pain, unspecified abdominal location    Rx / DC Orders ED Discharge Orders          Ordered    cephALEXin (KEFLEX) 500 MG capsule  2 times daily        08/30/20 [place], [place], DO 08/30/20 1518

## 2020-08-30 NOTE — ED Notes (Signed)
Patient transported to CT 

## 2020-09-01 LAB — URINE CULTURE

## 2020-09-10 ENCOUNTER — Telehealth: Payer: Self-pay | Admitting: Hematology

## 2020-09-10 ENCOUNTER — Other Ambulatory Visit: Payer: Self-pay | Admitting: Physician Assistant

## 2020-09-10 DIAGNOSIS — C8338 Diffuse large B-cell lymphoma, lymph nodes of multiple sites: Secondary | ICD-10-CM

## 2020-09-10 NOTE — Telephone Encounter (Signed)
Scheduled appt per 6/27 sch msg. Pt aware.  

## 2020-09-11 ENCOUNTER — Inpatient Hospital Stay: Payer: Medicare HMO

## 2020-09-11 ENCOUNTER — Encounter: Payer: Self-pay | Admitting: Physician Assistant

## 2020-09-11 ENCOUNTER — Telehealth: Payer: Self-pay | Admitting: *Deleted

## 2020-09-11 ENCOUNTER — Telehealth: Payer: Self-pay | Admitting: Physician Assistant

## 2020-09-11 ENCOUNTER — Inpatient Hospital Stay: Payer: Medicare HMO | Attending: Physician Assistant | Admitting: Physician Assistant

## 2020-09-11 ENCOUNTER — Other Ambulatory Visit: Payer: Self-pay

## 2020-09-11 VITALS — BP 143/94 | HR 106 | Temp 99.8°F | Resp 21 | Ht 73.0 in | Wt 332.7 lb

## 2020-09-11 DIAGNOSIS — R109 Unspecified abdominal pain: Secondary | ICD-10-CM | POA: Insufficient documentation

## 2020-09-11 DIAGNOSIS — C8338 Diffuse large B-cell lymphoma, lymph nodes of multiple sites: Secondary | ICD-10-CM

## 2020-09-11 DIAGNOSIS — Z8541 Personal history of malignant neoplasm of cervix uteri: Secondary | ICD-10-CM | POA: Insufficient documentation

## 2020-09-11 DIAGNOSIS — G8929 Other chronic pain: Secondary | ICD-10-CM | POA: Insufficient documentation

## 2020-09-11 DIAGNOSIS — N185 Chronic kidney disease, stage 5: Secondary | ICD-10-CM | POA: Insufficient documentation

## 2020-09-11 DIAGNOSIS — Z8572 Personal history of non-Hodgkin lymphomas: Secondary | ICD-10-CM | POA: Insufficient documentation

## 2020-09-11 DIAGNOSIS — D649 Anemia, unspecified: Secondary | ICD-10-CM

## 2020-09-11 DIAGNOSIS — D72829 Elevated white blood cell count, unspecified: Secondary | ICD-10-CM

## 2020-09-11 DIAGNOSIS — D72820 Lymphocytosis (symptomatic): Secondary | ICD-10-CM | POA: Insufficient documentation

## 2020-09-11 DIAGNOSIS — C8330 Diffuse large B-cell lymphoma, unspecified site: Secondary | ICD-10-CM | POA: Insufficient documentation

## 2020-09-11 DIAGNOSIS — R300 Dysuria: Secondary | ICD-10-CM

## 2020-09-11 LAB — CBC WITH DIFFERENTIAL (CANCER CENTER ONLY)
Abs Immature Granulocytes: 0 10*3/uL (ref 0.00–0.07)
Basophils Absolute: 0 10*3/uL (ref 0.0–0.1)
Basophils Relative: 0 %
Eosinophils Absolute: 0 10*3/uL (ref 0.0–0.5)
Eosinophils Relative: 0 %
HCT: 25.7 % — ABNORMAL LOW (ref 36.0–46.0)
Hemoglobin: 8.4 g/dL — ABNORMAL LOW (ref 12.0–15.0)
Lymphocytes Relative: 83 %
Lymphs Abs: 26.5 10*3/uL — ABNORMAL HIGH (ref 0.7–4.0)
MCH: 30.4 pg (ref 26.0–34.0)
MCHC: 32.7 g/dL (ref 30.0–36.0)
MCV: 93.1 fL (ref 80.0–100.0)
Monocytes Absolute: 0 10*3/uL — ABNORMAL LOW (ref 0.1–1.0)
Monocytes Relative: 0 %
Neutro Abs: 4.1 10*3/uL (ref 1.7–7.7)
Neutrophils Relative %: 13 %
Other: 4 %
Platelet Count: 218 10*3/uL (ref 150–400)
RBC: 2.76 MIL/uL — ABNORMAL LOW (ref 3.87–5.11)
RDW: 18.3 % — ABNORMAL HIGH (ref 11.5–15.5)
WBC Count: 31.9 10*3/uL — ABNORMAL HIGH (ref 4.0–10.5)
nRBC: 1.2 % — ABNORMAL HIGH (ref 0.0–0.2)

## 2020-09-11 LAB — CMP (CANCER CENTER ONLY)
ALT: <6 U/L (ref 0–44)
AST: 14 U/L — ABNORMAL LOW (ref 15–41)
Albumin: 3.5 g/dL (ref 3.5–5.0)
Alkaline Phosphatase: 70 U/L (ref 38–126)
Anion gap: 10 (ref 5–15)
BUN: 33 mg/dL — ABNORMAL HIGH (ref 8–23)
CO2: 28 mmol/L (ref 22–32)
Calcium: 10 mg/dL (ref 8.9–10.3)
Chloride: 106 mmol/L (ref 98–111)
Creatinine: 3.32 mg/dL (ref 0.44–1.00)
GFR, Estimated: 15 mL/min — ABNORMAL LOW (ref >60)
Glucose, Bld: 96 mg/dL (ref 70–99)
Potassium: 4.5 mmol/L (ref 3.5–5.1)
Sodium: 144 mmol/L (ref 135–145)
Total Bilirubin: 0.3 mg/dL (ref 0.3–1.2)
Total Protein: 7.2 g/dL (ref 6.5–8.1)

## 2020-09-11 LAB — URINALYSIS, COMPLETE (UACMP) WITH MICROSCOPIC
Bacteria, UA: NONE SEEN
Bilirubin Urine: NEGATIVE
Glucose, UA: NEGATIVE mg/dL
Hgb urine dipstick: NEGATIVE
Ketones, ur: NEGATIVE mg/dL
Leukocytes,Ua: NEGATIVE
Nitrite: NEGATIVE
Protein, ur: 30 mg/dL — AB
Specific Gravity, Urine: 1.008 (ref 1.005–1.030)
pH: 5 (ref 5.0–8.0)

## 2020-09-11 LAB — LACTATE DEHYDROGENASE: LDH: 443 U/L — ABNORMAL HIGH (ref 98–192)

## 2020-09-11 LAB — SAVE SMEAR(SSMR), FOR PROVIDER SLIDE REVIEW

## 2020-09-11 NOTE — Telephone Encounter (Signed)
I was called by [facility] notifying me that they have reserved a bed for Tracey Stevens in the inpatient leukemia service. Unfortunately, Tracey Stevens does not have transportation that can bring her to [facility]. She already made arrangements to come tomorrow at 10am to see Dr. Joan Mayans as originally planned. Patient is aware that since she is unable to come today, then she might have to wait for a bed after the appt.

## 2020-09-11 NOTE — Progress Notes (Signed)
Marland Kitchen    HEMATOLOGY/ONCOLOGY CLINIC NOTE  Date of Service: 09/11/2020  Patient Care Team: Bernerd Limbo, MD as PCP - General (Family Medicine)  DIAGNOSIS: Diffuse large B-cell lymphoma diagnosed in 2014 and treated with 6 cycles of R-CHOP and 4 intrathecal doses of methotrexate.  Cervical Cancer diagnosed in 2003 and treated with total abdominal hysterectomy.   ONCOLOGIC HISTORY: Initially presented to Dr. Alferd Patee MD at [facility] in October 2014 with left-sided abdominal and chest pain and was found to have abnormal liver function tests. Subsequent CT chest abdomen pelvis showed large hepatic lesions. Biopsy of one of the liver lesions in the right lobe of the liver on 12/05/2012 showed diffuse large B-cell lymphoma with a Ki-67 of more than 95%. Bone marrow biopsy was done on 12/29/2012 and was noted to be negative for lymphoma. PET/CT scan which showed lymphoma involving the liver, spleen, mesenteric lymph nodes as well as a T12 fracture for which she was given a back brace. Received treatment with 6 cycles of R CHOP for suspected double hit DL BCL and received 4 intrathecal doses of methotrexate for CNS prophylaxis. PET/CT scan done on 08/31/2013 showed no evidence of residual lymphoma. She was noted to have bilateral nodular densities in bilateral lower lungs which were thought to be due to inflammation/infection and she was empirically treated with levofloxacin for one week  INTERVAL HISTORY: Tracey Stevens returns to clinic for a follow up for history of DLBCL. Patient was last seen in clinic on 02/05/2020. In the interim, patient presented to the emergency room on 08/30/2020 for concern for diverticulitis. CT imaging was obtained that revealed no acute process. Labs were notable for WBC 23.3, Hgb 9.2, Plt 226K, Lymph # 12.5, Creatinine 2.70, BUN 25. Patient was given 10 day course of Augmentin. Patient had repeat labs at PCP on 09/05/2020 revealed WBC 26.0, Hgb 8.8,  Plt 278K. Peripheral smear revealed circulating blasts present, 48% and 1% Metamyelocytes.   On exam today, Tracey Stevens reports progressive fatigue. She reports that she is sitting on her recliner most of the day. She is able to get up to use the bathroom and prepare meals for herself. Patient reports intermittent episodes of nausea without any vomiting. She has intermittent episodes of diffuse abdominal pain. She describes the abdominal pain as mild and is not aware of any triggers. She has daily bowel movements without any constipation or diarrhea. She denies easy bruising or signs of bleeding. Patient has new shortness of breath, mainly with exertion but occasionally at rest. She notes a malodorous smell with urination but denies any burning with urination. Patient denies any fevers, chills, night sweats, chest pain or cough. She has no other complaints. Rest of the 10 point ROS is below.   MEDICAL HISTORY:   Past Medical History:  Diagnosis Date   Adrenal nodule ([place])    Cancer ([place])    cervical   CKD (chronic kidney disease) stage 3, GFR 30-59 ml/min (HCC)    Diabetes mellitus without complication ([place])    Diffuse Large B Cell Lymphoma 2015 remission   Hearing loss    Hyperparathyroidism ([place])    s/p parathyroidectomy   Hypertension    Hypertension    Multinodular goiter    Multiple renal cysts    Nephrolithiasis    Vertigo    Vitamin D deficiency      SURGICAL HISTORY: Past Surgical History:  Procedure Laterality Date   ABDOMINAL HYSTERECTOMY     BREAST BIOPSY Right  over 10 years ago   benign   SOCIAL HISTORY: Social History   Socioeconomic History   Marital status: Single    Spouse name: Not on file   Number of children: 4   Years of education: Not on file   Highest education level: 12th grade  Occupational History   Occupation: disabled  Tobacco Use   Smoking status: Never   Smokeless tobacco: Never  Vaping Use   Vaping Use: Never used  Substance and Sexual  Activity   Alcohol use: No   Drug use: No   Sexual activity: Not on file  Other Topics Concern   Not on file  Social History Narrative   Pt is right handed. She is single, lives alone in a 2nd floor apartment. She drinks 2-3 glasses of tea a day. She is active.   Social Determinants of Health   Financial Resource Strain: Not on file  Food Insecurity: Not on file  Transportation Needs: Not on file  Physical Activity: Not on file  Stress: Not on file  Social Connections: Not on file  Intimate Partner Violence: Not on file    FAMILY HISTORY: Family History  Problem Relation Age of Onset   Heart disease Mother    Hypertension Mother    Cancer Father    Renal Disease Father    Cancer Maternal Grandmother    Leukemia Maternal Grandfather    Cancer Paternal Grandmother    Cancer Paternal Grandfather       ALLERGIES:  is allergic to amlodipine.  MEDICATIONS:  Current Outpatient Medications  Medication Sig Dispense Refill   atorvastatin (LIPITOR) 20 MG tablet Take 20 mg by mouth daily.     cholecalciferol (VITAMIN D) 1000 units tablet Take 4,000 Units by mouth daily.      furosemide (LASIX) 20 MG tablet Take 20 mg by mouth 2 (two) times daily.     gabapentin (NEURONTIN) 300 MG capsule Take 300 mg by mouth 2 (two) times daily.     losartan (COZAAR) 25 MG tablet Take 25 mg by mouth 2 (two) times daily.      meclizine (ANTIVERT) 25 MG tablet Take 25 mg by mouth 2 (two) times daily as needed for dizziness.     methocarbamol (ROBAXIN) 500 MG tablet Take 500 mg by mouth daily as needed.     Metoprolol Succinate 25 MG CS24 Take 1 tablet by mouth daily.     No current facility-administered medications for this visit.    REVIEW OF SYSTEMS:   Review of Systems  Constitutional:  Positive for malaise/fatigue. Negative for chills and fever.  Respiratory:  Positive for shortness of breath. Negative for cough and wheezing.   Cardiovascular:  Negative for chest pain and leg swelling.   Gastrointestinal:  Positive for abdominal pain and nausea. Negative for blood in stool, constipation, diarrhea, melena and vomiting.  Skin:  Negative for rash.  Neurological:  Negative for dizziness.  Endo/Heme/Allergies:  Does not bruise/bleed easily.    PHYSICAL EXAMINATION: ECOG PERFORMANCE STATUS: 3 - Symptomatic, >50% confined to bed  . Vitals:   09/11/20 1453  BP: (!) 143/94  Pulse: (!) 106  Resp: (!) 21  Temp: 99.8 F (37.7 C)  SpO2: 98%   Filed Weights   09/11/20 1453  Weight: (!) 332 lb 11.2 oz (150.9 kg)   .Body mass index is 43.89 kg/m.   GENERAL:alert, in no acute distress and comfortable SKIN: no acute rashes, no significant lesions EYES: conjunctiva are pink and non-injected, sclera  anicteric OROPHARYNX: MMM, no exudates, no oropharyngeal erythema or ulceration NECK: supple, no JVD LYMPH:  no palpable lymphadenopathy in the cervical, axillary or inguinal regions LUNGS: clear to auscultation b/l with normal respiratory effort HEART: regular rate & rhythm ABDOMEN:  normoactive bowel sounds , non tender, not distended. No palpable hepatosplenomegaly.  Extremity: 1+ right pedal edema, trace left pedal edema PSYCH: alert & oriented x 3 with fluent speech NEURO: no focal motor/sensory deficits  LABORATORY DATA:  I have reviewed the data as listed  . CBC Latest Ref Rng & Units 09/11/2020 08/30/2020 02/05/2020  WBC 4.0 - 10.5 K/uL 31.9(H) 23.2(H) 5.4  Hemoglobin 12.0 - 15.0 g/dL 8.4(L) 9.2(L) 12.1  Hematocrit 36.0 - 46.0 % 25.7(L) 28.6(L) 36.3  Platelets 150 - 400 K/uL 218 226 292   . CBC    Component Value Date/Time   WBC 31.9 (H) 09/11/2020 1609   WBC 23.2 (H) 08/30/2020 1410   RBC 2.76 (L) 09/11/2020 1609   HGB 8.4 (L) 09/11/2020 1609   HGB 13.1 01/05/2017 1047   HCT 25.7 (L) 09/11/2020 1609   HCT 38.9 01/05/2017 1047   PLT 218 09/11/2020 1609   PLT 279 01/05/2017 1047   MCV 93.1 09/11/2020 1609   MCV 91.1 01/05/2017 1047   MCH 30.4 09/11/2020  1609   MCHC 32.7 09/11/2020 1609   RDW 18.3 (H) 09/11/2020 1609   RDW 13.3 01/05/2017 1047   LYMPHSABS 26.5 (H) 09/11/2020 1609   LYMPHSABS 1.7 01/05/2017 1047   MONOABS 0.0 (L) 09/11/2020 1609   MONOABS 0.4 01/05/2017 1047   EOSABS 0.0 09/11/2020 1609   EOSABS 0.2 01/05/2017 1047   BASOSABS 0.0 09/11/2020 1609   BASOSABS 0.0 01/05/2017 1047    . CMP Latest Ref Rng & Units 09/11/2020 08/30/2020 02/05/2020  Glucose 70 - 99 mg/dL 96 82 95  BUN 8 - 23 mg/dL 33(H) 25(H) 21(H)  Creatinine 0.44 - 1.00 mg/dL 3.32(HH) 2.70(H) 2.56(H)  Sodium 135 - 145 mmol/L 144 145 143  Potassium 3.5 - 5.1 mmol/L 4.5 3.9 4.4  Chloride 98 - 111 mmol/L 106 109 107  CO2 22 - 32 mmol/L _0 Calcium 8.9 - 10.3 mg/dL 10.0 10.1 9.9  Total Protein 6.5 - 8.1 g/dL 7.2 6.7 7.4  Total Bilirubin 0.3 - 1.2 mg/dL 0.3 0.5 0.4  Alkaline Phos 38 - 126 U/L 70 51 75  AST 15 - 41 U/L 14(L) 16 8(L)  ALT 0 - 44 U/L <6 10 <6   . Lab Results  Component Value Date   LDH 443 (H) 09/11/2020    RADIOGRAPHIC STUDIES: I have personally reviewed the radiological images as listed and agreed with the findings in the report. CT ABDOMEN PELVIS WO CONTRAST  Result Date: 08/30/2020 CLINICAL DATA:  Left lower quadrant abdominal pain. Leukocytosis. History of lymphoma. History of cervical cancer. EXAM: CT ABDOMEN AND PELVIS WITHOUT CONTRAST TECHNIQUE: Multidetector CT imaging of the abdomen and pelvis was performed following the standard protocol without IV contrast. COMPARISON:  10/17/2015, renal ultrasound 10/11/2019 FINDINGS: Lower chest: Lung bases are clear.  Heart size is mildly enlarged. Hepatobiliary: Unremarkable unenhanced appearance of the liver. No focal liver lesion identified. Gallbladder within normal limits. No hyperdense gallstone. No biliary dilatation. Pancreas: Unremarkable. No pancreatic ductal dilatation or surrounding inflammatory changes. Spleen: Normal in size without focal abnormality. Adrenals/Urinary Tract:  Nodular configuration of the left adrenal gland is unchanged. Unremarkable right adrenal gland. 5.7 cm upper pole left renal cyst high density lesion within the interpolar region  of the left kidney (series 2, image 25) measuring 1.0 cm which may reflect a hemorrhagic or proteinaceous cyst. 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 1.1 cm nonobstructing stone within the lower pole of the left kidney. No hydronephrosis. There are a few subcentimeter lesions within the right kidney, at least 1 of which likely represents a high-density hemorrhagic/proteinaceous cyst. No right-sided hydronephrosis. Urinary bladder is within normal limits. Stomach/Bowel: Stomach is within normal limits. Appendix appears normal. No evidence of bowel wall thickening, distention, or inflammatory changes. Vascular/Lymphatic: 14 mm left external iliac chain lymph node (series 2, image 57) essentially stable compared to 2017 measured 13 mm. Multiple nonenlarged retroperitoneal lymph nodes within the abdomen are also unchanged. Numerous small lymph nodes throughout the mesentery with subtle hazy attenuation within the mesentery is unchanged. No acute vascular abnormality evident on non contrasted exam. Reproductive: Status post hysterectomy. No adnexal masses. Other: No free fluid. No abdominopelvic fluid collection. No pneumoperitoneum. Fat containing periumbilical hernia. Musculoskeletal: Nonspecific 9 mm area of lucency within the right iliac bone (series 2, image 56) is stable from 2017, benign. No new/acute osseous findings. Stable severe compression fracture of the T12 vertebral body. No suspicious bone lesion. IMPRESSION: 1. No acute abdominopelvic findings. 2. Nonobstructing 1.1 cm left renal stone. 3. Bilateral renal cysts including probable bilateral small hemorrhagic or proteinaceous cysts. Lesions or more fully characterized on previous renal ultrasound. 4. Stable 14 mm left external iliac chain lymph node. 5. Fat containing  periumbilical hernia. 6. Stable severe compression fracture of the T12 vertebral body. Electronically Signed   By: Davina Poke D.O.   On: 08/30/2020 14:55     ASSESSMENT & PLAN:  Tracey Stevens returns today, 09/11/2020, after recent ED visit on 08/30/2020 due to suspected diverticulitis. CT of the abdomen/pelvis was unremarkable for an acute process. Labs were notable for WBC 23.3, Hgb 9.2, Plt 226K, Lymph # 12.5, Creatinine 2.70, BUN 25. Patient was given 10 day course of Augmentin. Patient had repeat labs at PCP on 09/05/2020 revealed WBC 26.0, Hgb 8.8, Plt 278K. Peripheral smear revealed circulating blasts present, 48% and 1% Metamyelocytes.   Repeat labs today showed continued leukocytosis with WBC 31.9, lymphocyte predominance with Lymph #26.5. Hemoglobin is 8.4, Creatinine is 3.32, BUN is 33, LDH is 443. Save smear was reviewed by Dr. Lorenso Courier who confirmed circulating blasts.   I reached out to Bobette Mo PA-C from [facility] who reviewed findings with Dr. Joan Mayans. The recommendation is direct admission to leukemia service but there are no available beds at this time. She recommends for patient to be evaluated in clinic tomorrow at 10 am with Dr. Joan Mayans, who will directly admit afterwards. Plan was reviewed with Dr. Lorenso Courier, who is covering in the absence of Dr. Ramces Shomaker Limbo, who was in agreement.    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 -Patient was treated with R CHOP 6 cycles and CNS prophylaxis with intrathecal methotrexate 4 doses.    2)Foul smelling urine: -Repeat UA and urine culture today to rule out infectious process.   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  -Currently under the care of nephrologist, Dr. Thereasa Distance at [facility] creatinine level has increased to 3.32.   4) Left-sided nephrolithiasis with renal cysts: -Under the care of Dr. Festus Aloe with the [facility] urology. -  Kidney stone is stable, her urologist is considering Cystoscopy  5) Chronic back pain: -Secondary to degenerative disc disease and compression fracture at T12 from [place] with neurosurgery Dr. Sherwood Gambler. -She is now under pain management. Her naproxen was recently changed to Meloxicam in 06/2017.  -would avoid significant NSAID use with elevated creatinine  6)Cervical cancer: -Status post total abdominal hysterectomy -Followed by GYN  7) Pituitary change:  - with experiences of dizzy spells and change in vision.  -Seen on 05/20/17 brain MRI  -Continue to be followed by neurologist.    All of the patient's questions were answered with apparent satisfaction. The patient knows to call the clinic with any problems, questions or concerns.  I have spent a total of 65 minutes minutes of face-to-face and non-face-to-face time, preparing to see the patient, obtaining and/or reviewing separately obtained history, performing a medically appropriate examination, counseling and educating the patient, ordering tests, referring and communicating with other health care professionals, documenting clinical information in the electronic health record, and care coordination.    Lincoln Brigham PA-C Hematology and [place]

## 2020-09-11 NOTE — Telephone Encounter (Signed)
TCT patient. Spoke with her. Advised that Dede Query, PA and Dr. Lorenso Courier have reviewed her lab work and advise that she go to [facility] ED to be a direct admission as her blood work was quite abnormal and that the Hematology groupm were the best suited to evaluate her and treat her. Pt a bit upset but voiced understanding. She does not have a care nor any family. She states she will try to find a friend to drive her there or take a cab.  Advised to pack a bag as she may be there for a few days. Pt voiced under standing.

## 2020-09-12 LAB — SURGICAL PATHOLOGY

## 2020-09-12 LAB — FLOW CYTOMETRY

## 2020-09-13 LAB — URINE CULTURE

## 2020-10-09 DIAGNOSIS — R7309 Other abnormal glucose: Secondary | ICD-10-CM | POA: Insufficient documentation

## 2020-10-09 DIAGNOSIS — R609 Edema, unspecified: Secondary | ICD-10-CM | POA: Insufficient documentation

## 2020-10-10 DIAGNOSIS — R600 Localized edema: Secondary | ICD-10-CM | POA: Insufficient documentation

## 2020-10-18 ENCOUNTER — Other Ambulatory Visit: Payer: Self-pay | Admitting: Family Medicine

## 2020-10-18 DIAGNOSIS — Z1231 Encounter for screening mammogram for malignant neoplasm of breast: Secondary | ICD-10-CM

## 2020-12-10 ENCOUNTER — Ambulatory Visit: Payer: Medicare HMO

## 2020-12-11 ENCOUNTER — Emergency Department (HOSPITAL_COMMUNITY): Payer: Medicare HMO

## 2020-12-11 ENCOUNTER — Inpatient Hospital Stay (HOSPITAL_COMMUNITY)
Admission: EM | Admit: 2020-12-11 | Discharge: 2020-12-15 | DRG: 682 | Disposition: A | Discharge status: Other Acute Inpatient Hospital | Payer: Medicare HMO | Attending: Family Medicine | Admitting: Family Medicine

## 2020-12-11 ENCOUNTER — Other Ambulatory Visit: Payer: Self-pay

## 2020-12-11 ENCOUNTER — Encounter (HOSPITAL_COMMUNITY): Payer: Self-pay

## 2020-12-11 DIAGNOSIS — E1122 Type 2 diabetes mellitus with diabetic chronic kidney disease: Secondary | ICD-10-CM | POA: Diagnosis present

## 2020-12-11 DIAGNOSIS — E883 Tumor lysis syndrome: Secondary | ICD-10-CM | POA: Diagnosis not present

## 2020-12-11 DIAGNOSIS — H9190 Unspecified hearing loss, unspecified ear: Secondary | ICD-10-CM | POA: Diagnosis present

## 2020-12-11 DIAGNOSIS — R6510 Systemic inflammatory response syndrome (SIRS) of non-infectious origin without acute organ dysfunction: Secondary | ICD-10-CM | POA: Diagnosis not present

## 2020-12-11 DIAGNOSIS — Z806 Family history of leukemia: Secondary | ICD-10-CM

## 2020-12-11 DIAGNOSIS — E875 Hyperkalemia: Secondary | ICD-10-CM | POA: Diagnosis not present

## 2020-12-11 DIAGNOSIS — N179 Acute kidney failure, unspecified: Secondary | ICD-10-CM | POA: Diagnosis present

## 2020-12-11 DIAGNOSIS — T451X5A Adverse effect of antineoplastic and immunosuppressive drugs, initial encounter: Secondary | ICD-10-CM | POA: Diagnosis present

## 2020-12-11 DIAGNOSIS — D61810 Antineoplastic chemotherapy induced pancytopenia: Secondary | ICD-10-CM | POA: Diagnosis present

## 2020-12-11 DIAGNOSIS — Z8249 Family history of ischemic heart disease and other diseases of the circulatory system: Secondary | ICD-10-CM

## 2020-12-11 DIAGNOSIS — E785 Hyperlipidemia, unspecified: Secondary | ICD-10-CM | POA: Diagnosis present

## 2020-12-11 DIAGNOSIS — Z20822 Contact with and (suspected) exposure to covid-19: Secondary | ICD-10-CM | POA: Diagnosis present

## 2020-12-11 DIAGNOSIS — Z8572 Personal history of non-Hodgkin lymphomas: Secondary | ICD-10-CM

## 2020-12-11 DIAGNOSIS — Z8541 Personal history of malignant neoplasm of cervix uteri: Secondary | ICD-10-CM

## 2020-12-11 DIAGNOSIS — R509 Fever, unspecified: Secondary | ICD-10-CM

## 2020-12-11 DIAGNOSIS — N170 Acute kidney failure with tubular necrosis: Secondary | ICD-10-CM | POA: Diagnosis present

## 2020-12-11 DIAGNOSIS — Z841 Family history of disorders of kidney and ureter: Secondary | ICD-10-CM

## 2020-12-11 DIAGNOSIS — Z6841 Body Mass Index (BMI) 40.0 and over, adult: Secondary | ICD-10-CM

## 2020-12-11 DIAGNOSIS — Z800 Family history of malignant neoplasm of digestive organs: Secondary | ICD-10-CM

## 2020-12-11 DIAGNOSIS — D6959 Other secondary thrombocytopenia: Secondary | ICD-10-CM | POA: Diagnosis present

## 2020-12-11 DIAGNOSIS — K219 Gastro-esophageal reflux disease without esophagitis: Secondary | ICD-10-CM | POA: Diagnosis present

## 2020-12-11 DIAGNOSIS — I120 Hypertensive chronic kidney disease with stage 5 chronic kidney disease or end stage renal disease: Secondary | ICD-10-CM | POA: Diagnosis present

## 2020-12-11 DIAGNOSIS — Z79899 Other long term (current) drug therapy: Secondary | ICD-10-CM

## 2020-12-11 DIAGNOSIS — N185 Chronic kidney disease, stage 5: Secondary | ICD-10-CM | POA: Diagnosis present

## 2020-12-11 DIAGNOSIS — E559 Vitamin D deficiency, unspecified: Secondary | ICD-10-CM | POA: Diagnosis present

## 2020-12-11 DIAGNOSIS — C9100 Acute lymphoblastic leukemia not having achieved remission: Secondary | ICD-10-CM | POA: Diagnosis present

## 2020-12-11 LAB — COMPREHENSIVE METABOLIC PANEL
ALT: 12 U/L (ref 0–44)
AST: 21 U/L (ref 15–41)
Albumin: 2.5 g/dL — ABNORMAL LOW (ref 3.5–5.0)
Alkaline Phosphatase: 54 U/L (ref 38–126)
Anion gap: 10 (ref 5–15)
BUN: 31 mg/dL — ABNORMAL HIGH (ref 8–23)
CO2: 23 mmol/L (ref 22–32)
Calcium: 8.5 mg/dL — ABNORMAL LOW (ref 8.9–10.3)
Chloride: 105 mmol/L (ref 98–111)
Creatinine, Ser: 5.01 mg/dL — ABNORMAL HIGH (ref 0.44–1.00)
GFR, Estimated: 9 mL/min — ABNORMAL LOW (ref >60)
Glucose, Bld: 122 mg/dL — ABNORMAL HIGH (ref 70–99)
Potassium: 4.3 mmol/L (ref 3.5–5.1)
Sodium: 138 mmol/L (ref 135–145)
Total Bilirubin: 0.5 mg/dL (ref 0.3–1.2)
Total Protein: 5.5 g/dL — ABNORMAL LOW (ref 6.5–8.1)

## 2020-12-11 LAB — URINALYSIS, ROUTINE W REFLEX MICROSCOPIC
Bilirubin Urine: NEGATIVE
Glucose, UA: NEGATIVE mg/dL
Ketones, ur: NEGATIVE mg/dL
Leukocytes,Ua: NEGATIVE
Nitrite: NEGATIVE
Protein, ur: >300 mg/dL — AB
Specific Gravity, Urine: 1.015 (ref 1.005–1.030)
pH: 6 (ref 5.0–8.0)

## 2020-12-11 LAB — PROTIME-INR
INR: 1.1 (ref 0.8–1.2)
Prothrombin Time: 14.2 seconds (ref 11.4–15.2)

## 2020-12-11 LAB — CBC WITH DIFFERENTIAL/PLATELET
Abs Immature Granulocytes: 0.19 10*3/uL — ABNORMAL HIGH (ref 0.00–0.07)
Basophils Absolute: 0 10*3/uL (ref 0.0–0.1)
Basophils Relative: 0 %
Eosinophils Absolute: 0.1 10*3/uL (ref 0.0–0.5)
Eosinophils Relative: 3 %
HCT: 29.2 % — ABNORMAL LOW (ref 36.0–46.0)
Hemoglobin: 9 g/dL — ABNORMAL LOW (ref 12.0–15.0)
Immature Granulocytes: 4 %
Lymphocytes Relative: 29 %
Lymphs Abs: 1.3 10*3/uL (ref 0.7–4.0)
MCH: 30.4 pg (ref 26.0–34.0)
MCHC: 30.8 g/dL (ref 30.0–36.0)
MCV: 98.6 fL (ref 80.0–100.0)
Monocytes Absolute: 0.7 10*3/uL (ref 0.1–1.0)
Monocytes Relative: 15 %
Neutro Abs: 2.2 10*3/uL (ref 1.7–7.7)
Neutrophils Relative %: 49 %
Platelets: 126 10*3/uL — ABNORMAL LOW (ref 150–400)
RBC: 2.96 MIL/uL — ABNORMAL LOW (ref 3.87–5.11)
RDW: 16.5 % — ABNORMAL HIGH (ref 11.5–15.5)
WBC: 4.6 10*3/uL (ref 4.0–10.5)
nRBC: 2.2 % — ABNORMAL HIGH (ref 0.0–0.2)

## 2020-12-11 LAB — RESP PANEL BY RT-PCR (FLU A&B, COVID) ARPGX2
Influenza A by PCR: NEGATIVE
Influenza B by PCR: NEGATIVE
SARS Coronavirus 2 by RT PCR: NEGATIVE

## 2020-12-11 LAB — APTT: aPTT: 33 seconds (ref 24–36)

## 2020-12-11 LAB — TROPONIN I (HIGH SENSITIVITY): Troponin I (High Sensitivity): 68 ng/L — ABNORMAL HIGH (ref <18)

## 2020-12-11 LAB — BRAIN NATRIURETIC PEPTIDE: B Natriuretic Peptide: 162.7 pg/mL — ABNORMAL HIGH (ref 0.0–100.0)

## 2020-12-11 LAB — LACTIC ACID, PLASMA: Lactic Acid, Venous: 1.5 mmol/L (ref 0.5–1.9)

## 2020-12-11 MED ORDER — SODIUM CHLORIDE 0.9 % IV SOLN
2.0000 g | Freq: Once | INTRAVENOUS | Status: AC
  Administered 2020-12-11: 2 g via INTRAVENOUS
  Filled 2020-12-11: qty 2

## 2020-12-11 MED ORDER — LACTATED RINGERS IV SOLN: INTRAVENOUS | Status: AC

## 2020-12-11 MED ORDER — LACTATED RINGERS IV BOLUS (SEPSIS): 1000.0000 mL | Freq: Once | INTRAVENOUS | Status: DC

## 2020-12-11 MED ORDER — ACETAMINOPHEN 500 MG PO TABS
1000.0000 mg | ORAL_TABLET | Freq: Once | ORAL | Status: AC
  Administered 2020-12-11: 1000 mg via ORAL
  Filled 2020-12-11: qty 2

## 2020-12-11 NOTE — ED Notes (Signed)
Patient transported to CT 

## 2020-12-11 NOTE — ED Provider Notes (Signed)
[facility] DEPT Provider Note   CSN: [id number] Arrival date & time: 12/11/20  2028     History Chief Complaint  Patient presents with   Fever   Weakness    Tracey Stevens is a 60 y.o. female.  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  Patient reports for the last 2 days she has had intermittent fever the most recent 1 was this evening at 103.2 oral.  She has felt generalized body aches and pains, decreased appetite and just not felt good today.  She has ongoing shortness of breath which she is not sure is different than baseline but is worse on exertion.  She denies any chest pain, abdominal pain, nausea, vomiting or diarrhea.  She has had an ongoing cough for some time now and feels that it is gradually getting worse but she denies any sputum production.  Throughout her time receiving continuous chemoinfusion she has had intermittent fevers.  Patient was taken off Lasix several weeks ago but she denies any new or worsening swelling in her lower extremities.  It has been baseline.  The history is provided by the patient.  Fever Weakness Associated symptoms: fever       Past Medical History:  Diagnosis Date   Adrenal nodule ([place])    Cancer (HCC)    cervical   CKD (chronic kidney disease) stage 3, GFR 30-59 ml/min (HCC)    Diabetes mellitus without complication ([place])    Diffuse Large B Cell Lymphoma 2015 remission   Hearing loss    Hyperparathyroidism ([place])    s/p parathyroidectomy   Hypertension    Hypertension    Multinodular goiter    Multiple renal cysts    Nephrolithiasis    Vertigo    Vitamin D deficiency     Patient Active Problem List   Diagnosis Date Noted   Diffuse large B cell lymphoma ([place]) 09/11/2020   Lymphocytosis 09/11/2020   CKD (chronic kidney disease) stage  5, GFR less than 15 ml/min ([place]) 09/11/2020   Anemia 09/11/2020   Dysuria 09/11/2020   Diabetes mellitus without complication ([place]) 86/57/8469   Lymphoma in remission [facility])     Past Surgical History:  Procedure Laterality Date   ABDOMINAL HYSTERECTOMY     BREAST BIOPSY Right over 10 years ago   benign   Left ear surgery     LIVER BIOPSY  2014   confirmed to be DLBCL   PARATHYROIDECTOMY       OB History   No obstetric history on file.     Family History  Problem Relation Age of Onset   Heart disease Mother    Hypertension Mother    Gastric cancer Father    Renal Disease Father    Cancer Maternal Grandmother    Leukemia Maternal Grandfather    Cancer Paternal Grandmother    Cancer Paternal Grandfather     Social History   Tobacco Use   Smoking status: Never   Smokeless tobacco: Never  Vaping Use   Vaping Use: Never used  Substance Use Topics   Alcohol use: No   Drug use: No    Home Medications Prior to Admission medications   Medication Sig Start Date End Date Taking? Authorizing Provider  allopurinol (ZYLOPRIM) 300 MG tablet Take 300 mg by mouth daily. 11/06/20  Yes [provider]  atorvastatin (LIPITOR) 20 MG tablet Take 20 mg by mouth daily.   Yes [provider]  cholecalciferol (VITAMIN D) 1000 units tablet Take 4,000 Units by mouth daily.    Yes [provider]  gabapentin (NEURONTIN) 300 MG capsule Take 300 mg by mouth daily.   Yes [provider]  meclizine (ANTIVERT) 25 MG tablet Take 25 mg by mouth 2 (two) times daily as needed for dizziness.   Yes [provider]  methocarbamol (ROBAXIN) 500 MG tablet Take 500 mg by mouth daily as needed for muscle spasms. 06/05/20  Yes [provider]  Metoprolol Succinate 25 MG CS24 Take 25 tablets by mouth daily.   Yes [provider]  omeprazole (PRILOSEC) 40 MG capsule Take 40 mg by mouth daily. 10/18/20  Yes [provider]  ondansetron  (ZOFRAN) 8 MG tablet Take 8 mg by mouth every 8 (eight) hours as needed for vomiting or nausea. 10/04/20  Yes [provider]    Allergies    Amlodipine  Review of Systems   Review of Systems  Constitutional:  Positive for fever.  Neurological:  Positive for weakness.  All other systems reviewed and are negative.  Physical Exam Updated Vital Signs BP (!) 131/101   Pulse (!) 126   Temp (!) 101.9 F (38.8 C) (Oral)   Resp (!) 30   Ht 6\' 1"  (1.854 m)   Wt (!) 149.7 kg   SpO2 94%   BMI 43.54 kg/m   Physical Exam Vitals and nursing note reviewed.  Constitutional:      General: She is not in acute distress.    Appearance: She is well-developed. She is ill-appearing.  HENT:     Head: Normocephalic and atraumatic.  Eyes:     Pupils: Pupils are equal, round, and reactive to light.  Cardiovascular:     Rate and Rhythm: Regular rhythm. Tachycardia present.     Pulses: Normal pulses.     Heart sounds: Normal heart sounds. No murmur heard.   No friction rub.  Pulmonary:     Effort: Tachypnea present.     Breath sounds: Normal breath sounds. Decreased air movement present. No wheezing or rales.     Comments: Port is accessed with continuous infusion.  No erythema surrounding the port site.  Decreased breath sounds at the bases bilaterally Abdominal:     General: Bowel sounds are normal. There is no distension.     Palpations: Abdomen is soft.     Tenderness: There is no abdominal tenderness. There is no guarding or rebound.  Musculoskeletal:        General: No tenderness. Normal range of motion.     Cervical back: Normal range of motion and neck supple.     Right lower leg: Edema present.     Left lower leg: Edema present.     Comments: 1+ pitting edema bilateral ankles  Skin:    General: Skin is warm and dry.     Findings: No rash.  Neurological:     Mental Status: She is alert and oriented to person, place, and time. Mental status is at baseline.     Cranial  Nerves: No cranial nerve deficit.  Psychiatric:        Mood and Affect: Mood normal.        Behavior: Behavior normal.    ED Results / Procedures / Treatments   Labs (all labs ordered are listed, but only abnormal results are displayed) Labs Reviewed  COMPREHENSIVE METABOLIC  PANEL - Abnormal; Notable for the following components:      Result Value   Glucose, Bld 122 (*)    BUN 31 (*)    Creatinine, Ser 5.01 (*)    Calcium 8.5 (*)    Total Protein 5.5 (*)    Albumin 2.5 (*)    GFR, Estimated 9 (*)    All other components within normal limits  CBC WITH DIFFERENTIAL/PLATELET - Abnormal; Notable for the following components:   RBC 2.96 (*)    Hemoglobin 9.0 (*)    HCT 29.2 (*)    RDW 16.5 (*)    Platelets 126 (*)    nRBC 2.2 (*)    Abs Immature Granulocytes 0.19 (*)    All other components within normal limits  URINALYSIS, ROUTINE W REFLEX MICROSCOPIC - Abnormal; Notable for the following components:   Color, Urine YELLOW (*)    APPearance CLEAR (*)    Hgb urine dipstick MODERATE (*)    Protein, ur >300 (*)    Bacteria, UA RARE (*)    All other components within normal limits  BRAIN NATRIURETIC PEPTIDE - Abnormal; Notable for the following components:   B Natriuretic Peptide 162.7 (*)    All other components within normal limits  TROPONIN I (HIGH SENSITIVITY) - Abnormal; Notable for the following components:   Troponin I (High Sensitivity) 68 (*)    All other components within normal limits  RESP PANEL BY RT-PCR (FLU A&B, COVID) ARPGX2  CULTURE, BLOOD (ROUTINE X 2)  CULTURE, BLOOD (ROUTINE X 2)  URINE CULTURE  LACTIC ACID, PLASMA  PROTIME-INR  APTT  TROPONIN I (HIGH SENSITIVITY)    EKG EKG Interpretation  Date/Time:  Wednesday December 11 2020 21:07:35 EDT Ventricular Rate:  135 PR Interval:  125 QRS Duration: 118 QT Interval:  300 QTC Calculation: 450 R Axis:   -49 Text Interpretation: Sinus tachycardia Left anterior fascicular block Left ventricular  hypertrophy ST elevation, consider inferior injury Artifact in lead(s) I III aVR aVL No previous tracing Confirmed by Blanchie Dessert [phone]) on 12/11/2020 9:18:05 PM  Radiology DG Chest Port 1 View  Result Date: 12/11/2020 CLINICAL DATA:  Questionable sepsis.  History of ALL. EXAM: PORTABLE CHEST 1 VIEW COMPARISON:  CT abdomen pelvis 08/30/2020 urogram 10/17/2015, CT FINDINGS: Right chest wall Port-A-Cath with tip overlying the superior cavoatrial junction. The heart and mediastinal contours are within normal limits. Question thickened right paratracheal stripe. Redemonstration of a right base pulmonary nodules measuring approximately 1.2 cm. No focal consolidation. No pulmonary edema. No pleural effusion. No pneumothorax. No acute osseous abnormality. IMPRESSION: 1. Question thickened right paratracheal stripe. Consider CT chest with Intravenous contrast if clinically indicated. 2. A persistent pericentimeter right base pulmonary nodule better evaluated on CT abdomen pelvis 08/30/2020. 3. Otherwise no acute cardiopulmonary abnormality. Electronically Signed   By: Iven Finn M.D.   On: 12/11/2020 21:07    Procedures Procedures   Medications Ordered in ED Medications  lactated ringers infusion ( Intravenous New Bag/Given 12/11/20 2149)  ceFEPIme (MAXIPIME) 2 g in sodium chloride 0.9 % 100 mL IVPB (2 g Intravenous New Bag/Given 12/11/20 2233)  acetaminophen (TYLENOL) tablet 1,000 mg (1,000 mg Oral Given 12/11/20 2149)    ED Course  I have reviewed the triage vital signs and the nursing notes.  Pertinent labs & imaging results that were available during my care of the patient were reviewed by me and considered in my medical decision making (see chart for details).    MDM Rules/Calculators/A&P  Patient is a 60 year old female with ALL on continuous chemoinfusion who is presenting today due to fever both yesterday and today.  She reports feeling much worse today  when she called her doctors at [facility] they recommended she come here to be evaluated.  She has been having ongoing cough and shortness of breath for some time now but cannot say that it is any worse today.  She has no chest pain, abdominal pain, vomiting or diarrhea.  She is chronically ill-appearing but awake and alert and able to speak in full sentences.  However patient is tachycardic today at 140 bpm, hypertensive and febrile with a temperature of 100.3.  Sepsis order set was initiated.  At this time there is no clear source.  In addition to fever of unknown origin work-up also will ensure patient does not have increasing fluid overload.  EKG also to evaluate that she is in a sinus rhythm.  We will start LR at a rate but will avoid bolus given her history of CHF and has recently been off Lasix for several weeks.  10:59 PM EKG with sinus tachy.  Pt given tylenol.  Given dose of cefepime for unknown source and possible neurtropenic fever.  Labs pending.  CXR wth thickened right paratracheal stripe and recommended CT with contrast.    10:59 PM Urine without evidence of UTI, BNP mildly elevated in the 100s and troponins in the 60s.  CMP with a creatinine today of 5 from her most recent 1 of 4.5 earlier this week.  COVID is negative.  Patient's temperature peaked at 101.9.  Fever is now improving with Tylenol and tachycardia is improving.  Will discuss with her oncologist at [facility].   11:23 PM Spoke with Dr. Berna Spare at [facility] and he reports at this time she needs work up for worsening renal failure and fever.  He does not feel that the fever is coming from the infusion. Pt is on the waiting list at [facility] but recommended admission here until bed available.  Spoke with hospitalist who recommends speaking with oncology here.  MDM   Amount and/or Complexity of Data Reviewed Clinical lab tests: ordered and reviewed Tests in the radiology section of CPT: ordered and reviewed Tests in the  medicine section of CPT: ordered and reviewed Decide to obtain previous medical records or to obtain history from someone other than the patient: yes Review and summarize past medical records: yes Discuss the patient with other providers: yes Independent visualization of images, tracings, or specimens: yes  Patient Progress Patient progress: stable  CRITICAL CARE Performed by: Mackey Varricchio Total critical care time: 45 minutes Critical care time was exclusive of separately billable procedures and treating other patients. Critical care was necessary to treat or prevent imminent or life-threatening deterioration. Critical care was time spent personally by me on the following activities: development of treatment plan with patient and/or surrogate as well as nursing, discussions with consultants, evaluation of patient's response to treatment, examination of patient, obtaining history from patient or surrogate, ordering and performing treatments and interventions, ordering and review of laboratory studies, ordering and review of radiographic studies, pulse oximetry and re-evaluation of patient's condition. care  Final Clinical Impression(s) / ED Diagnoses Final diagnoses:  Fever, unspecified fever cause  AKI (acute kidney injury) [facility])    Rx / DC Orders ED Discharge Orders     None        Blanchie Dessert, MD 12/11/20 2334

## 2020-12-11 NOTE — Sepsis Progress Note (Signed)
Elink following Code Sepsis. 

## 2020-12-11 NOTE — ED Notes (Signed)
[facility] for consult and transfer@22 :57pm.

## 2020-12-11 NOTE — ED Triage Notes (Addendum)
Pt reports fever, weakness, and increasing SHOB for the past few days. Pt has hx of ALL and receives continuous chemo. Denies any pain or any other symptoms. She reports temp of 103.4 earlier today.

## 2020-12-11 NOTE — ED Provider Notes (Signed)
11:41 PM Assumed care from Dr. Maryan Rued, please see their note for full history, physical and decision making until this point. In brief this is a 60 y.o. year old female who presented to the ED tonight with Fever and Weakness     Here with SIRS of unknown origin. No obvious infectious source. D/w Dr. Jasper Loser with [facility] who recommends admission. Prefers her to be there however they don't have beds. If related to infusion then usually closer proximity to starting meds.   Discussed with Dr. Burr Medico who feels patient can be temporarily managed here. Dr. Alcario Drought to admit.   CRITICAL CARE Performed by: Merrily Pew Total critical care time: 35 minutes Critical care time was exclusive of separately billable procedures and treating other patients. Critical care was necessary to treat or prevent imminent or life-threatening deterioration. Critical care was time spent personally by me on the following activities: development of treatment plan with patient and/or surrogate as well as nursing, discussions with consultants, evaluation of patient's response to treatment, examination of patient, obtaining history from patient or surrogate, ordering and performing treatments and interventions, ordering and review of laboratory studies, ordering and review of radiographic studies, pulse oximetry and re-evaluation of patient's condition.   Labs, studies and imaging reviewed by myself and considered in medical decision making if ordered. Imaging interpreted by radiology.  Labs Reviewed  COMPREHENSIVE METABOLIC PANEL - Abnormal; Notable for the following components:      Result Value   Glucose, Bld 122 (*)    BUN 31 (*)    Creatinine, Ser 5.01 (*)    Calcium 8.5 (*)    Total Protein 5.5 (*)    Albumin 2.5 (*)    GFR, Estimated 9 (*)    All other components within normal limits  CBC WITH DIFFERENTIAL/PLATELET - Abnormal; Notable for the following components:   RBC 2.96 (*)    Hemoglobin 9.0 (*)    HCT 29.2  (*)    RDW 16.5 (*)    Platelets 126 (*)    nRBC 2.2 (*)    Abs Immature Granulocytes 0.19 (*)    All other components within normal limits  URINALYSIS, ROUTINE W REFLEX MICROSCOPIC - Abnormal; Notable for the following components:   Color, Urine YELLOW (*)    APPearance CLEAR (*)    Hgb urine dipstick MODERATE (*)    Protein, ur >300 (*)    Bacteria, UA RARE (*)    All other components within normal limits  BRAIN NATRIURETIC PEPTIDE - Abnormal; Notable for the following components:   B Natriuretic Peptide 162.7 (*)    All other components within normal limits  TROPONIN I (HIGH SENSITIVITY) - Abnormal; Notable for the following components:   Troponin I (High Sensitivity) 68 (*)    All other components within normal limits  RESP PANEL BY RT-PCR (FLU A&B, COVID) ARPGX2  CULTURE, BLOOD (ROUTINE X 2)  CULTURE, BLOOD (ROUTINE X 2)  URINE CULTURE  LACTIC ACID, PLASMA  PROTIME-INR  APTT  TROPONIN I (HIGH SENSITIVITY)    DG Chest Port 1 View  Final Result    CT Chest Wo Contrast    (Results Pending)    No follow-ups on file.    Bastien Strawser, Corene Cornea, MD 12/12/20 [phone]

## 2020-12-12 ENCOUNTER — Inpatient Hospital Stay (HOSPITAL_COMMUNITY): Payer: Medicare HMO

## 2020-12-12 DIAGNOSIS — D6959 Other secondary thrombocytopenia: Secondary | ICD-10-CM | POA: Diagnosis present

## 2020-12-12 DIAGNOSIS — R6510 Systemic inflammatory response syndrome (SIRS) of non-infectious origin without acute organ dysfunction: Secondary | ICD-10-CM

## 2020-12-12 DIAGNOSIS — Z800 Family history of malignant neoplasm of digestive organs: Secondary | ICD-10-CM | POA: Diagnosis not present

## 2020-12-12 DIAGNOSIS — E1122 Type 2 diabetes mellitus with diabetic chronic kidney disease: Secondary | ICD-10-CM | POA: Diagnosis present

## 2020-12-12 DIAGNOSIS — T451X5A Adverse effect of antineoplastic and immunosuppressive drugs, initial encounter: Secondary | ICD-10-CM | POA: Diagnosis present

## 2020-12-12 DIAGNOSIS — R509 Fever, unspecified: Secondary | ICD-10-CM

## 2020-12-12 DIAGNOSIS — Z6841 Body Mass Index (BMI) 40.0 and over, adult: Secondary | ICD-10-CM | POA: Diagnosis not present

## 2020-12-12 DIAGNOSIS — C9100 Acute lymphoblastic leukemia not having achieved remission: Secondary | ICD-10-CM | POA: Diagnosis present

## 2020-12-12 DIAGNOSIS — Z806 Family history of leukemia: Secondary | ICD-10-CM | POA: Diagnosis not present

## 2020-12-12 DIAGNOSIS — N189 Chronic kidney disease, unspecified: Secondary | ICD-10-CM | POA: Diagnosis not present

## 2020-12-12 DIAGNOSIS — H9190 Unspecified hearing loss, unspecified ear: Secondary | ICD-10-CM | POA: Diagnosis present

## 2020-12-12 DIAGNOSIS — Z841 Family history of disorders of kidney and ureter: Secondary | ICD-10-CM | POA: Diagnosis not present

## 2020-12-12 DIAGNOSIS — N185 Chronic kidney disease, stage 5: Secondary | ICD-10-CM | POA: Diagnosis present

## 2020-12-12 DIAGNOSIS — N179 Acute kidney failure, unspecified: Secondary | ICD-10-CM

## 2020-12-12 DIAGNOSIS — Z8541 Personal history of malignant neoplasm of cervix uteri: Secondary | ICD-10-CM | POA: Diagnosis not present

## 2020-12-12 DIAGNOSIS — I129 Hypertensive chronic kidney disease with stage 1 through stage 4 chronic kidney disease, or unspecified chronic kidney disease: Secondary | ICD-10-CM | POA: Diagnosis not present

## 2020-12-12 DIAGNOSIS — Z8249 Family history of ischemic heart disease and other diseases of the circulatory system: Secondary | ICD-10-CM | POA: Diagnosis not present

## 2020-12-12 DIAGNOSIS — I120 Hypertensive chronic kidney disease with stage 5 chronic kidney disease or end stage renal disease: Secondary | ICD-10-CM | POA: Diagnosis present

## 2020-12-12 DIAGNOSIS — E883 Tumor lysis syndrome: Secondary | ICD-10-CM | POA: Diagnosis present

## 2020-12-12 DIAGNOSIS — K219 Gastro-esophageal reflux disease without esophagitis: Secondary | ICD-10-CM | POA: Diagnosis present

## 2020-12-12 DIAGNOSIS — D61810 Antineoplastic chemotherapy induced pancytopenia: Secondary | ICD-10-CM | POA: Diagnosis present

## 2020-12-12 DIAGNOSIS — E875 Hyperkalemia: Secondary | ICD-10-CM | POA: Diagnosis not present

## 2020-12-12 DIAGNOSIS — Z8572 Personal history of non-Hodgkin lymphomas: Secondary | ICD-10-CM | POA: Diagnosis not present

## 2020-12-12 DIAGNOSIS — E785 Hyperlipidemia, unspecified: Secondary | ICD-10-CM | POA: Diagnosis present

## 2020-12-12 DIAGNOSIS — N170 Acute kidney failure with tubular necrosis: Secondary | ICD-10-CM | POA: Diagnosis present

## 2020-12-12 DIAGNOSIS — E559 Vitamin D deficiency, unspecified: Secondary | ICD-10-CM | POA: Diagnosis present

## 2020-12-12 DIAGNOSIS — Z20822 Contact with and (suspected) exposure to covid-19: Secondary | ICD-10-CM | POA: Diagnosis present

## 2020-12-12 DIAGNOSIS — N184 Chronic kidney disease, stage 4 (severe): Secondary | ICD-10-CM | POA: Diagnosis not present

## 2020-12-12 LAB — CBC
HCT: 29.5 % — ABNORMAL LOW (ref 36.0–46.0)
Hemoglobin: 9.2 g/dL — ABNORMAL LOW (ref 12.0–15.0)
MCH: 30.8 pg (ref 26.0–34.0)
MCHC: 31.2 g/dL (ref 30.0–36.0)
MCV: 98.7 fL (ref 80.0–100.0)
Platelets: 115 10*3/uL — ABNORMAL LOW (ref 150–400)
RBC: 2.99 MIL/uL — ABNORMAL LOW (ref 3.87–5.11)
RDW: 16.6 % — ABNORMAL HIGH (ref 11.5–15.5)
WBC: 4.1 10*3/uL (ref 4.0–10.5)
nRBC: 2.2 % — ABNORMAL HIGH (ref 0.0–0.2)

## 2020-12-12 LAB — COMPREHENSIVE METABOLIC PANEL
ALT: 11 U/L (ref 0–44)
AST: 21 U/L (ref 15–41)
Albumin: 2.4 g/dL — ABNORMAL LOW (ref 3.5–5.0)
Alkaline Phosphatase: 47 U/L (ref 38–126)
Anion gap: 8 (ref 5–15)
BUN: 33 mg/dL — ABNORMAL HIGH (ref 8–23)
CO2: 21 mmol/L — ABNORMAL LOW (ref 22–32)
Calcium: 8.7 mg/dL — ABNORMAL LOW (ref 8.9–10.3)
Chloride: 111 mmol/L (ref 98–111)
Creatinine, Ser: 5.06 mg/dL — ABNORMAL HIGH (ref 0.44–1.00)
GFR, Estimated: 9 mL/min — ABNORMAL LOW (ref >60)
Glucose, Bld: 123 mg/dL — ABNORMAL HIGH (ref 70–99)
Potassium: 4.8 mmol/L (ref 3.5–5.1)
Sodium: 140 mmol/L (ref 135–145)
Total Bilirubin: 0.7 mg/dL (ref 0.3–1.2)
Total Protein: 5.3 g/dL — ABNORMAL LOW (ref 6.5–8.1)

## 2020-12-12 LAB — LACTIC ACID, PLASMA
Lactic Acid, Venous: 0.8 mmol/L (ref 0.5–1.9)
Lactic Acid, Venous: 0.9 mmol/L (ref 0.5–1.9)

## 2020-12-12 LAB — CBG MONITORING, ED
Glucose-Capillary: 114 mg/dL — ABNORMAL HIGH (ref 70–99)
Glucose-Capillary: 142 mg/dL — ABNORMAL HIGH (ref 70–99)
Glucose-Capillary: 164 mg/dL — ABNORMAL HIGH (ref 70–99)

## 2020-12-12 LAB — TROPONIN I (HIGH SENSITIVITY): Troponin I (High Sensitivity): 71 ng/L — ABNORMAL HIGH (ref <18)

## 2020-12-12 LAB — GLUCOSE, CAPILLARY: Glucose-Capillary: 157 mg/dL — ABNORMAL HIGH (ref 70–99)

## 2020-12-12 LAB — URINE CULTURE: Culture: <10000 — AB

## 2020-12-12 LAB — PROTIME-INR
INR: 1.2 (ref 0.8–1.2)
Prothrombin Time: 14.9 seconds (ref 11.4–15.2)

## 2020-12-12 LAB — PROCALCITONIN
Procalcitonin: 2.75 ng/mL
Procalcitonin: 2.84 ng/mL

## 2020-12-12 LAB — LACTATE DEHYDROGENASE: LDH: 467 U/L — ABNORMAL HIGH (ref 98–192)

## 2020-12-12 LAB — CORTISOL-AM, BLOOD: Cortisol - AM: 10.5 ug/dL (ref 6.7–22.6)

## 2020-12-12 LAB — HIV ANTIBODY (ROUTINE TESTING W REFLEX): HIV Screen 4th Generation wRfx: NONREACTIVE

## 2020-12-12 LAB — URIC ACID: Uric Acid, Serum: 8.9 mg/dL — ABNORMAL HIGH (ref 2.5–7.1)

## 2020-12-12 MED ORDER — ONDANSETRON HCL 4 MG/2ML IJ SOLN: 4.0000 mg | Freq: Four times a day (QID) | INTRAMUSCULAR | Status: DC | PRN

## 2020-12-12 MED ORDER — VANCOMYCIN VARIABLE DOSE PER UNSTABLE RENAL FUNCTION (PHARMACIST DOSING): Status: DC

## 2020-12-12 MED ORDER — ACETAMINOPHEN 650 MG RE SUPP: 650.0000 mg | Freq: Four times a day (QID) | RECTAL | Status: DC | PRN

## 2020-12-12 MED ORDER — VANCOMYCIN HCL 2000 MG/400ML IV SOLN
2000.0000 mg | Freq: Once | INTRAVENOUS | Status: AC
  Administered 2020-12-12: 2000 mg via INTRAVENOUS
  Filled 2020-12-12: qty 400

## 2020-12-12 MED ORDER — ONDANSETRON HCL 4 MG PO TABS: 4.0000 mg | ORAL_TABLET | Freq: Four times a day (QID) | ORAL | Status: DC | PRN

## 2020-12-12 MED ORDER — SODIUM CHLORIDE 0.9% FLUSH
10.0000 mL | Freq: Two times a day (BID) | INTRAVENOUS | Status: DC
Start: 2020-12-12 — End: 2020-12-15
  Administered 2020-12-14 – 2020-12-15 (×3): 10 mL

## 2020-12-12 MED ORDER — DEXAMETHASONE SODIUM PHOSPHATE 10 MG/ML IJ SOLN
8.0000 mg | Freq: Three times a day (TID) | INTRAMUSCULAR | Status: DC
  Administered 2020-12-12 – 2020-12-14 (×8): 8 mg via INTRAVENOUS
  Filled 2020-12-12 (×8): qty 1

## 2020-12-12 MED ORDER — GABAPENTIN 300 MG PO CAPS
300.0000 mg | ORAL_CAPSULE | Freq: Every day | ORAL | Status: DC
  Administered 2020-12-12 – 2020-12-15 (×4): 300 mg via ORAL
  Filled 2020-12-12 (×4): qty 1

## 2020-12-12 MED ORDER — ENOXAPARIN SODIUM 30 MG/0.3ML IJ SOSY: 30.0000 mg | PREFILLED_SYRINGE | INTRAMUSCULAR | Status: DC

## 2020-12-12 MED ORDER — VANCOMYCIN HCL IN DEXTROSE 1-5 GM/200ML-% IV SOLN: 1000.0000 mg | Freq: Once | INTRAVENOUS | Status: DC

## 2020-12-12 MED ORDER — ALLOPURINOL 300 MG PO TABS
300.0000 mg | ORAL_TABLET | Freq: Every day | ORAL | Status: DC
  Administered 2020-12-12 – 2020-12-14 (×3): 300 mg via ORAL
  Filled 2020-12-12 (×3): qty 1

## 2020-12-12 MED ORDER — ATORVASTATIN CALCIUM 20 MG PO TABS
20.0000 mg | ORAL_TABLET | Freq: Every day | ORAL | Status: DC
  Administered 2020-12-12 – 2020-12-15 (×4): 20 mg via ORAL
  Filled 2020-12-12 (×2): qty 1
  Filled 2020-12-12: qty 2
  Filled 2020-12-12: qty 1

## 2020-12-12 MED ORDER — PANTOPRAZOLE SODIUM 40 MG PO TBEC
40.0000 mg | DELAYED_RELEASE_TABLET | Freq: Every day | ORAL | Status: DC
  Administered 2020-12-12 – 2020-12-15 (×4): 40 mg via ORAL
  Filled 2020-12-12 (×4): qty 1

## 2020-12-12 MED ORDER — HEPARIN SODIUM (PORCINE) 5000 UNIT/ML IJ SOLN
5000.0000 [IU] | Freq: Three times a day (TID) | INTRAMUSCULAR | Status: DC
  Administered 2020-12-12 – 2020-12-15 (×10): 5000 [IU] via SUBCUTANEOUS
  Filled 2020-12-12 (×10): qty 1

## 2020-12-12 MED ORDER — METRONIDAZOLE 500 MG/100ML IV SOLN
500.0000 mg | Freq: Two times a day (BID) | INTRAVENOUS | Status: DC
  Administered 2020-12-12 – 2020-12-15 (×7): 500 mg via INTRAVENOUS
  Filled 2020-12-12 (×7): qty 100

## 2020-12-12 MED ORDER — ACETAMINOPHEN 325 MG PO TABS
650.0000 mg | ORAL_TABLET | Freq: Four times a day (QID) | ORAL | Status: DC | PRN
  Administered 2020-12-12: 650 mg via ORAL
  Filled 2020-12-12: qty 2

## 2020-12-12 MED ORDER — CHLORHEXIDINE GLUCONATE CLOTH 2 % EX PADS: 6.0000 | MEDICATED_PAD | Freq: Every day | CUTANEOUS | Status: DC

## 2020-12-12 MED ORDER — CHLORHEXIDINE GLUCONATE CLOTH 2 % EX PADS
6.0000 | MEDICATED_PAD | Freq: Every day | CUTANEOUS | Status: DC
  Administered 2020-12-13 – 2020-12-15 (×4): 6 via TOPICAL

## 2020-12-12 MED ORDER — SODIUM CHLORIDE 0.9% FLUSH: 10.0000 mL | INTRAVENOUS | Status: DC | PRN

## 2020-12-12 MED ORDER — SODIUM CHLORIDE 0.9 % IV SOLN
2.0000 g | INTRAVENOUS | Status: DC
  Administered 2020-12-12 – 2020-12-14 (×3): 2 g via INTRAVENOUS
  Filled 2020-12-12 (×3): qty 2

## 2020-12-12 NOTE — H&P (Signed)
History and Physical    Tracey Stevens HBZ:[id number] DOB: 03-18-1960 DOA: 12/11/2020  PCP: Bernerd Limbo, MD  Patient coming from: Home  I have personally briefly reviewed patient's old medical records in [place]  Chief Complaint: Fever, Generalized weakness  HPI: Tracey Stevens is a 60 y.o. 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  Pt recently underwent induction chemotherapy with Blincyto during admit at [facility] 9/9-9/19.  Renal function (creat) peaked at 3.2 during that admit, was 2.8 on DC 2.0 baseline).  Pt had low grade fevers during that admit attributed to [facility].  Cultures negative.  Since admission, Renal function has been slowly deteriorating, creat was 4.5 in office on 9/26.  Lasix stopped, given 500cc bolus, planned to repeat creat on 9/29 and admit pt to hospital at [facility] if continuing to worsen.  Today pt presents to the ED with c/o fever, generalized weakness.  Tm at home 103.2.  Generalized body aches.  Has had a persistent, dry cough for "a while" that has been ongoing, unchanged.  No CP, no N/V/D, no dysuria.   ED Course: Tm 101.9, tachy to 120s, RR 30, WBC 4.x  Neg work up for source thus far includes: UA CXR CT non-con chest (just a stable pulmonary nodule).  BCx and UCx pending.  Attempt made to xfer to [facility] but [facility] is at capacity and has no beds.  Pt on wait list.   Review of Systems: As per HPI, otherwise all review of systems negative.  Past Medical History:  Diagnosis Date   Adrenal nodule ([place])    Cancer ([place])    cervical   CKD (chronic kidney disease) stage 3, GFR 30-59 ml/min (HCC)    Diabetes mellitus without complication ([place])    Diffuse Large B Cell Lymphoma 2015 remission   Hearing loss    Hyperparathyroidism ([place])    s/p parathyroidectomy   Hypertension    Hypertension    Multinodular goiter    Multiple renal cysts    Nephrolithiasis    Vertigo    Vitamin D deficiency     Past  Surgical History:  Procedure Laterality Date   ABDOMINAL HYSTERECTOMY     BREAST BIOPSY Right over 10 years ago   benign   Left ear surgery     LIVER BIOPSY  2014   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 She has never used smokeless tobacco. She reports that she does not drink alcohol and does not use drugs.  Allergies  Allergen Reactions   Amlodipine Swelling    Peripheral edema, ankles swell Peripheral edema     Family History  Problem Relation Age of Onset   Heart disease Mother    Hypertension Mother    Gastric cancer Father    Renal Disease Father    Cancer Maternal Grandmother    Leukemia Maternal Grandfather    Cancer Paternal Grandmother    Cancer Paternal Grandfather      Prior to Admission medications   Medication Sig Start Date End Date Taking? Authorizing Provider  allopurinol (ZYLOPRIM) 300 MG tablet Take 300 mg by mouth daily. 11/06/20  Yes [provider]  atorvastatin (LIPITOR) 20 MG tablet Take 20 mg by mouth daily.   Yes [provider]  cholecalciferol (VITAMIN D) 1000 units tablet Take 4,000 Units by mouth daily.    Yes [provider]  gabapentin (NEURONTIN) 300 MG capsule  Take 300 mg by mouth daily.   Yes [provider]  meclizine (ANTIVERT) 25 MG tablet Take 25 mg by mouth 2 (two) times daily as needed for dizziness.   Yes [provider]  methocarbamol (ROBAXIN) 500 MG tablet Take 500 mg by mouth daily as needed for muscle spasms. 06/05/20  Yes [provider]  Metoprolol Succinate 25 MG CS24 Take 25 tablets by mouth daily.   Yes [provider]  omeprazole (PRILOSEC) 40 MG capsule Take 40 mg by mouth daily. 10/18/20  Yes [provider]  ondansetron (ZOFRAN) 8 MG tablet Take 8 mg by mouth every 8 (eight) hours as needed for vomiting or nausea. 10/04/20  Yes [provider]    Physical Exam: Vitals:   12/11/20 2200 12/11/20  2234 12/11/20 2300 12/12/20 0000  BP: (!) 131/101  (!) 138/92   Pulse: (!) 126  (!) 125 (!) 119  Resp: (!) 30  (!) 30 (!) 29  Temp:  (!) 101.9 F (38.8 C)    TempSrc:  Oral    SpO2: 94%  94% 93%  Weight:      Height:        Constitutional: NAD, calm, comfortable Eyes: PERRL, lids and conjunctivae normal ENMT: Mucous membranes are moist. Posterior pharynx clear of any exudate or lesions.Normal dentition.  Neck: normal, supple, no masses, no thyromegaly Respiratory: clear to auscultation bilaterally, no wheezing, no crackles. Normal respiratory effort. No accessory muscle use.  Cardiovascular: Tachycardic no murmurs / rubs / gallops. 2+ extremity edema. 2+ pedal pulses. No carotid bruits.  Abdomen: no tenderness, no masses palpated. No hepatosplenomegaly. Bowel sounds positive.  Musculoskeletal: no clubbing / cyanosis. No joint deformity upper and lower extremities. Good ROM, no contractures. Normal muscle tone.  Skin: no rashes, lesions, ulcers. No induration Neurologic: CN 2-12 grossly intact. Sensation intact, DTR normal. Strength 5/5 in all 4.  Psychiatric: Normal judgment and insight. Alert and oriented x 3. Normal mood.    Labs on Admission: I have personally reviewed following labs and imaging studies  CBC: Recent Labs  Lab 12/11/20 2112  WBC 4.6  NEUTROABS 2.2  HGB 9.0*  HCT 29.2*  MCV 98.6  PLT 702*   Basic Metabolic Panel: Recent Labs  Lab 12/11/20 2112  NA 138  K 4.3  CL 105  CO2 23  GLUCOSE 122*  BUN 31*  CREATININE 5.01*  CALCIUM 8.5*   GFR: Estimated Creatinine Clearance: 19.6 mL/min (A) (by C-G formula based on SCr of 5.01 mg/dL (H)). Liver Function Tests: Recent Labs  Lab 12/11/20 2112  AST 21  ALT 12  ALKPHOS 54  BILITOT 0.5  PROT 5.5*  ALBUMIN 2.5*   No results for input(s): LIPASE, AMYLASE in the last 168 hours. No results for input(s): AMMONIA in the last 168 hours. Coagulation Profile: Recent Labs  Lab 12/11/20 2112  INR 1.1    Cardiac Enzymes: No results for input(s): CKTOTAL, CKMB, CKMBINDEX, TROPONINI in the last 168 hours. BNP (last 3 results) No results for input(s): PROBNP in the last 8760 hours. HbA1C: No results for input(s): HGBA1C in the last 72 hours. CBG: No results for input(s): GLUCAP in the last 168 hours. Lipid Profile: No results for input(s): CHOL, HDL, LDLCALC, TRIG, CHOLHDL, LDLDIRECT in the last 72 hours. Thyroid Function Tests: No results for input(s): TSH, T4TOTAL, FREET4, T3FREE, THYROIDAB in the last 72 hours. Anemia Panel: No results for input(s): VITAMINB12, FOLATE, FERRITIN, TIBC, IRON, RETICCTPCT in the last 72 hours. Urine analysis:  Component Value Date/Time   COLORURINE YELLOW (A) 12/11/2020 2129   APPEARANCEUR CLEAR (A) 12/11/2020 2129   LABSPEC 1.015 12/11/2020 2129   PHURINE 6.0 12/11/2020 2129   GLUCOSEU NEGATIVE 12/11/2020 2129   HGBUR MODERATE (A) 12/11/2020 2129   BILIRUBINUR NEGATIVE 12/11/2020 2129   KETONESUR NEGATIVE 12/11/2020 2129   PROTEINUR >300 (A) 12/11/2020 2129   NITRITE NEGATIVE 12/11/2020 2129   LEUKOCYTESUR NEGATIVE 12/11/2020 2129    Radiological Exams on Admission: CT Chest Wo Contrast  Result Date: 12/11/2020 CLINICAL DATA:  Persistent cough, fever EXAM: CT CHEST WITHOUT CONTRAST TECHNIQUE: Multidetector CT imaging of the chest was performed following the standard protocol without IV contrast. COMPARISON:  Chest radiograph dated 12/11/2020. Partial comparison to CT abdomen pelvis dated 08/30/2020. FINDINGS: Cardiovascular: Heart is normal in size.  No pericardial effusion. No evidence of thoracic aortic aneurysm. Right chest port terminates at the cavoatrial junction. Mediastinum/Nodes: No suspicious mediastinal lymphadenopathy. Visualized thyroid is unremarkable. Lungs/Pleura: Evaluation lung parenchyma is constrained by respiratory motion. Within that constraint, is a 10 mm right lower lobe nodule (series 7/image 98), unchanged in retrospect  from prior CT abdomen/pelvis Mild linear scarring/atelectasis in the inferior lingula and medial right lower lobe. Mild pleural thickening in the medial right lower hemithorax. No pleural effusion or pneumothorax. Upper Abdomen: Visualized upper abdomen is notable for low-density thickening of the bilateral adrenal glands, favoring benign adrenal adenomas. Left upper pole renal cysts which are better evaluated on prior CT. Musculoskeletal: Degenerative changes of the visualized thoracolumbar spine. Moderate compression fracture deformity at T12, unchanged. IMPRESSION: No evidence of acute cardiopulmonary disease. 10 mm right lower lobe pulmonary nodule. Three month stability has been demonstrated. Consider follow-up CT chest in 6 months. This recommendation follows the consensus statement: Guidelines for Management of Small Pulmonary Nodules Detected on CT Images: From the Fleischner Society 2017; Radiology 2017; 284:228-243. Electronically Signed   By: Julian Hy M.D.   On: 12/11/2020 23:49   DG Chest Port 1 View  Result Date: 12/11/2020 CLINICAL DATA:  Questionable sepsis.  History of ALL. EXAM: PORTABLE CHEST 1 VIEW COMPARISON:  CT abdomen pelvis 08/30/2020 urogram 10/17/2015, CT FINDINGS: Right chest wall Port-A-Cath with tip overlying the superior cavoatrial junction. The heart and mediastinal contours are within normal limits. Question thickened right paratracheal stripe. Redemonstration of a right base pulmonary nodules measuring approximately 1.2 cm. No focal consolidation. No pulmonary edema. No pleural effusion. No pneumothorax. No acute osseous abnormality. IMPRESSION: 1. Question thickened right paratracheal stripe. Consider CT chest with Intravenous contrast if clinically indicated. 2. A persistent pericentimeter right base pulmonary nodule better evaluated on CT abdomen pelvis 08/30/2020. 3. Otherwise no acute cardiopulmonary abnormality. Electronically Signed   By: Iven Finn M.D.    On: 12/11/2020 21:07    EKG: Independently reviewed.  Assessment/Plan Principal Problem:   SIRS (systemic inflammatory response syndrome) (HCC) Active Problems:   CKD (chronic kidney disease) stage 5, GFR less than 15 ml/min (HCC)   ALL (acute lymphoblastic leukemia) (HCC)   AKI (acute kidney injury) ([place])    SIRS - Sepsis from unknown source vs Cytokine Release syndrome (known side effect of Blincyto). Stop Blincyto Giving empiric decadron 8mg  Q8H IV Sepsis pathway Empiric cefepime + vanc + flagyl Lactate nl Checking procalcitonin Will hold off on IVF bolus (BP 875I systolic, pt already has peripheral edema). BCx, UCx pending UA and CT chest unrevealing for source: Just a stable pulm nodule that they rec 6 month CT for AKI on CKD 4-5- Deteriorating renal function for  past 10 days prior to todays acute presentation (see also onc office note for details) Pt with biopsy proven FSGS ?TLS Uric acid only slightly elevated at 8.9, nl K LDH still pending But doubt TLS at this point BUN only 30 despite creat 5.0, pt with peripheral edema present, doesn't look pre-renal. Admitting pt over to [facility] Message sent to Dr. Augustin Coupe for AM nephrology consult Strict intake and output Renal US UA demonstrates severe proteinuria Not sure if they formally diagnosed nephrotic syndrome with a 24h urine collection, but pt certainly looks like she has nephrotic syndrome clinically in setting of biopsy proven FSGS. HTN - Holding metoprolol in setting of SIRS, possible sepsis  DVT prophylaxis: Heparin [place] Code Status: Full Family Communication: No family in room Disposition Plan: Transfer to [facility] when bed available Consults called: Dr. Augustin Coupe, Dr. Burr Medico Admission status: Admit to inpatient  Severity of Illness: The appropriate patient status for this patient is INPATIENT. Inpatient status is judged to be reasonable and necessary in order to provide the required intensity of service to ensure the patient's  safety. The patient's presenting symptoms, physical exam findings, and initial radiographic and laboratory data in the context of their chronic comorbidities is felt to place them at high risk for further clinical deterioration. Furthermore, it is not anticipated that the patient will be medically stable for discharge from the hospital within 2 midnights of admission. The following factors support the patient status of inpatient.   IP status due to: 1) SIRS either from sepsis of unknown source vs CRS 2) Worsening renal function   * I certify that at the point of admission it is my clinical judgment that the patient will require inpatient hospital care spanning beyond 2 midnights from the point of admission due to high intensity of service, high risk for further deterioration and high frequency of surveillance required.*   Asbury Hair M. DO [facility]  How to contact the [facility] Attending or Consulting provider [place] or covering provider during after hours [place], for this patient?  Check the care team in [facility] and look for a) attending/consulting [facility] provider listed and b) the [facility] team listed Log into [email or web address]  Amion Physician Scheduling and messaging for groups and whole hospitals  On call and physician scheduling software for group practices, residents, hospitalists and other medical providers for call, clinic, rotation and shift schedules. OnCall [employer] is a hospital-wide system for scheduling doctors and paging doctors on call. EasyPlot is for scientific plotting and data analysis.  [email or web address]  and use [place]'s universal password to access. If you do not have the password, please contact the hospital operator.  Locate the [facility] provider you are looking for under [facility] and page to a number that you can be directly reached. If you still have difficulty reaching the provider, please page the [facility] (Director on Call) for the Hospitalists listed on amion for  assistance.  12/12/2020, 1:06 AM

## 2020-12-12 NOTE — Progress Notes (Signed)
   Patient seen and examined at bedside, patient admitted after midnight, please see earlier detailed admission note by Etta Quill, DO. Briefly, patient presented secondary to fever and generalized weakness. Symptoms concerning for SIRS with possible sepsis in setting of known ALL on immunotherapy. Blinatumomab held on admission. Empiric antibiotics started and cultures obtained. Attempts to transfer to [facility] when a bed is available.  Subjective: Patient reports feeling better. No issues overnight. Does have a persistent cough but that hs been present for months. No dyspnea or chest pain. No other symptoms.  BP (!) 140/117   Pulse (!) 107   Temp 100.1 F (37.8 C) (Oral)   Resp (!) 21   Ht 6\' 1"  (1.854 m)   Wt (!) 149.7 kg   SpO2 96%   BMI 43.54 kg/m   General exam: Appears calm and comfortable Respiratory system: Clear to auscultation. Respiratory effort normal. Cardiovascular system: S1 & S2 heard, RRR. No murmurs, rubs, gallops or clicks. Gastrointestinal system: Abdomen is nondistended, soft and nontender. No organomegaly or masses felt. Normal bowel sounds heard. Central nervous system: Alert and oriented. No focal neurological deficits. Musculoskeletal: generalized edema. No calf tenderness Skin: No cyanosis. No rashes Psychiatry: Judgement and insight appear normal. Mood & affect appropriate.   Brief assessment/Plan:  SIRS Possibly infectious source vs cytokine release syndrome. Patient started on broad spectrum antibiotics with Vancomycin, Cefepime, Flagyl. Blood and urine cultures obtained and are pending. Blinatumomab discontinued on admission. Procalcitonin elevated. No source on urinalysis/CT chest. -Hold Decadron for now -Follow-up blood and urine cultures -Continue empiric Vancomycin, Cefepime, Flagyl  AKI on CKD stage IV FSGS Baseline creatinine around 2.5 per primary oncologist. Recent Creatinine up to around 4 on 9/26. Creatinine of 5.01 on admission.  Renal ultrasound significant for evidence of medical renal disease. Patient is making urine and has 500 mL of urine in the canister by her bedside this morning. Nephrology consulted. -Nephrology recommendations: CPK, UOP, BMP, avoid nephrotoxic agents, decrease IVF rate, possible diuresis  Primary hypertension Patient is on metoprolol succinate 25 mg daily as an outpatient which has been held secondary to SIRS. Blood pressure   ALL Patient is followed by Dr. Joan Mayans at [facility]. Currently on Blinatumomab therapy as of 9/9.  Hyperlipidemia On Lipitor as an outpatient -Continue Lipitor; if CK is elevated, will discontinue  GERD On omeprazole as an outpatient -Protonix while inpatient  Right lower lobe pulmonary nodule 10 mm in size. Stable over the past 3 months with recommendation for follow-up CT chest  in 6 months   Family communication: None at bedside DVT prophylaxis: Heparin subq Disposition: Transfer to [facility] hospital once bed is available; admitted to progressive care unit at [facility] (confirmed with nephrology, no need for [facility] transfer as of right now)  Cordelia Poche, MD [facility] 12/12/2020, 1:01 PM

## 2020-12-12 NOTE — Consult Note (Signed)
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  She also has a history of previous chronic NSAID use and recurrent nephrolithiasis as well as cervical cancer status post hysterectomy and diffuse large B-cell lymphoma.  She presented to the emergency room overnight with fever and increased generalized weakness and was found to have worsening renal function at the oncologist office 3 days prior to admission with a creatinine up to 4.5 from previously being around 2.8.  Labs done here in the emergency room showed a further rise of creatinine to 5.0 without any acute electrolyte abnormality.  Renal ultrasound did not show any obstruction.  Recently her ARB and furosemide were both discontinued for fear of her worsening renal function.  Her potassium level is normal at 4.8 with uric acid level of 8.9 and she has been nonoliguric overnight.  Urinalysis indicates proteinuria without any hematuria.  Past Medical History:  Diagnosis Date   Adrenal nodule (HCC)    Cancer (HCC)    cervical   CKD (chronic kidney disease) stage 3, GFR 30-59 ml/min (HCC)    Diabetes mellitus without complication ([place])    Diffuse Large B Cell Lymphoma 2015 remission   Hearing loss    Hyperparathyroidism ([place])    s/p parathyroidectomy   Hypertension    Hypertension    Multinodular goiter    Multiple renal cysts    Nephrolithiasis    Vertigo    Vitamin D deficiency     Past Surgical History:  Procedure Laterality Date   ABDOMINAL HYSTERECTOMY     BREAST BIOPSY Right over 10 years ago   benign   Left ear surgery      LIVER BIOPSY  2014   confirmed to be DLBCL   PARATHYROIDECTOMY      Family History  Problem Relation Age of Onset   Heart disease Mother    Hypertension Mother    Gastric cancer Father    Renal Disease Father    Cancer Maternal Grandmother    Leukemia Maternal Grandfather    Cancer Paternal Grandmother    Cancer Paternal Grandfather     Social History:  reports that she has never smoked. She has never used smokeless tobacco. She reports that she does not drink alcohol and does not use drugs.  Allergies:  Allergies  Allergen Reactions   Amlodipine Swelling    Peripheral edema, ankles swell Peripheral edema     Medications: I have reviewed the patient's current medications. Scheduled:  allopurinol  300 mg Oral Daily   atorvastatin  20 mg Oral Daily   dexamethasone (DECADRON) injection  8 mg Intravenous Q8H   gabapentin  300 mg Oral Daily   heparin  5,000 Units Subcutaneous Q8H   vancomycin variable dose per unstable renal function (pharmacist dosing)   Does not apply See admin instructions   BMP Latest Ref Rng & Units 12/12/2020 12/11/2020 09/11/2020  Glucose 70 - 99 mg/dL 123(H) 122(H) 96  BUN 8 - 23 mg/dL 33(H) 31(H) 33(H)  Creatinine 0.44 - 1.00 mg/dL 5.06(H) 5.01(H) 3.32(HH)  Sodium 135 - 145 mmol/L 140 138 144  Potassium 3.5 - 5.1 mmol/L 4.8 4.3 4.5  Chloride 98 - 111 mmol/L  111 105 106  CO2 22 - 32 mmol/L 21(L) 23 28  Calcium 8.9 - 10.3 mg/dL 8.7(L) 8.5(L) 10.0   CBC Latest Ref Rng & Units 12/12/2020 12/11/2020 09/11/2020  WBC 4.0 - 10.5 K/uL 4.1 4.6 31.9(H)  Hemoglobin 12.0 - 15.0 g/dL 9.2(L) 9.0(L) 8.4(L)  Hematocrit 36.0 - 46.0 % 29.5(L) 29.2(L) 25.7(L)  Platelets 150 - 400 K/uL 115(L) 126(L) 218   Urinalysis    Component Value Date/Time   COLORURINE YELLOW (A) 12/11/2020 2129   APPEARANCEUR CLEAR (A) 12/11/2020 2129   LABSPEC 1.015 12/11/2020 2129   PHURINE 6.0 12/11/2020 2129   GLUCOSEU NEGATIVE 12/11/2020 2129   HGBUR MODERATE (A) 12/11/2020 2129    BILIRUBINUR NEGATIVE 12/11/2020 2129   KETONESUR NEGATIVE 12/11/2020 2129   PROTEINUR >300 (A) 12/11/2020 2129   NITRITE NEGATIVE 12/11/2020 2129   LEUKOCYTESUR NEGATIVE 12/11/2020 2129   CT Chest Wo Contrast  Result Date: 12/11/2020 CLINICAL DATA:  Persistent cough, fever EXAM: CT CHEST WITHOUT CONTRAST TECHNIQUE: Multidetector CT imaging of the chest was performed following the standard protocol without IV contrast. COMPARISON:  Chest radiograph dated 12/11/2020. Partial comparison to CT abdomen pelvis dated 08/30/2020. FINDINGS: Cardiovascular: Heart is normal in size.  No pericardial effusion. No evidence of thoracic aortic aneurysm. Right chest port terminates at the cavoatrial junction. Mediastinum/Nodes: No suspicious mediastinal lymphadenopathy. Visualized thyroid is unremarkable. Lungs/Pleura: Evaluation lung parenchyma is constrained by respiratory motion. Within that constraint, is a 10 mm right lower lobe nodule (series 7/image 98), unchanged in retrospect from prior CT abdomen/pelvis Mild linear scarring/atelectasis in the inferior lingula and medial right lower lobe. Mild pleural thickening in the medial right lower hemithorax. No pleural effusion or pneumothorax. Upper Abdomen: Visualized upper abdomen is notable for low-density thickening of the bilateral adrenal glands, favoring benign adrenal adenomas. Left upper pole renal cysts which are better evaluated on prior CT. Musculoskeletal: Degenerative changes of the visualized thoracolumbar spine. Moderate compression fracture deformity at T12, unchanged. IMPRESSION: No evidence of acute cardiopulmonary disease. 10 mm right lower lobe pulmonary nodule. Three month stability has been demonstrated. Consider follow-up CT chest in 6 months. This recommendation follows the consensus statement: Guidelines for Management of Small Pulmonary Nodules Detected on CT Images: From the Fleischner Society 2017; Radiology 2017; 284:228-243. Electronically  Signed   By: Julian Hy M.D.   On: 12/11/2020 23:49   US RENAL  Result Date: 12/12/2020 CLINICAL DATA:  Acute kidney injury EXAM: RENAL / URINARY TRACT ULTRASOUND COMPLETE COMPARISON:  CT abdomen/pelvis dated 08/30/2020 FINDINGS: Right Kidney: Renal measurements: 10.6 x 4.9 x 4.5 cm = volume: 122 mL. Echogenic renal parenchyma. Scattered simple right renal cysts measuring up to 1.7 cm. No hydronephrosis. Left Kidney: Renal measurements: 12.3 x 6.1 x 5.2 cm = volume: 203 mL. Echogenic renal parenchyma. Scattered left renal cysts, including dominant simple cysts measuring 4.9 and 3.4 cm. No hydronephrosis. Bladder: Appears normal for degree of bladder distention. Other: Left pleural effusion. IMPRESSION: Echogenic renal parenchyma, suggesting medical renal disease. No hydronephrosis. Bilateral renal cysts, measuring up to 4.9 cm on the left, simple. Electronically Signed   By: Julian Hy M.D.   On: 12/12/2020 02:19   DG Chest Port 1 View  Result Date: 12/11/2020 CLINICAL DATA:  Questionable sepsis.  History of ALL. EXAM: PORTABLE CHEST 1 VIEW COMPARISON:  CT abdomen pelvis 08/30/2020 urogram 10/17/2015, CT FINDINGS: Right chest wall Port-A-Cath with tip overlying the superior cavoatrial junction. The heart and mediastinal contours are within normal limits. Question thickened right paratracheal stripe. Redemonstration  of a right base pulmonary nodules measuring approximately 1.2 cm. No focal consolidation. No pulmonary edema. No pleural effusion. No pneumothorax. No acute osseous abnormality. IMPRESSION: 1. Question thickened right paratracheal stripe. Consider CT chest with Intravenous contrast if clinically indicated. 2. A persistent pericentimeter right base pulmonary nodule better evaluated on CT abdomen pelvis 08/30/2020. 3. Otherwise no acute cardiopulmonary abnormality. Electronically Signed   By: Iven Finn M.D.   On: 12/11/2020 21:07    Review of Systems  Constitutional:  Positive  for activity change, appetite change and fatigue.  HENT:  Positive for sore throat. Negative for nosebleeds.   Eyes:  Negative for redness and visual disturbance.  Respiratory:  Positive for shortness of breath. Negative for cough, chest tightness and wheezing.   Cardiovascular:  Positive for leg swelling.  Gastrointestinal:  Positive for abdominal distention and nausea. Negative for constipation and vomiting.  Endocrine: Negative for polydipsia and polyuria.  Genitourinary:  Negative for dysuria, frequency and hematuria.  Musculoskeletal:  Positive for arthralgias and back pain.  Skin:  Negative for rash and wound.  Neurological:  Positive for weakness and light-headedness. Negative for dizziness.  Blood pressure (!) 140/117, pulse (!) 107, temperature 100.1 F (37.8 C), temperature source Oral, resp. rate (!) 21, height 6\' 1"  (1.854 m), weight (!) 149.7 kg, SpO2 96 %. Physical Exam Vitals and nursing note reviewed.  Constitutional:      General: She is not in acute distress.    Appearance: Normal appearance. She is obese. She is not ill-appearing.  HENT:     Head: Normocephalic and atraumatic.     Right Ear: External ear normal.     Left Ear: External ear normal.     Nose: Nose normal.     Mouth/Throat:     Mouth: Mucous membranes are moist.     Pharynx: Oropharynx is clear.  Eyes:     General: No scleral icterus.    Extraocular Movements: Extraocular movements intact.     Conjunctiva/sclera: Conjunctivae normal.  Cardiovascular:     Rate and Rhythm: Normal rate and regular rhythm.     Pulses: Normal pulses.     Heart sounds: Normal heart sounds.  Pulmonary:     Effort: Pulmonary effort is normal.     Breath sounds: Normal breath sounds. No wheezing or rales.  Abdominal:     General: Abdomen is flat. Bowel sounds are normal.     Palpations: Abdomen is soft.     Tenderness: There is no abdominal tenderness.  Musculoskeletal:     Cervical back: Normal range of motion and  neck supple.     Right lower leg: Edema present.     Left lower leg: Edema present.     Comments: 2-3+ bilateral lower extremity edema with 1-2+ anasarca  Skin:    General: Skin is warm and dry.     Findings: No erythema.  Neurological:     General: No focal deficit present.     Mental Status: She is alert and oriented to person, place, and time.    Assessment/Plan: 1.  Acute kidney injury on chronic kidney disease stage IV: With underlying FSGS that appears to be chronic in nature with significant renal scarring.  Her acute injury appears to be unclear in etiology but based on the physical exam, likely with decreased effective arterial blood volume and significant third spacing.  Unclear whether she has an element of nephrotoxicity from the ongoing chemotherapy but does not appear to have clear evidence of tumor  lysis syndrome at this time.  I will check a CPK level to evaluate for possible rhabdomyolysis.  Renal ultrasound negative for any obstruction and urinalysis without any red flag findings except for dipstick blood without RBCs on microscopy.  She does not have any acute indications for hemodialysis and will be kept/[facility]ed [facility] at [facility]. Avoid nephrotoxic medications including NSAIDs and iodinated intravenous contrast exposure unless the latter is absolutely indicated.  Preferred narcotic agents for pain control are hydromorphone, fentanyl, and methadone. Morphine should not be used. Avoid Baclofen and avoid oral sodium phosphate and magnesium citrate based laxatives / bowel preps. Continue strict Input and Output monitoring. Will monitor the patient closely with you and intervene or adjust therapy as indicated by changes in clinical status/labs .  Will reduce rate of intravenous fluids at this time and determine need for diuresis (likely augmented by albumin) over the next 24 hours. 2.  Systemic inflammatory response syndrome: Sepsis of unclear etiology and started on empiric  broad-spectrum antibiotic therapy with cefepime, vancomycin and Flagyl with cultures pending.  Appears to be hemodynamically stable. 3.  Non-anion gap metabolic acidosis: Likely secondary to progressive chronic kidney disease, will check CPK level. 4.  Hypertension: Blood pressures marginally elevated, beta-blockers on hold in the setting of possible sepsis.  Continue to hold ARB/diuresis at this time.  Richey Doolittle K. 12/12/2020, 12:57 PM

## 2020-12-12 NOTE — Progress Notes (Signed)
Brief oncology note:  Chart reviewed.  Patient not seen.  Discussed with Dr. Irene Limbo.  The patient is currently under active treatment at [facility] and not actively being followed by our practice.  We would recommend immediate transfer to [facility] and sepsis work-up with empiric antibiotic coverage in the interim.   We would recommend reaching out directly to her primary oncologist at [facility] for questions regarding her care in the interim.  Mikey Bussing, DNP, AGPCNP-BC, AOCNP

## 2020-12-12 NOTE — Progress Notes (Signed)
Pharmacy Antibiotic Note  Tracey Stevens is a 60 y.o. female admitted on 12/11/2020 with  fever of unknown origin .  Pharmacy has been consulted for Cefepime & Vancomycin dosing.  AKI- Scr 5.01 (baseline ~2)  Plan: Cefepime 2gm IV q24h Vancomycin 2gm IV x1.  Check random level in 48-72h and re-dose when <15. Monitor renal function and cx data   Height: 6\' 1"  (185.4 cm) Weight: (!) 149.7 kg (330 lb) IBW/kg (Calculated) : 75.4  Temp (24hrs), Avg:101.1 F (38.4 C), Min:100.3 F (37.9 C), Max:101.9 F (38.8 C)  Recent Labs  Lab 12/11/20 2112  WBC 4.6  CREATININE 5.01*  LATICACIDVEN 1.5    Estimated Creatinine Clearance: 19.6 mL/min (A) (by C-G formula based on SCr of 5.01 mg/dL (H)).    Allergies  Allergen Reactions   Amlodipine Swelling    Peripheral edema, ankles swell Peripheral edema     Antimicrobials this admission: 9/28 Cefepime >>  9/29 Vancomycin >>  9/29 Flagyl>>  Dose adjustments this admission:  Microbiology results: 9/28 BCx:  9/28 UCx:    Thank you for allowing pharmacy to be a part of this patient's care.  Netta Cedars PharmD 12/12/2020 12:34 AM

## 2020-12-13 DIAGNOSIS — R911 Solitary pulmonary nodule: Secondary | ICD-10-CM

## 2020-12-13 DIAGNOSIS — K219 Gastro-esophageal reflux disease without esophagitis: Secondary | ICD-10-CM

## 2020-12-13 DIAGNOSIS — D649 Anemia, unspecified: Secondary | ICD-10-CM

## 2020-12-13 DIAGNOSIS — D696 Thrombocytopenia, unspecified: Secondary | ICD-10-CM

## 2020-12-13 DIAGNOSIS — E785 Hyperlipidemia, unspecified: Secondary | ICD-10-CM

## 2020-12-13 DIAGNOSIS — N184 Chronic kidney disease, stage 4 (severe): Secondary | ICD-10-CM

## 2020-12-13 LAB — BASIC METABOLIC PANEL
Anion gap: 11 (ref 5–15)
BUN: 52 mg/dL — ABNORMAL HIGH (ref 8–23)
CO2: 21 mmol/L — ABNORMAL LOW (ref 22–32)
Calcium: 8.3 mg/dL — ABNORMAL LOW (ref 8.9–10.3)
Chloride: 104 mmol/L (ref 98–111)
Creatinine, Ser: 5.76 mg/dL — ABNORMAL HIGH (ref 0.44–1.00)
GFR, Estimated: 8 mL/min — ABNORMAL LOW (ref >60)
Glucose, Bld: 131 mg/dL — ABNORMAL HIGH (ref 70–99)
Potassium: 5.7 mmol/L — ABNORMAL HIGH (ref 3.5–5.1)
Sodium: 136 mmol/L (ref 135–145)

## 2020-12-13 LAB — CK: Total CK: 49 U/L (ref 38–234)

## 2020-12-13 LAB — RENAL FUNCTION PANEL
Albumin: 2.1 g/dL — ABNORMAL LOW (ref 3.5–5.0)
Anion gap: 9 (ref 5–15)
BUN: 51 mg/dL — ABNORMAL HIGH (ref 8–23)
CO2: 22 mmol/L (ref 22–32)
Calcium: 8.3 mg/dL — ABNORMAL LOW (ref 8.9–10.3)
Chloride: 106 mmol/L (ref 98–111)
Creatinine, Ser: 5.9 mg/dL — ABNORMAL HIGH (ref 0.44–1.00)
GFR, Estimated: 8 mL/min — ABNORMAL LOW (ref >60)
Glucose, Bld: 134 mg/dL — ABNORMAL HIGH (ref 70–99)
Phosphorus: 6.9 mg/dL — ABNORMAL HIGH (ref 2.5–4.6)
Potassium: 5.6 mmol/L — ABNORMAL HIGH (ref 3.5–5.1)
Sodium: 137 mmol/L (ref 135–145)

## 2020-12-13 LAB — DIFFERENTIAL
Abs Immature Granulocytes: 0.09 10*3/uL — ABNORMAL HIGH (ref 0.00–0.07)
Basophils Absolute: 0 10*3/uL (ref 0.0–0.1)
Basophils Relative: 0 %
Eosinophils Absolute: 0 10*3/uL (ref 0.0–0.5)
Eosinophils Relative: 0 %
Immature Granulocytes: 4 %
Lymphocytes Relative: 12 %
Lymphs Abs: 0.3 10*3/uL — ABNORMAL LOW (ref 0.7–4.0)
Monocytes Absolute: 0.2 10*3/uL (ref 0.1–1.0)
Monocytes Relative: 7 %
Neutro Abs: 1.8 10*3/uL (ref 1.7–7.7)
Neutrophils Relative %: 77 %

## 2020-12-13 LAB — CBC
HCT: 27.8 % — ABNORMAL LOW (ref 36.0–46.0)
Hemoglobin: 8.9 g/dL — ABNORMAL LOW (ref 12.0–15.0)
MCH: 30.4 pg (ref 26.0–34.0)
MCHC: 32 g/dL (ref 30.0–36.0)
MCV: 94.9 fL (ref 80.0–100.0)
Platelets: 92 10*3/uL — ABNORMAL LOW (ref 150–400)
RBC: 2.93 MIL/uL — ABNORMAL LOW (ref 3.87–5.11)
RDW: 16 % — ABNORMAL HIGH (ref 11.5–15.5)
WBC: 2.5 10*3/uL — ABNORMAL LOW (ref 4.0–10.5)
nRBC: 2.4 % — ABNORMAL HIGH (ref 0.0–0.2)

## 2020-12-13 LAB — URIC ACID: Uric Acid, Serum: 9 mg/dL — ABNORMAL HIGH (ref 2.5–7.1)

## 2020-12-13 LAB — GLUCOSE, CAPILLARY
Glucose-Capillary: 142 mg/dL — ABNORMAL HIGH (ref 70–99)
Glucose-Capillary: 132 mg/dL — ABNORMAL HIGH (ref 70–99)
Glucose-Capillary: 126 mg/dL — ABNORMAL HIGH (ref 70–99)
Glucose-Capillary: 126 mg/dL — ABNORMAL HIGH (ref 70–99)
Glucose-Capillary: 118 mg/dL — ABNORMAL HIGH (ref 70–99)
Glucose-Capillary: 148 mg/dL — ABNORMAL HIGH (ref 70–99)

## 2020-12-13 LAB — PROCALCITONIN: Procalcitonin: 3.31 ng/mL

## 2020-12-13 MED ORDER — SODIUM ZIRCONIUM CYCLOSILICATE 10 G PO PACK
10.0000 g | PACK | Freq: Three times a day (TID) | ORAL | Status: DC
  Administered 2020-12-13 – 2020-12-15 (×6): 10 g via ORAL
  Filled 2020-12-13 (×5): qty 1

## 2020-12-13 MED ORDER — GUAIFENESIN-DM 100-10 MG/5ML PO SYRP
5.0000 mL | ORAL_SOLUTION | ORAL | Status: DC | PRN
  Administered 2020-12-13 – 2020-12-14 (×2): 5 mL via ORAL
  Filled 2020-12-13 (×2): qty 10

## 2020-12-13 MED ORDER — FUROSEMIDE 10 MG/ML IJ SOLN
INTRAMUSCULAR | Status: AC
  Filled 2020-12-13: qty 4

## 2020-12-13 MED ORDER — FUROSEMIDE 10 MG/ML IJ SOLN
80.0000 mg | Freq: Once | INTRAMUSCULAR | Status: AC
  Administered 2020-12-13: 80 mg via INTRAVENOUS
  Filled 2020-12-13: qty 8

## 2020-12-13 MED ORDER — SODIUM ZIRCONIUM CYCLOSILICATE 10 G PO PACK: 10.0000 g | PACK | Freq: Two times a day (BID) | ORAL | Status: DC

## 2020-12-13 NOTE — Progress Notes (Signed)
PROGRESS NOTE    Tracey Stevens  AVW:[id number] DOB: 02-26-1961 DOA: 12/11/2020 PCP: Bernerd Limbo, MD   Brief Narrative: Tracey Stevens is a 60 y.o. 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 Patient presented secondary to fever and generalized weakness. Symptoms concerning for SIRS with possible sepsis in setting of known ALL on immunotherapy. Blinatumomab held on admission. Empiric antibiotics started and cultures obtained. Attempts to transfer to [facility] when a bed is available.   Assessment & Plan:   Principal Problem:   SIRS (systemic inflammatory response syndrome) (HCC) Active Problems:   CKD (chronic kidney disease) stage 5, GFR less than 15 ml/min (HCC)   ALL (acute lymphoblastic leukemia) (HCC)   AKI (acute kidney injury) ([place])   SIRS Possibly infectious source vs cytokine release syndrome. Patient started on broad spectrum antibiotics with Vancomycin, Cefepime, Flagyl. Blood and urine cultures obtained and are pending. Blinatumomab discontinued on admission. Procalcitonin elevated. No source on urinalysis/CT chest. Urine culture with insignificant growth. -Follow-up blood (no growth x1 day) -Continue empiric Vancomycin, Cefepime, Flagyl   AKI on CKD stage IV FSGS Baseline creatinine around 2.5 per primary oncologist. Recent Creatinine up to around 4 on 9/26. Creatinine of 5.01 on admission. Renal ultrasound significant for evidence of medical renal disease. Patient is making urine and has 500 mL of urine in the canister by her bedside this morning. Nephrology consulted. Creatinine worsened. Documented UOP of 415 over the last 24 hours -Nephrology recommendations: furosemide  Chronic anemia Stable.  Leukopenia Mediated by lymphocytopenia. Normal neutrophil count. -Daily CBC with differential  Thrombocytopenia In setting of acute illness. No evidence of bleeding. No bruising noted. -Daily CBC   Primary hypertension Patient is on  metoprolol succinate 25 mg daily as an outpatient which has been held secondary to SIRS. Blood pressure normotensive at this time.   ALL Patient is followed by Dr. Joan Mayans at [facility]. Currently on Blinatumomab therapy as of 9/9 which is held this admission; confirmed with primary oncologist.   Hyperlipidemia On Lipitor as an outpatient   GERD On omeprazole as an outpatient -Protonix while inpatient   Right lower lobe pulmonary nodule 10 mm in size. Stable over the past 3 months with recommendation for follow-up CT chest  in 6 months    DVT prophylaxis: SCDs Code Status:   Code Status: Full Code Family Communication: None at bedside Disposition Plan: Plan for transfer to [facility] when bed is available   Consultants:  Nephrology  Procedures:  None  Antimicrobials: Vancomycin Cefepime Flagyl    Subjective: Patient reports no issues overnight except for her chronic cough. About 300 mL of urine in her bedside canister.  Objective: Vitals:   12/12/20 2053 12/13/20 0100 12/13/20 0531 12/13/20 1232  BP: (!) 152/94 117/67 (!) 142/81 (!) 152/88  Pulse: (!) 106 97 (!) 107 (!) 103  Resp: 20 20 19  (!) 22  Temp: 99.5 F (37.5 C) (!) 97.3 F (36.3 C) 98.9 F (37.2 C) 98 F (36.7 C)  TempSrc: Oral Oral Oral Oral  SpO2: 96% 97% 97% 99%  Weight:      Height:        Intake/Output Summary (Last 24 hours) at 12/13/2020 1520 Last data filed at 12/13/2020 1300 Gross per 24 hour  Intake 568.5 ml  Output 715 ml  Net -146.5 ml   Filed Weights   12/11/20 2033 12/11/20 2133 12/12/20 1612  Weight: (!) 146.1 kg (!) 149.7 kg (!) 156.1 kg  Examination:  General exam: Appears calm and comfortable Respiratory system: Clear to auscultation. Respiratory effort normal. Cardiovascular system: S1 & S2 heard, RRR. No murmurs, rubs, gallops or clicks. Gastrointestinal system: Abdomen is nondistended, soft and nontender. No organomegaly or masses felt. Normal bowel sounds  heard. Central nervous system: Alert and oriented. No focal neurological deficits. Musculoskeletal: BLE edema. No calf tenderness Skin: No cyanosis. No rashes Psychiatry: Judgement and insight appear normal. Mood & affect appropriate.     Data Reviewed: I have personally reviewed following labs and imaging studies  CBC Lab Results  Component Value Date   WBC 2.5 (L) 12/13/2020   RBC 2.93 (L) 12/13/2020   HGB 8.9 (L) 12/13/2020   HCT 27.8 (L) 12/13/2020   MCV 94.9 12/13/2020   MCH 30.4 12/13/2020   PLT 92 (L) 12/13/2020   MCHC 32.0 12/13/2020   RDW 16.0 (H) 12/13/2020   LYMPHSABS 0.3 (L) 12/13/2020   MONOABS 0.2 12/13/2020   EOSABS 0.0 12/13/2020   BASOSABS 0.0 57/26/2035     Last metabolic panel Lab Results  Component Value Date   NA 136 12/13/2020   K 5.7 (H) 12/13/2020   CL 104 12/13/2020   CO2 21 (L) 12/13/2020   BUN 52 (H) 12/13/2020   CREATININE 5.76 (H) 12/13/2020   GLUCOSE 131 (H) 12/13/2020   GFRNONAA 8 (L) 12/13/2020   GFRAA 30 (L) 01/10/2019   CALCIUM 8.3 (L) 12/13/2020   PHOS 6.9 (H) 12/13/2020   PROT 5.3 (L) 12/12/2020   ALBUMIN 2.1 (L) 12/13/2020   BILITOT 0.7 12/12/2020   ALKPHOS 47 12/12/2020   AST 21 12/12/2020   ALT 11 12/12/2020   ANIONGAP 11 12/13/2020    CBG (last 3)  Recent Labs    12/13/20 0527 12/13/20 0710 12/13/20 1117  GLUCAP 132* 126* 126*     GFR: Estimated Creatinine Clearance: 17.4 mL/min (A) (by C-G formula based on SCr of 5.76 mg/dL (H)).  Coagulation Profile: Recent Labs  Lab 12/11/20 2112 12/12/20 0403  INR 1.1 1.2    Recent Results (from the past 240 hour(s))  Blood Culture (routine x 2)     Status: None (Preliminary result)   Collection Time: 12/11/20 12:25 AM   Specimen: BLOOD  Result Value Ref Range Status   Specimen Description   Final    BLOOD RIGHT ANTECUBITAL Performed at [facility], [place] [address], [place], [place] [address]    Special Requests   Final    BOTTLES DRAWN AEROBIC AND  ANAEROBIC Blood Culture adequate volume Performed at [facility] [address] [place], [place] [address]    Culture   Final    NO GROWTH 1 DAY Performed at [facility], [place] [address], [place], [place] [address]    Report Status PENDING  Incomplete  Resp Panel by RT-PCR (Flu A&B, Covid) Nasopharyngeal Swab     Status: None   Collection Time: 12/11/20  9:12 PM   Specimen: Nasopharyngeal Swab; Nasopharyngeal(NP) swabs in vial transport medium  Result Value Ref Range Status   SARS Coronavirus 2 by RT PCR NEGATIVE NEGATIVE Final    Comment: (NOTE) SARS-CoV-2 target nucleic acids are NOT DETECTED.  The SARS-CoV-2 RNA is generally detectable in upper respiratory specimens during the acute phase of infection. The lowest concentration of SARS-CoV-2 viral copies this assay can detect is 138 copies/mL. A negative result does not preclude SARS-Cov-2 infection and should not be used as the sole basis for treatment or other patient management  decisions. A negative result may occur with  improper specimen collection/handling, submission of specimen other than nasopharyngeal swab, presence of viral mutation(s) within the areas targeted by this assay, and inadequate number of viral copies(<138 copies/mL). A negative result must be combined with clinical observations, patient history, and epidemiological information. The expected result is Negative.  Fact Sheet for Patients:  EntrepreneurPulse.com.au  Fact Sheet for Healthcare Providers:  IncredibleEmployment.be  This test is no t yet approved or cleared by the [place] FDA and  has been authorized for detection and/or diagnosis of SARS-CoV-2 by FDA under an Emergency Use Authorization (EUA). This EUA will remain  in effect (meaning this test can be used) for the duration of the COVID-19 declaration under Section 564(b)(1) of the Act, 21 U.S.C.section 360bbb-3(b)(1), unless  the authorization is terminated  or revoked sooner.       Influenza A by PCR NEGATIVE NEGATIVE Final   Influenza B by PCR NEGATIVE NEGATIVE Final    Comment: (NOTE) The Xpert Xpress SARS-CoV-2/FLU/RSV plus assay is intended as an aid in the diagnosis of influenza from Nasopharyngeal swab specimens and should not be used as a sole basis for treatment. Nasal washings and aspirates are unacceptable for Xpert Xpress SARS-CoV-2/FLU/RSV testing.  Fact Sheet for Patients: EntrepreneurPulse.com.au  Fact Sheet for Healthcare Providers: IncredibleEmployment.be  This test is not yet approved or cleared by the [place] FDA and has been authorized for detection and/or diagnosis of SARS-CoV-2 by FDA under an Emergency Use Authorization (EUA). This EUA will remain in effect (meaning this test can be used) for the duration of the COVID-19 declaration under Section 564(b)(1) of the Act, 21 U.S.C. section 360bbb-3(b)(1), unless the authorization is terminated or revoked.  Performed at [facility], [place] [address], [place], [place] [address]   Blood Culture (routine x 2)     Status: None (Preliminary result)   Collection Time: 12/11/20  9:12 PM   Specimen: BLOOD  Result Value Ref Range Status   Specimen Description   Final    BLOOD LEFT ANTECUBITAL Performed at [facility], [place] [address], [place], [place] [address]    Special Requests   Final    BOTTLES DRAWN AEROBIC AND ANAEROBIC Blood Culture adequate volume Performed at [facility] [address] [place], [place] [address]    Culture   Final    NO GROWTH 1 DAY Performed at [facility], [place] [address], [place], [place] [address]    Report Status PENDING  Incomplete  Urine Culture     Status: Abnormal   Collection Time: 12/11/20  9:29 PM   Specimen: Urine, Clean Catch  Result Value Ref Range Status   Specimen Description   Final     URINE, CLEAN CATCH Performed at [facility], [place] [address], [place], [place] [address]    Special Requests   Final    NONE Performed at [facility], [place] [address], [place], [place] [address]    Culture (A)  Final    <10,000 COLONIES/mL INSIGNIFICANT GROWTH Performed at [facility] [address], [place], [place] [address]    Report Status 12/12/2020 FINAL  Final        Radiology Studies: CT Chest Wo Contrast  Result Date: 12/11/2020 CLINICAL DATA:  Persistent cough, fever EXAM: CT CHEST WITHOUT CONTRAST TECHNIQUE: Multidetector CT imaging of the chest was performed following the standard protocol without IV contrast. COMPARISON:  Chest radiograph dated  12/11/2020. Partial comparison to CT abdomen pelvis dated 08/30/2020. FINDINGS: Cardiovascular: Heart is normal in size.  No pericardial effusion. No evidence of thoracic aortic aneurysm. Right chest port terminates at the cavoatrial junction. Mediastinum/Nodes: No suspicious mediastinal lymphadenopathy. Visualized thyroid is unremarkable. Lungs/Pleura: Evaluation lung parenchyma is constrained by respiratory motion. Within that constraint, is a 10 mm right lower lobe nodule (series 7/image 98), unchanged in retrospect from prior CT abdomen/pelvis Mild linear scarring/atelectasis in the inferior lingula and medial right lower lobe. Mild pleural thickening in the medial right lower hemithorax. No pleural effusion or pneumothorax. Upper Abdomen: Visualized upper abdomen is notable for low-density thickening of the bilateral adrenal glands, favoring benign adrenal adenomas. Left upper pole renal cysts which are better evaluated on prior CT. Musculoskeletal: Degenerative changes of the visualized thoracolumbar spine. Moderate compression fracture deformity at T12, unchanged. IMPRESSION: No evidence of acute cardiopulmonary disease. 10 mm right lower lobe pulmonary nodule. Three month stability has been  demonstrated. Consider follow-up CT chest in 6 months. This recommendation follows the consensus statement: Guidelines for Management of Small Pulmonary Nodules Detected on CT Images: From the Fleischner Society 2017; Radiology 2017; 284:228-243. Electronically Signed   By: Julian Hy M.D.   On: 12/11/2020 23:49   US RENAL  Result Date: 12/12/2020 CLINICAL DATA:  Acute kidney injury EXAM: RENAL / URINARY TRACT ULTRASOUND COMPLETE COMPARISON:  CT abdomen/pelvis dated 08/30/2020 FINDINGS: Right Kidney: Renal measurements: 10.6 x 4.9 x 4.5 cm = volume: 122 mL. Echogenic renal parenchyma. Scattered simple right renal cysts measuring up to 1.7 cm. No hydronephrosis. Left Kidney: Renal measurements: 12.3 x 6.1 x 5.2 cm = volume: 203 mL. Echogenic renal parenchyma. Scattered left renal cysts, including dominant simple cysts measuring 4.9 and 3.4 cm. No hydronephrosis. Bladder: Appears normal for degree of bladder distention. Other: Left pleural effusion. IMPRESSION: Echogenic renal parenchyma, suggesting medical renal disease. No hydronephrosis. Bilateral renal cysts, measuring up to 4.9 cm on the left, simple. Electronically Signed   By: Julian Hy M.D.   On: 12/12/2020 02:19   DG Chest Port 1 View  Result Date: 12/11/2020 CLINICAL DATA:  Questionable sepsis.  History of ALL. EXAM: PORTABLE CHEST 1 VIEW COMPARISON:  CT abdomen pelvis 08/30/2020 urogram 10/17/2015, CT FINDINGS: Right chest wall Port-A-Cath with tip overlying the superior cavoatrial junction. The heart and mediastinal contours are within normal limits. Question thickened right paratracheal stripe. Redemonstration of a right base pulmonary nodules measuring approximately 1.2 cm. No focal consolidation. No pulmonary edema. No pleural effusion. No pneumothorax. No acute osseous abnormality. IMPRESSION: 1. Question thickened right paratracheal stripe. Consider CT chest with Intravenous contrast if clinically indicated. 2. A persistent  pericentimeter right base pulmonary nodule better evaluated on CT abdomen pelvis 08/30/2020. 3. Otherwise no acute cardiopulmonary abnormality. Electronically Signed   By: Iven Finn M.D.   On: 12/11/2020 21:07        Scheduled Meds:  allopurinol  300 mg Oral Daily   atorvastatin  20 mg Oral Daily   Chlorhexidine Gluconate Cloth  6 each Topical Daily   dexamethasone (DECADRON) injection  8 mg Intravenous Q8H   furosemide       gabapentin  300 mg Oral Daily   heparin  5,000 Units Subcutaneous Q8H   pantoprazole  40 mg Oral Daily   sodium chloride flush  10-40 mL Intracatheter Q12H   sodium zirconium cyclosilicate  10 g Oral TID   vancomycin variable dose per unstable renal function (pharmacist dosing)   Does not apply See admin  instructions   Continuous Infusions:  ceFEPime (MAXIPIME) IV 2 g (12/12/20 2136)   metronidazole 500 mg (12/13/20 0529)     LOS: 1 day     Cordelia Poche, MD [facility] 12/13/2020, 3:20 PM  If 7PM-7AM, please contact night-coverage [email or web address]

## 2020-12-13 NOTE — Progress Notes (Signed)
0836- Sitting up in bed talking on telephone. Pt is alert and oriented x4. Breath sounds diminished bilaterally. 2+ pitting edema noted to bilateral lower extremities. AP regular.   0850- Dr. Lonny Prude in to see pt. Plan for diuresis and xfer to [facility] when bed available for oncology services.  1200- Sitting up in bed watching tv and talking on telephone. No complaints voiced. Pt reports increase in appetite. Will monitor.  1502- Lasix 80mg  ivp given via #20g in LAC. Pure wick urinary system connected to suction canister. Call bell within reach.

## 2020-12-13 NOTE — Progress Notes (Signed)
Patient ID: Tracey Stevens, female   DOB: [birth year], 60 y.o.   MRN: [id number] [facility] Progress Note   Assessment/ Plan:   1.  Acute kidney injury on chronic kidney disease stage IV: Underlying advancing chronic kidney disease stage IV secondary to FSGS with recent AKI from tumor lysis associated with ongoing chemotherapy.  Presented to the hospital with evidence of SIRS and worsening renal function-data so far pointing toward decreased effective blood volume versus ATN.  Overnight, creatinine higher with gentle volume expansion and she continues to have significant third spacing-we will attempt trial of furosemide. 2.  Systemic inflammatory response syndrome: Sepsis of unclear etiology and started on empiric broad-spectrum antibiotic therapy with cefepime, vancomycin and Flagyl with urine culture showing insignificant growth and blood cultures negative to date.  Chest x-ray without any evidence of pneumonia. 3.  Non-anion gap metabolic acidosis: Mild and secondary to advancing chronic kidney disease. 4.  Hypertension: Blood pressures marginally elevated- monitor with diuretic challenge and continue to hold ARB. 5.  Hyperkalemia: Secondary to acute kidney injury, treated with Lokelma and trying furosemide.  Subjective:   Reports to be feeling fair, bothered by leg swelling.   Objective:   BP (!) 142/81 (BP Location: Left Arm)   Pulse (!) 107   Temp 98.9 F (37.2 C) (Oral)   Resp 19   Ht 6\' 1"  (1.854 m)   Wt (!) 156.1 kg   SpO2 97%   BMI 45.41 kg/m   Intake/Output Summary (Last 24 hours) at 12/13/2020 1204 Last data filed at 12/13/2020 0600 Gross per 24 hour  Intake 568.5 ml  Output 415 ml  Net 153.5 ml   Weight change: 10.1 kg  Physical Exam: Gen: Appears comfortable sitting up in bed, not in distress CVS: Pulse regular rhythm, normal rate, S1 and S2 normal Resp: Diminished breath sounds over bases-poor inspiratory effort.  No distinct rales/wheeze Abd: Soft,  obese, nontender, bowel sounds normal Ext: 2-3+ lower extremity edema with 1-2+ anasarca  Imaging: CT Chest Wo Contrast  Result Date: 12/11/2020 CLINICAL DATA:  Persistent cough, fever EXAM: CT CHEST WITHOUT CONTRAST TECHNIQUE: Multidetector CT imaging of the chest was performed following the standard protocol without IV contrast. COMPARISON:  Chest radiograph dated 12/11/2020. Partial comparison to CT abdomen pelvis dated 08/30/2020. FINDINGS: Cardiovascular: Heart is normal in size.  No pericardial effusion. No evidence of thoracic aortic aneurysm. Right chest port terminates at the cavoatrial junction. Mediastinum/Nodes: No suspicious mediastinal lymphadenopathy. Visualized thyroid is unremarkable. Lungs/Pleura: Evaluation lung parenchyma is constrained by respiratory motion. Within that constraint, is a 10 mm right lower lobe nodule (series 7/image 98), unchanged in retrospect from prior CT abdomen/pelvis Mild linear scarring/atelectasis in the inferior lingula and medial right lower lobe. Mild pleural thickening in the medial right lower hemithorax. No pleural effusion or pneumothorax. Upper Abdomen: Visualized upper abdomen is notable for low-density thickening of the bilateral adrenal glands, favoring benign adrenal adenomas. Left upper pole renal cysts which are better evaluated on prior CT. Musculoskeletal: Degenerative changes of the visualized thoracolumbar spine. Moderate compression fracture deformity at T12, unchanged. IMPRESSION: No evidence of acute cardiopulmonary disease. 10 mm right lower lobe pulmonary nodule. Three month stability has been demonstrated. Consider follow-up CT chest in 6 months. This recommendation follows the consensus statement: Guidelines for Management of Small Pulmonary Nodules Detected on CT Images: From the Fleischner Society 2017; Radiology 2017; 284:228-243. Electronically Signed   By: Julian Hy M.D.   On: 12/11/2020 23:49   US RENAL  Result Date:  12/12/2020 CLINICAL DATA:  Acute kidney injury EXAM: RENAL / URINARY TRACT ULTRASOUND COMPLETE COMPARISON:  CT abdomen/pelvis dated 08/30/2020 FINDINGS: Right Kidney: Renal measurements: 10.6 x 4.9 x 4.5 cm = volume: 122 mL. Echogenic renal parenchyma. Scattered simple right renal cysts measuring up to 1.7 cm. No hydronephrosis. Left Kidney: Renal measurements: 12.3 x 6.1 x 5.2 cm = volume: 203 mL. Echogenic renal parenchyma. Scattered left renal cysts, including dominant simple cysts measuring 4.9 and 3.4 cm. No hydronephrosis. Bladder: Appears normal for degree of bladder distention. Other: Left pleural effusion. IMPRESSION: Echogenic renal parenchyma, suggesting medical renal disease. No hydronephrosis. Bilateral renal cysts, measuring up to 4.9 cm on the left, simple. Electronically Signed   By: Julian Hy M.D.   On: 12/12/2020 02:19   DG Chest Port 1 View  Result Date: 12/11/2020 CLINICAL DATA:  Questionable sepsis.  History of ALL. EXAM: PORTABLE CHEST 1 VIEW COMPARISON:  CT abdomen pelvis 08/30/2020 urogram 10/17/2015, CT FINDINGS: Right chest wall Port-A-Cath with tip overlying the superior cavoatrial junction. The heart and mediastinal contours are within normal limits. Question thickened right paratracheal stripe. Redemonstration of a right base pulmonary nodules measuring approximately 1.2 cm. No focal consolidation. No pulmonary edema. No pleural effusion. No pneumothorax. No acute osseous abnormality. IMPRESSION: 1. Question thickened right paratracheal stripe. Consider CT chest with Intravenous contrast if clinically indicated. 2. A persistent pericentimeter right base pulmonary nodule better evaluated on CT abdomen pelvis 08/30/2020. 3. Otherwise no acute cardiopulmonary abnormality. Electronically Signed   By: Iven Finn M.D.   On: 12/11/2020 21:07    Labs: BMET Recent Labs  Lab 12/11/20 2112 12/12/20 0403 12/13/20 0346  NA 138 140 137  K 4.3 4.8 5.6*  CL 105 111 106  CO2  23 21* 22  GLUCOSE 122* 123* 134*  BUN 31* 33* 51*  CREATININE 5.01* 5.06* 5.90*  CALCIUM 8.5* 8.7* 8.3*  PHOS  --   --  6.9*   CBC Recent Labs  Lab 12/11/20 2112 12/12/20 0403 12/13/20 0346  WBC 4.6 4.1 2.5*  NEUTROABS 2.2  --  1.8  HGB 9.0* 9.2* 8.9*  HCT 29.2* 29.5* 27.8*  MCV 98.6 98.7 94.9  PLT 126* 115* 92*   Medications:     allopurinol  300 mg Oral Daily   atorvastatin  20 mg Oral Daily   Chlorhexidine Gluconate Cloth  6 each Topical Daily   dexamethasone (DECADRON) injection  8 mg Intravenous Q8H   furosemide  80 mg Intravenous Once   gabapentin  300 mg Oral Daily   heparin  5,000 Units Subcutaneous Q8H   pantoprazole  40 mg Oral Daily   sodium chloride flush  10-40 mL Intracatheter Q12H   sodium zirconium cyclosilicate  10 g Oral BID   vancomycin variable dose per unstable renal function (pharmacist dosing)   Does not apply See admin instructions   Elmarie Shiley, MD 12/13/2020, 12:04 PM

## 2020-12-13 NOTE — Plan of Care (Signed)

## 2020-12-14 DIAGNOSIS — I10 Essential (primary) hypertension: Secondary | ICD-10-CM

## 2020-12-14 DIAGNOSIS — R6510 Systemic inflammatory response syndrome (SIRS) of non-infectious origin without acute organ dysfunction: Secondary | ICD-10-CM | POA: Diagnosis not present

## 2020-12-14 DIAGNOSIS — E883 Tumor lysis syndrome: Principal | ICD-10-CM

## 2020-12-14 DIAGNOSIS — D61818 Other pancytopenia: Secondary | ICD-10-CM

## 2020-12-14 DIAGNOSIS — I129 Hypertensive chronic kidney disease with stage 1 through stage 4 chronic kidney disease, or unspecified chronic kidney disease: Secondary | ICD-10-CM

## 2020-12-14 DIAGNOSIS — N189 Chronic kidney disease, unspecified: Secondary | ICD-10-CM

## 2020-12-14 LAB — CBC WITH DIFFERENTIAL/PLATELET
Abs Immature Granulocytes: 0.09 10*3/uL — ABNORMAL HIGH (ref 0.00–0.07)
Basophils Absolute: 0 10*3/uL (ref 0.0–0.1)
Basophils Relative: 0 %
Eosinophils Absolute: 0 10*3/uL (ref 0.0–0.5)
Eosinophils Relative: 0 %
HCT: 25.7 % — ABNORMAL LOW (ref 36.0–46.0)
Hemoglobin: 8 g/dL — ABNORMAL LOW (ref 12.0–15.0)
Immature Granulocytes: 5 %
Lymphocytes Relative: 24 %
Lymphs Abs: 0.4 10*3/uL — ABNORMAL LOW (ref 0.7–4.0)
MCH: 30.7 pg (ref 26.0–34.0)
MCHC: 31.1 g/dL (ref 30.0–36.0)
MCV: 98.5 fL (ref 80.0–100.0)
Monocytes Absolute: 0.1 10*3/uL (ref 0.1–1.0)
Monocytes Relative: 3 %
Neutro Abs: 1.1 10*3/uL — ABNORMAL LOW (ref 1.7–7.7)
Neutrophils Relative %: 68 %
Platelets: 68 10*3/uL — ABNORMAL LOW (ref 150–400)
RBC: 2.61 MIL/uL — ABNORMAL LOW (ref 3.87–5.11)
RDW: 16.2 % — ABNORMAL HIGH (ref 11.5–15.5)
WBC: 1.7 10*3/uL — ABNORMAL LOW (ref 4.0–10.5)
nRBC: 2.4 % — ABNORMAL HIGH (ref 0.0–0.2)

## 2020-12-14 LAB — GLUCOSE, CAPILLARY
Glucose-Capillary: 110 mg/dL — ABNORMAL HIGH (ref 70–99)
Glucose-Capillary: 111 mg/dL — ABNORMAL HIGH (ref 70–99)
Glucose-Capillary: 115 mg/dL — ABNORMAL HIGH (ref 70–99)
Glucose-Capillary: 116 mg/dL — ABNORMAL HIGH (ref 70–99)
Glucose-Capillary: 131 mg/dL — ABNORMAL HIGH (ref 70–99)
Glucose-Capillary: 141 mg/dL — ABNORMAL HIGH (ref 70–99)

## 2020-12-14 LAB — RENAL FUNCTION PANEL
Albumin: 1.9 g/dL — ABNORMAL LOW (ref 3.5–5.0)
Anion gap: 12 (ref 5–15)
BUN: 72 mg/dL — ABNORMAL HIGH (ref 8–23)
CO2: 20 mmol/L — ABNORMAL LOW (ref 22–32)
Calcium: 8.2 mg/dL — ABNORMAL LOW (ref 8.9–10.3)
Chloride: 103 mmol/L (ref 98–111)
Creatinine, Ser: 6.37 mg/dL — ABNORMAL HIGH (ref 0.44–1.00)
GFR, Estimated: 7 mL/min — ABNORMAL LOW (ref >60)
Glucose, Bld: 124 mg/dL — ABNORMAL HIGH (ref 70–99)
Phosphorus: 8.2 mg/dL — ABNORMAL HIGH (ref 2.5–4.6)
Potassium: 5.3 mmol/L — ABNORMAL HIGH (ref 3.5–5.1)
Sodium: 135 mmol/L (ref 135–145)

## 2020-12-14 LAB — URIC ACID: Uric Acid, Serum: 10 mg/dL — ABNORMAL HIGH (ref 2.5–7.1)

## 2020-12-14 LAB — PROCALCITONIN: Procalcitonin: 4.17 ng/mL

## 2020-12-14 LAB — VANCOMYCIN, RANDOM: Vancomycin Rm: 14

## 2020-12-14 MED ORDER — SODIUM CHLORIDE 0.9 % IV SOLN
6.0000 mg | Freq: Once | INTRAVENOUS | Status: AC
  Administered 2020-12-14: 6 mg via INTRAVENOUS
  Filled 2020-12-14: qty 4

## 2020-12-14 MED ORDER — ALLOPURINOL 100 MG PO TABS
200.0000 mg | ORAL_TABLET | Freq: Every day | ORAL | Status: DC
  Administered 2020-12-15: 200 mg via ORAL
  Filled 2020-12-14: qty 2

## 2020-12-14 MED ORDER — LANTHANUM CARBONATE 500 MG PO CHEW
1000.0000 mg | CHEWABLE_TABLET | Freq: Three times a day (TID) | ORAL | Status: DC
  Administered 2020-12-14 – 2020-12-15 (×3): 1000 mg via ORAL
  Filled 2020-12-14 (×4): qty 2

## 2020-12-14 MED ORDER — ALBUMIN HUMAN 25 % IV SOLN
12.5000 g | Freq: Once | INTRAVENOUS | Status: AC
  Administered 2020-12-14: 12.5 g via INTRAVENOUS
  Filled 2020-12-14: qty 50

## 2020-12-14 MED ORDER — FUROSEMIDE 10 MG/ML IJ SOLN
80.0000 mg | Freq: Once | INTRAMUSCULAR | Status: AC
  Administered 2020-12-14: 80 mg via INTRAVENOUS
  Filled 2020-12-14: qty 8

## 2020-12-14 NOTE — Progress Notes (Signed)
PROGRESS NOTE    Tracey Stevens  LNL:[id number] DOB: 16-Mar-1961 DOA: 12/11/2020 PCP: Bernerd Limbo, MD   Brief Narrative: Tracey Stevens is a 60 y.o. 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 Patient presented secondary to fever and generalized weakness. Symptoms concerning for SIRS with possible sepsis in setting of known ALL on immunotherapy. Blinatumomab held on admission. Empiric antibiotics started and cultures obtained. Attempts to transfer to [place] when a bed is available.   Assessment & Plan:   Principal Problem:   SIRS (systemic inflammatory response syndrome) (HCC) Active Problems:   CKD (chronic kidney disease) stage 5, GFR less than 15 ml/min (HCC)   ALL (acute lymphoblastic leukemia) (HCC)   AKI (acute kidney injury) ([place])   SIRS Possibly infectious source vs cytokine release syndrome (unlikely per oncology). Patient started on broad spectrum antibiotics with Vancomycin, Cefepime, Flagyl. Blood and urine cultures obtained and are pending. Blinatumomab discontinued on admission. Procalcitonin elevated. No source on urinalysis/CT chest. Urine culture with insignificant growth. Blood cultures with no growth to date -Follow-up blood (no growth x1 day) -Continue empiric Cefepime, Flagyl -Discontinue Vancomycin   AKI on CKD stage IV FSGS Baseline creatinine around 2.5 per primary oncologist. Recent Creatinine up to around 4 on 9/26. Creatinine of 5.01 on admission. Renal ultrasound significant for evidence of medical renal disease. Patient is making urine and has 500 mL of urine in the canister by her bedside this morning. Nephrology consulted. Creatinine worsened. Documented UOP of 415 over the last 24 hours -Nephrology recommendations: furosemide  Leukopenia Mediated by lymphocytopenia. Normal neutrophil count. -Daily CBC with differential  Thrombocytopenia Pancytopenia secondary to chemo In setting of acute illness in addition to chemo.  Oncology re-consulted  Tumor lysis syndrome -Oncology recommendations: rasburicase 6 mg -Trend uric acid   Primary hypertension Patient is on metoprolol succinate 25 mg daily as an outpatient which has been held secondary to SIRS. Blood pressure normotensive at this time.   ALL Patient is followed by Dr. Joan Mayans at [facility]. Currently on Blinatumomab therapy as of 9/9 which is held this admission; confirmed with primary oncologist.   Hyperlipidemia On Lipitor as an outpatient   GERD On omeprazole as an outpatient -Protonix while inpatient   Right lower lobe pulmonary nodule 10 mm in size. Stable over the past 3 months with recommendation for follow-up CT chest  in 6 months    DVT prophylaxis: SCDs Code Status:   Code Status: Full Code Family Communication: None at bedside Disposition Plan: Plan for transfer to [facility] when bed is available   Consultants:  Nephrology Oncology  Procedures:  None  Antimicrobials: Vancomycin Cefepime Flagyl    Subjective: No issues overnight.  Objective: Vitals:   12/13/20 2300 12/14/20 0050 12/14/20 0300 12/14/20 0759  BP: 136/74 131/82 130/86 129/76  Pulse: (!) 107 (!) 106 96 99  Resp: 20 20 20    Temp: 98.8 F (37.1 C) 98.6 F (37 C) 98.3 F (36.8 C) 98.4 F (36.9 C)  TempSrc: Oral Oral Oral   SpO2: 95% 94% 96% 92%  Weight:      Height:        Intake/Output Summary (Last 24 hours) at 12/14/2020 0823 Last data filed at 12/14/2020 0600 Gross per 24 hour  Intake 491.54 ml  Output 1750 ml  Net -1258.46 ml    Filed Weights   12/11/20 2033 12/11/20 2133 12/12/20 1612  Weight: (!) 146.1 kg (!) 149.7 kg (!) 156.1 kg  Examination:  General exam: Appears calm and comfortable Respiratory system: Clear to auscultation. Respiratory effort normal. Cardiovascular system: S1 & S2 heard, RRR. Gastrointestinal system: Abdomen is nondistended, soft and nontender. No organomegaly or masses felt. Normal bowel  sounds heard. Central nervous system: Alert and oriented. No focal neurological deficits. Musculoskeletal: No calf tenderness Skin: No cyanosis. No rashes Psychiatry: Judgement and insight appear normal. Mood & affect appropriate.     Data Reviewed: I have personally reviewed following labs and imaging studies  CBC Lab Results  Component Value Date   WBC 1.7 (L) 12/14/2020   RBC 2.61 (L) 12/14/2020   HGB 8.0 (L) 12/14/2020   HCT 25.7 (L) 12/14/2020   MCV 98.5 12/14/2020   MCH 30.7 12/14/2020   PLT 68 (L) 12/14/2020   MCHC 31.1 12/14/2020   RDW 16.2 (H) 12/14/2020   LYMPHSABS 0.4 (L) 12/14/2020   MONOABS 0.1 12/14/2020   EOSABS 0.0 12/14/2020   BASOSABS 0.0 29/51/8841     Last metabolic panel Lab Results  Component Value Date   NA 135 12/14/2020   K 5.3 (H) 12/14/2020   CL 103 12/14/2020   CO2 20 (L) 12/14/2020   BUN 72 (H) 12/14/2020   CREATININE 6.37 (H) 12/14/2020   GLUCOSE 124 (H) 12/14/2020   GFRNONAA 7 (L) 12/14/2020   GFRAA 30 (L) 01/10/2019   CALCIUM 8.2 (L) 12/14/2020   PHOS 8.2 (H) 12/14/2020   PROT 5.3 (L) 12/12/2020   ALBUMIN 1.9 (L) 12/14/2020   BILITOT 0.7 12/12/2020   ALKPHOS 47 12/12/2020   AST 21 12/12/2020   ALT 11 12/12/2020   ANIONGAP 12 12/14/2020    CBG (last 3)  Recent Labs    12/14/20 0001 12/14/20 0416 12/14/20 0809  GLUCAP 111* 131* 116*      GFR: Estimated Creatinine Clearance: 15.8 mL/min (A) (by C-G formula based on SCr of 6.37 mg/dL (H)).  Coagulation Profile: Recent Labs  Lab 12/11/20 2112 12/12/20 0403  INR 1.1 1.2     Recent Results (from the past 240 hour(s))  Blood Culture (routine x 2)     Status: None (Preliminary result)   Collection Time: 12/11/20 12:25 AM   Specimen: BLOOD  Result Value Ref Range Status   Specimen Description   Final    BLOOD RIGHT ANTECUBITAL Performed at [facility], [place] [address] [place], [place] [address]    Special Requests   Final    BOTTLES DRAWN AEROBIC AND  ANAEROBIC Blood Culture adequate volume Performed at [place] [address], [place], [place] [address]    Culture   Final    NO GROWTH 1 DAY Performed at [facility], [place] [address], [place], [place] [address]    Report Status PENDING  Incomplete  Resp Panel by RT-PCR (Flu A&B, Covid) Nasopharyngeal Swab     Status: None   Collection Time: 12/11/20  9:12 PM   Specimen: Nasopharyngeal Swab; Nasopharyngeal(NP) swabs in vial transport medium  Result Value Ref Range Status   SARS Coronavirus 2 by RT PCR NEGATIVE NEGATIVE Final    Comment: (NOTE) SARS-CoV-2 target nucleic acids are NOT DETECTED.  The SARS-CoV-2 RNA is generally detectable in upper respiratory specimens during the acute phase of infection. The lowest concentration of SARS-CoV-2 viral copies this assay can detect is 138 copies/mL. A negative result does not preclude SARS-Cov-2 infection and should not be used as the sole basis for treatment or other patient management decisions. A negative result may occur  with  improper specimen collection/handling, submission of specimen other than nasopharyngeal swab, presence of viral mutation(s) within the areas targeted by this assay, and inadequate number of viral copies(<138 copies/mL). A negative result must be combined with clinical observations, patient history, and epidemiological information. The expected result is Negative.  Fact Sheet for Patients:  EntrepreneurPulse.com.au  Fact Sheet for Healthcare Providers:  IncredibleEmployment.be  This test is no t yet approved or cleared by the [place] FDA and  has been authorized for detection and/or diagnosis of SARS-CoV-2 by FDA under an Emergency Use Authorization (EUA). This EUA will remain  in effect (meaning this test can be used) for the duration of the COVID-19 declaration under Section 564(b)(1) of the Act, 21 U.S.C.section 360bbb-3(b)(1), unless  the authorization is terminated  or revoked sooner.       Influenza A by PCR NEGATIVE NEGATIVE Final   Influenza B by PCR NEGATIVE NEGATIVE Final    Comment: (NOTE) The Xpert Xpress SARS-CoV-2/FLU/RSV plus assay is intended as an aid in the diagnosis of influenza from Nasopharyngeal swab specimens and should not be used as a sole basis for treatment. Nasal washings and aspirates are unacceptable for Xpert Xpress SARS-CoV-2/FLU/RSV testing.  Fact Sheet for Patients: EntrepreneurPulse.com.au  Fact Sheet for Healthcare Providers: IncredibleEmployment.be  This test is not yet approved or cleared by the [place] FDA and has been authorized for detection and/or diagnosis of SARS-CoV-2 by FDA under an Emergency Use Authorization (EUA). This EUA will remain in effect (meaning this test can be used) for the duration of the COVID-19 declaration under Section 564(b)(1) of the Act, 21 U.S.C. section 360bbb-3(b)(1), unless the authorization is terminated or revoked.  Performed at [facility], [place] [address], [place], [place] [address]   Blood Culture (routine x 2)     Status: None (Preliminary result)   Collection Time: 12/11/20  9:12 PM   Specimen: BLOOD  Result Value Ref Range Status   Specimen Description   Final    BLOOD LEFT ANTECUBITAL Performed at [facility], [place] [address] [place], [place] [address]    Special Requests   Final    BOTTLES DRAWN AEROBIC AND ANAEROBIC Blood Culture adequate volume Performed at [place] [address], [place], [place] [address]    Culture   Final    NO GROWTH 1 DAY Performed at [facility], [place] [address] [place], [place] [address]    Report Status PENDING  Incomplete  Urine Culture     Status: Abnormal   Collection Time: 12/11/20  9:29 PM   Specimen: Urine, Clean Catch  Result Value Ref Range Status   Specimen Description   Final     URINE, CLEAN CATCH Performed at [facility], [place] [address], [place], [place] [address]    Special Requests   Final    NONE Performed at [facility], [place] [address], [place], [place] [address]    Culture (A)  Final    <10,000 COLONIES/mL INSIGNIFICANT GROWTH Performed at [place] [address] [place],  [address]    Report Status 12/12/2020 FINAL  Final         Radiology Studies: No results found.      Scheduled Meds:  allopurinol  300 mg Oral Daily   atorvastatin  20 mg Oral Daily   Chlorhexidine Gluconate Cloth  6 each Topical Daily   dexamethasone (DECADRON) injection  8 mg Intravenous Q8H  gabapentin  300 mg Oral Daily   heparin  5,000 Units Subcutaneous Q8H   pantoprazole  40 mg Oral Daily   sodium chloride flush  10-40 mL Intracatheter Q12H   sodium zirconium cyclosilicate  10 g Oral TID   vancomycin variable dose per unstable renal function (pharmacist dosing)   Does not apply See admin instructions   Continuous Infusions:  ceFEPime (MAXIPIME) IV 2 g (12/13/20 2111)   metronidazole 500 mg (12/14/20 0734)     LOS: 2 days     Cordelia Poche, MD [facility] 12/14/2020, 8:23 AM  If 7PM-7AM, please contact night-coverage [email or web address]

## 2020-12-14 NOTE — Progress Notes (Signed)
   12/13/20 2050  Assess: MEWS Score  Temp 98.5 F (36.9 C)  BP 120/74  Pulse Rate (!) 113  Resp 20  Level of Consciousness Alert  SpO2 95 %  O2 Device Room Air  Assess: MEWS Score  MEWS Temp 0  MEWS Systolic 0  MEWS Pulse 2  MEWS RR 0  MEWS LOC 0  MEWS Score 2  MEWS Score Color Yellow  Assess: if the MEWS score is Yellow or Red  Were vital signs taken at a resting state? Yes  Focused Assessment No change from prior assessment  Does the patient meet 2 or more of the SIRS criteria? No  MEWS guidelines implemented *See Row Information* Yes  Treat  MEWS Interventions Administered scheduled meds/treatments;Other (Comment)  Take Vital Signs  Increase Vital Sign Frequency  Yellow: Q 2hr X 2 then Q 4hr X 2, if remains yellow, continue Q 4hrs  Notify: Charge Nurse/RN  Name of Charge Nurse/RN Notified Duffy Rhody, RN  Date Charge Nurse/RN Notified 12/13/20  Time Charge Nurse/RN Notified 2053  Document  Progress note created (see row info) Yes  Assess: SIRS CRITERIA  SIRS Temperature  0  SIRS Pulse 1  SIRS Respirations  0  SIRS WBC 0  SIRS Score Sum  1

## 2020-12-14 NOTE — Progress Notes (Signed)
Patient ID: Tracey Stevens, female   DOB: 09-06-1960, 60 y.o.   MRN: [id number] [facility] Progress Note   Assessment/ Plan:   1.  Acute kidney injury on chronic kidney disease stage IV: Underlying advancing chronic kidney disease stage IV secondary to FSGS with recent AKI associated with TLS/chemotherapy.  Currently suspect ATN associated with SIRS versus tumor lysis based on rising uric acid level for which she had rasburicase ordered earlier by oncology.  Not felt to have cytokine release syndrome and corticosteroids discontinued.  Overnight nonoliguric with decent response to furosemide which I will repeat today.  Hyperkalemia successfully managed medically and she does not have any acute indications for dialysis at this time.  Appears that she might transfer to [facility] today. 2.  Systemic inflammatory response syndrome: Sepsis of unclear etiology and started on empiric broad-spectrum antibiotic therapy with cefepime, vancomycin and Flagyl with urine culture showing insignificant growth and blood cultures negative to date.  Chest x-ray without any evidence of pneumonia. 3.  Non-anion gap metabolic acidosis: Mild and secondary to advancing chronic kidney disease. 4.  Hypertension: Blood pressures within acceptable range while holding ARB and with ongoing diuresis. 5.  Hyperkalemia: Secondary to acute kidney injury, treated with Lokelma and trying furosemide. 6.  CKD-MBD: Rising phosphorus level noted with declining renal function/tumor lysis, will begin phosphorus binder.  Subjective:   Reports to be feeling fair, denies any chest pain or shortness of breath   Objective:   BP 129/76   Pulse 99   Temp 98.4 F (36.9 C)   Resp 18   Ht 6\' 1"  (1.854 m)   Wt (!) 156.1 kg   SpO2 92%   BMI 45.41 kg/m   Intake/Output Summary (Last 24 hours) at 12/14/2020 1031 Last data filed at 12/14/2020 0600 Gross per 24 hour  Intake 491.54 ml  Output 1750 ml  Net -1258.46 ml   Weight  change:   Physical Exam: Gen: Appears comfortable sitting up in bed, not in distress CVS: Pulse regular rhythm, normal rate, S1 and S2 normal Resp: Diminished breath sounds over bases-poor inspiratory effort.  No distinct rales/wheeze Abd: Soft, obese, nontender, bowel sounds normal Ext: 2-3+ lower extremity edema with 1-2+ anasarca  Imaging: No results found.  Labs: BMET Recent Labs  Lab 12/11/20 2112 12/12/20 0403 12/13/20 0346 12/13/20 1200 12/14/20 0340  NA 138 140 137 136 135  K 4.3 4.8 5.6* 5.7* 5.3*  CL 105 111 106 104 103  CO2 23 21* 22 21* 20*  GLUCOSE 122* 123* 134* 131* 124*  BUN 31* 33* 51* 52* 72*  CREATININE 5.01* 5.06* 5.90* 5.76* 6.37*  CALCIUM 8.5* 8.7* 8.3* 8.3* 8.2*  PHOS  --   --  6.9*  --  8.2*   CBC Recent Labs  Lab 12/11/20 2112 12/12/20 0403 12/13/20 0346 12/14/20 0340  WBC 4.6 4.1 2.5* 1.7*  NEUTROABS 2.2  --  1.8 1.1*  HGB 9.0* 9.2* 8.9* 8.0*  HCT 29.2* 29.5* 27.8* 25.7*  MCV 98.6 98.7 94.9 98.5  PLT 126* 115* 92* 68*   Medications:     allopurinol  300 mg Oral Daily   atorvastatin  20 mg Oral Daily   Chlorhexidine Gluconate Cloth  6 each Topical Daily   furosemide  80 mg Intravenous Once   gabapentin  300 mg Oral Daily   heparin  5,000 Units Subcutaneous Q8H   pantoprazole  40 mg Oral Daily   sodium chloride flush  10-40 mL Intracatheter Q12H   sodium  zirconium cyclosilicate  10 g Oral TID   vancomycin variable dose per unstable renal function (pharmacist dosing)   Does not apply See admin instructions   Elmarie Shiley, MD 12/14/2020, 10:31 AM

## 2020-12-14 NOTE — Discharge Summary (Signed)
Physician Discharge Summary  Tracey Stevens UMP:[id number] DOB: Aug 10, 1960 DOA: 12/11/2020  PCP: Bernerd Limbo, MD  Admit date: 12/11/2020 Discharge date: 12/15/2020  Admitted From: Home Disposition: [facility]  Discharge Condition: Guarded CODE STATUS: Full code  Brief/Interim Summary:  Admission HPI written by Etta Quill, DO   HPI: Tracey Stevens is a 60 y.o. 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  Pt recently underwent induction chemotherapy with Blincyto during admit at [facility] 9/9-9/19.   Renal function (creat) peaked at 3.2 during that admit, was 2.8 on DC 2.0 baseline).   Pt had low grade fevers during that admit attributed to [facility].  Cultures negative.   Since admission, Renal function has been slowly deteriorating, creat was 4.5 in office on 9/26.  Lasix stopped, given 500cc bolus, planned to repeat creat on 9/29 and admit pt to hospital at [facility] if continuing to worsen.   Today pt presents to the ED with c/o fever, generalized weakness.   Tm at home 103.2.  Generalized body aches.   Has had a persistent, dry cough for "a while" that has been ongoing, unchanged.   No CP, no N/V/D, no dysuria.   Hospital course:  SIRS Possibly infectious source with initial concern for possible cytokine release syndrome (unlikely per oncology). No source on urinalysis/CT chest.  Patient started on broad spectrum antibiotics with Vancomycin, Cefepime, Flagyl in addition to Decadron IV. Blood cultures no growth to date. Urine cultures with insignificant growth. Blinatumomab discontinued on admission. Procalcitonin elevated but in setting of active cancer treatment. Vancomycin discontinued secondary to negative blood cultures and worsening creatinine. No source identified, so plan to watch off of antibiotics.   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 Recent Creatinine up  to around 4 on 9/26. Creatinine of 5.01 on admission. Renal ultrasound significant for evidence of medical renal disease. Nephrology consulted. Creatinine worsened with IV fluids and trial of lasix initiated. Uric acid elevated concerning for tumor lysis syndrome. Nephrology concerned patient is headed towards need for intermittent hemodialysis.  Documented UOP of 600 mL over the last 24 hours and net positive 684 mL for the admission. Creatinine of 6.15 today, down slightly from peak of 6.37 on 10/1.   Thrombocytopenia Pancytopenia secondary to chemo In setting of acute illness in addition to chemo. Neutrophil count down to 700. Hemoglobin and platelets drifting as well. Discussed with hematology and would consider transfusion with irradiated blood products, but can wait until transfer to [facility]. No currently bleeding.   Tumor lysis syndrome Uric acid of 8.9 trended up to 10. Oncology recommended rasburicase 6 mg with repeat uric acid of 4.7.   Primary hypertension Patient is on metoprolol succinate 25 mg daily as an outpatient which has been held secondary to SIRS. Blood pressure normotensive at this time.   ALL Patient is followed by Dr. Joan Mayans at [facility]. Currently on Blinatumomab therapy as of 9/9 which is held this admission; confirmed with primary oncologist.   Hyperlipidemia On Lipitor as an outpatient   GERD On omeprazole as an outpatient. Protonix while prescribed while inpatient.   Right lower lobe pulmonary nodule 10 mm in size. Stable over the past 3 months with recommendation for follow-up CT chest  in 6 months  Discharge Diagnoses:  Principal Problem:   SIRS (systemic inflammatory response syndrome) (HCC) Active Problems:   CKD (chronic kidney disease) stage 5, GFR less than 15  ml/min ([place])   ALL (acute lymphoblastic leukemia) ([place])   AKI (acute kidney injury) ([place])    Discharge Instructions   Allergies as of 12/15/2020       Reactions   Amlodipine  Swelling   Peripheral edema, ankles swell Peripheral edema        Medication List     STOP taking these medications    allopurinol 300 MG tablet Commonly known as: ZYLOPRIM       TAKE these medications    atorvastatin 20 MG tablet Commonly known as: LIPITOR Take 20 mg by mouth daily.   cholecalciferol 25 MCG (1000 UNIT) tablet Commonly known as: VITAMIN D Take 4,000 Units by mouth daily.   gabapentin 300 MG capsule Commonly known as: NEURONTIN Take 300 mg by mouth daily.   meclizine 25 MG tablet Commonly known as: ANTIVERT Take 25 mg by mouth 2 (two) times daily as needed for dizziness.   methocarbamol 500 MG tablet Commonly known as: ROBAXIN Take 500 mg by mouth daily as needed for muscle spasms.   Metoprolol Succinate 25 MG Cs24 Take 25 tablets by mouth daily.   omeprazole 40 MG capsule Commonly known as: PRILOSEC Take 40 mg by mouth daily.   ondansetron 8 MG tablet Commonly known as: ZOFRAN Take 8 mg by mouth every 8 (eight) hours as needed for vomiting or nausea.        Allergies  Allergen Reactions   Amlodipine Swelling    Peripheral edema, ankles swell Peripheral edema     Consultations: Hematology/oncology Nephrology   Procedures/Studies: CT Chest Wo Contrast  Result Date: 12/11/2020 CLINICAL DATA:  Persistent cough, fever EXAM: CT CHEST WITHOUT CONTRAST TECHNIQUE: Multidetector CT imaging of the chest was performed following the standard protocol without IV contrast. COMPARISON:  Chest radiograph dated 12/11/2020. Partial comparison to CT abdomen pelvis dated 08/30/2020. FINDINGS: Cardiovascular: Heart is normal in size.  No pericardial effusion. No evidence of thoracic aortic aneurysm. Right chest port terminates at the cavoatrial junction. Mediastinum/Nodes: No suspicious mediastinal lymphadenopathy. Visualized thyroid is unremarkable. Lungs/Pleura: Evaluation lung parenchyma is constrained by respiratory motion. Within that constraint,  is a 10 mm right lower lobe nodule (series 7/image 98), unchanged in retrospect from prior CT abdomen/pelvis Mild linear scarring/atelectasis in the inferior lingula and medial right lower lobe. Mild pleural thickening in the medial right lower hemithorax. No pleural effusion or pneumothorax. Upper Abdomen: Visualized upper abdomen is notable for low-density thickening of the bilateral adrenal glands, favoring benign adrenal adenomas. Left upper pole renal cysts which are better evaluated on prior CT. Musculoskeletal: Degenerative changes of the visualized thoracolumbar spine. Moderate compression fracture deformity at T12, unchanged. IMPRESSION: No evidence of acute cardiopulmonary disease. 10 mm right lower lobe pulmonary nodule. Three month stability has been demonstrated. Consider follow-up CT chest in 6 months. This recommendation follows the consensus statement: Guidelines for Management of Small Pulmonary Nodules Detected on CT Images: From the Fleischner Society 2017; Radiology 2017; 284:228-243. Electronically Signed   By: Julian Hy M.D.   On: 12/11/2020 23:49   US RENAL  Result Date: 12/12/2020 CLINICAL DATA:  Acute kidney injury EXAM: RENAL / URINARY TRACT ULTRASOUND COMPLETE COMPARISON:  CT abdomen/pelvis dated 08/30/2020 FINDINGS: Right Kidney: Renal measurements: 10.6 x 4.9 x 4.5 cm = volume: 122 mL. Echogenic renal parenchyma. Scattered simple right renal cysts measuring up to 1.7 cm. No hydronephrosis. Left Kidney: Renal measurements: 12.3 x 6.1 x 5.2 cm = volume: 203 mL. Echogenic renal parenchyma. Scattered left renal cysts, including dominant  simple cysts measuring 4.9 and 3.4 cm. No hydronephrosis. Bladder: Appears normal for degree of bladder distention. Other: Left pleural effusion. IMPRESSION: Echogenic renal parenchyma, suggesting medical renal disease. No hydronephrosis. Bilateral renal cysts, measuring up to 4.9 cm on the left, simple. Electronically Signed   By: Julian Hy M.D.   On: 12/12/2020 02:19   DG Chest Port 1 View  Result Date: 12/11/2020 CLINICAL DATA:  Questionable sepsis.  History of ALL. EXAM: PORTABLE CHEST 1 VIEW COMPARISON:  CT abdomen pelvis 08/30/2020 urogram 10/17/2015, CT FINDINGS: Right chest wall Port-A-Cath with tip overlying the superior cavoatrial junction. The heart and mediastinal contours are within normal limits. Question thickened right paratracheal stripe. Redemonstration of a right base pulmonary nodules measuring approximately 1.2 cm. No focal consolidation. No pulmonary edema. No pleural effusion. No pneumothorax. No acute osseous abnormality. IMPRESSION: 1. Question thickened right paratracheal stripe. Consider CT chest with Intravenous contrast if clinically indicated. 2. A persistent pericentimeter right base pulmonary nodule better evaluated on CT abdomen pelvis 08/30/2020. 3. Otherwise no acute cardiopulmonary abnormality. Electronically Signed   By: Iven Finn M.D.   On: 12/11/2020 21:07     Subjective: No issues overnight. Afebrile. Cough is improved with  Robitussin prn.  Discharge Exam: Vitals:   12/15/20 0540 12/15/20 1000  BP: 131/85 (!) 144/68  Pulse: 67 61  Resp: 20 12  Temp: 97.9 F (36.6 C) (!) 97.5 F (36.4 C)  SpO2: 94% 96%   Vitals:   12/14/20 1352 12/14/20 1900 12/15/20 0540 12/15/20 1000  BP: (!) 141/72 130/80 131/85 (!) 144/68  Pulse: (!) 108 90 67 61  Resp: 20 20 20 12   Temp: 99 F (37.2 C) 98 F (36.7 C) 97.9 F (36.6 C) (!) 97.5 F (36.4 C)  TempSrc: Oral Oral Oral Oral  SpO2: 96% 94% 94% 96%  Weight:      Height:        General exam: Appears calm and comfortable Respiratory system: Clear to auscultation. Respiratory effort normal. Cardiovascular system: S1 & S2 heard, RRR. Gastrointestinal system: Abdomen is nondistended, soft and nontender. No organomegaly or masses felt. Normal bowel sounds heard. Central nervous system: Alert and oriented. No focal neurological  deficits. Musculoskeletal: No calf tenderness Skin: No cyanosis. Psychiatry: Judgement and insight appear normal. Mood & affect appropriate.    The results of significant diagnostics from this hospitalization (including imaging, microbiology, ancillary and laboratory) are listed below for reference.     Microbiology: Recent Results (from the past 240 hour(s))  Blood Culture (routine x 2)     Status: None (Preliminary result)   Collection Time: 12/11/20 12:25 AM   Specimen: BLOOD  Result Value Ref Range Status   Specimen Description   Final    BLOOD RIGHT ANTECUBITAL Performed at [facility], [place] [address] [place], [place] [address]    Special Requests   Final    BOTTLES DRAWN AEROBIC AND ANAEROBIC Blood Culture adequate volume Performed at [place] [address] [place], [place] [address]    Culture   Final    NO GROWTH 3 DAYS Performed at [facility], [place] [address], [place], [place] [address]    Report Status PENDING  Incomplete  Resp Panel by RT-PCR (Flu A&B, Covid) Nasopharyngeal Swab     Status: None   Collection Time: 12/11/20  9:12 PM   Specimen: Nasopharyngeal Swab; Nasopharyngeal(NP) swabs in vial transport medium  Result Value Ref Range Status   SARS Coronavirus 2  by RT PCR NEGATIVE NEGATIVE Final    Comment: (NOTE) SARS-CoV-2 target nucleic acids are NOT DETECTED.  The SARS-CoV-2 RNA is generally detectable in upper respiratory specimens during the acute phase of infection. The lowest concentration of SARS-CoV-2 viral copies this assay can detect is 138 copies/mL. A negative result does not preclude SARS-Cov-2 infection and should not be used as the sole basis for treatment or other patient management decisions. A negative result may occur with  improper specimen collection/handling, submission of specimen other than nasopharyngeal swab, presence of viral mutation(s) within the areas targeted by this assay, and  inadequate number of viral copies(<138 copies/mL). A negative result must be combined with clinical observations, patient history, and epidemiological information. The expected result is Negative.  Fact Sheet for Patients:  EntrepreneurPulse.com.au  Fact Sheet for Healthcare Providers:  IncredibleEmployment.be  This test is no t yet approved or cleared by the [place] FDA and  has been authorized for detection and/or diagnosis of SARS-CoV-2 by FDA under an Emergency Use Authorization (EUA). This EUA will remain  in effect (meaning this test can be used) for the duration of the COVID-19 declaration under Section 564(b)(1) of the Act, 21 U.S.C.section 360bbb-3(b)(1), unless the authorization is terminated  or revoked sooner.       Influenza A by PCR NEGATIVE NEGATIVE Final   Influenza B by PCR NEGATIVE NEGATIVE Final    Comment: (NOTE) The Xpert Xpress SARS-CoV-2/FLU/RSV plus assay is intended as an aid in the diagnosis of influenza from Nasopharyngeal swab specimens and should not be used as a sole basis for treatment. Nasal washings and aspirates are unacceptable for Xpert Xpress SARS-CoV-2/FLU/RSV testing.  Fact Sheet for Patients: EntrepreneurPulse.com.au  Fact Sheet for Healthcare Providers: IncredibleEmployment.be  This test is not yet approved or cleared by the [place] FDA and has been authorized for detection and/or diagnosis of SARS-CoV-2 by FDA under an Emergency Use Authorization (EUA). This EUA will remain in effect (meaning this test can be used) for the duration of the COVID-19 declaration under Section 564(b)(1) of the Act, 21 U.S.C. section 360bbb-3(b)(1), unless the authorization is terminated or revoked.  Performed at [facility], [place] [address] [place], [place] [address]   Blood Culture (routine x 2)     Status: None (Preliminary result)   Collection  Time: 12/11/20  9:12 PM   Specimen: BLOOD  Result Value Ref Range Status   Specimen Description   Final    BLOOD LEFT ANTECUBITAL Performed at [facility], [place] [address], [place], [place] [address]    Special Requests   Final    BOTTLES DRAWN AEROBIC AND ANAEROBIC Blood Culture adequate volume Performed at [place] [address], [place], [place] [address]    Culture   Final    NO GROWTH 3 DAYS Performed at [facility], [place] [address], [place], [place] [address]    Report Status PENDING  Incomplete  Urine Culture     Status: Abnormal   Collection Time: 12/11/20  9:29 PM   Specimen: Urine, Clean Catch  Result Value Ref Range Status   Specimen Description   Final    URINE, CLEAN CATCH Performed at [facility], [place] [address] [place], [place] [address]    Special Requests   Final    NONE Performed at [facility], [place] [address], [place], [place] [address]    Culture (A)  Final    <10,000 COLONIES/mL INSIGNIFICANT  GROWTH Performed at [facility], [place] [address] [place], [place] [address]    Report Status 12/12/2020 FINAL  Final     Labs: BNP (last 3 results) Recent Labs    12/11/20 2113  BNP 242.3*   Basic Metabolic Panel: Recent Labs  Lab 12/12/20 0403 12/13/20 0346 12/13/20 1200 12/14/20 0340 12/15/20 0314  NA 140 137 136 135 141  K 4.8 5.6* 5.7* 5.3* 4.7  CL 111 106 104 103 107  CO2 21* 22 21* 20* 21*  GLUCOSE 123* 134* 131* 124* 106*  BUN 33* 51* 52* 72* 85*  CREATININE 5.06* 5.90* 5.76* 6.37* 6.15*  CALCIUM 8.7* 8.3* 8.3* 8.2* 8.7*  PHOS  --  6.9*  --  8.2* 9.7*   Liver Function Tests: Recent Labs  Lab 12/11/20 2112 12/12/20 0403 12/13/20 0346 12/14/20 0340 12/15/20 0314  AST 21 21  --   --   --   ALT 12 11  --   --   --   ALKPHOS 54 47  --   --   --   BILITOT 0.5 0.7  --   --   --   PROT 5.5* 5.3*  --   --   --   ALBUMIN 2.5* 2.4* 2.1* 1.9* 2.0*   No  results for input(s): LIPASE, AMYLASE in the last 168 hours. No results for input(s): AMMONIA in the last 168 hours. CBC: Recent Labs  Lab 12/11/20 2112 12/12/20 0403 12/13/20 0346 12/14/20 0340 12/15/20 0314  WBC 4.6 4.1 2.5* 1.7* 1.6*  NEUTROABS 2.2  --  1.8 1.1* 0.7*  HGB 9.0* 9.2* 8.9* 8.0* 7.6*  HCT 29.2* 29.5* 27.8* 25.7* 24.1*  MCV 98.6 98.7 94.9 98.5 98.8  PLT 126* 115* 92* 68* 52*   Cardiac Enzymes: Recent Labs  Lab 12/13/20 1200  CKTOTAL 49   BNP: Invalid input(s): POCBNP CBG: Recent Labs  Lab 12/14/20 1142 12/14/20 1618 12/14/20 1950 12/15/20 0025 12/15/20 0733  GLUCAP 110* 141* 115* 119* 78   D-Dimer No results for input(s): DDIMER in the last 72 hours. Hgb A1c No results for input(s): HGBA1C in the last 72 hours. Lipid Profile No results for input(s): CHOL, HDL, LDLCALC, TRIG, CHOLHDL, LDLDIRECT in the last 72 hours. Thyroid function studies No results for input(s): TSH, T4TOTAL, T3FREE, THYROIDAB in the last 72 hours.  Invalid input(s): FREET3 Anemia work up No results for input(s): VITAMINB12, FOLATE, FERRITIN, TIBC, IRON, RETICCTPCT in the last 72 hours. Urinalysis    Component Value Date/Time   COLORURINE YELLOW (A) 12/11/2020 2129   APPEARANCEUR CLEAR (A) 12/11/2020 2129   LABSPEC 1.015 12/11/2020 2129   PHURINE 6.0 12/11/2020 2129   GLUCOSEU NEGATIVE 12/11/2020 2129   HGBUR MODERATE (A) 12/11/2020 2129   BILIRUBINUR NEGATIVE 12/11/2020 2129   KETONESUR NEGATIVE 12/11/2020 2129   PROTEINUR >300 (A) 12/11/2020 2129   NITRITE NEGATIVE 12/11/2020 2129   LEUKOCYTESUR NEGATIVE 12/11/2020 2129   Sepsis Labs Invalid input(s): PROCALCITONIN,  WBC,  LACTICIDVEN Microbiology Recent Results (from the past 240 hour(s))  Blood Culture (routine x 2)     Status: None (Preliminary result)   Collection Time: 12/11/20 12:25 AM   Specimen: BLOOD  Result Value Ref Range Status   Specimen Description   Final    BLOOD RIGHT ANTECUBITAL Performed at  [facility], [place] [address], [place], [place] [address]    Special Requests   Final    BOTTLES DRAWN AEROBIC AND ANAEROBIC Blood Culture adequate volume Performed at [facility]  [place] [address], [place], [place] [address]    Culture   Final    NO GROWTH 3 DAYS Performed at [facility], [place] [address], [place], [place] [address]    Report Status PENDING  Incomplete  Resp Panel by RT-PCR (Flu A&B, Covid) Nasopharyngeal Swab     Status: None   Collection Time: 12/11/20  9:12 PM   Specimen: Nasopharyngeal Swab; Nasopharyngeal(NP) swabs in vial transport medium  Result Value Ref Range Status   SARS Coronavirus 2 by RT PCR NEGATIVE NEGATIVE Final    Comment: (NOTE) SARS-CoV-2 target nucleic acids are NOT DETECTED.  The SARS-CoV-2 RNA is generally detectable in upper respiratory specimens during the acute phase of infection. The lowest concentration of SARS-CoV-2 viral copies this assay can detect is 138 copies/mL. A negative result does not preclude SARS-Cov-2 infection and should not be used as the sole basis for treatment or other patient management decisions. A negative result may occur with  improper specimen collection/handling, submission of specimen other than nasopharyngeal swab, presence of viral mutation(s) within the areas targeted by this assay, and inadequate number of viral copies(<138 copies/mL). A negative result must be combined with clinical observations, patient history, and epidemiological information. The expected result is Negative.  Fact Sheet for Patients:  EntrepreneurPulse.com.au  Fact Sheet for Healthcare Providers:  IncredibleEmployment.be  This test is no t yet approved or cleared by the [place] FDA and  has been authorized for detection and/or diagnosis of SARS-CoV-2 by FDA under an Emergency Use Authorization (EUA). This EUA will remain  in effect (meaning this test can be  used) for the duration of the COVID-19 declaration under Section 564(b)(1) of the Act, 21 U.S.C.section 360bbb-3(b)(1), unless the authorization is terminated  or revoked sooner.       Influenza A by PCR NEGATIVE NEGATIVE Final   Influenza B by PCR NEGATIVE NEGATIVE Final    Comment: (NOTE) The Xpert Xpress SARS-CoV-2/FLU/RSV plus assay is intended as an aid in the diagnosis of influenza from Nasopharyngeal swab specimens and should not be used as a sole basis for treatment. Nasal washings and aspirates are unacceptable for Xpert Xpress SARS-CoV-2/FLU/RSV testing.  Fact Sheet for Patients: EntrepreneurPulse.com.au  Fact Sheet for Healthcare Providers: IncredibleEmployment.be  This test is not yet approved or cleared by the [place] FDA and has been authorized for detection and/or diagnosis of SARS-CoV-2 by FDA under an Emergency Use Authorization (EUA). This EUA will remain in effect (meaning this test can be used) for the duration of the COVID-19 declaration under Section 564(b)(1) of the Act, 21 U.S.C. section 360bbb-3(b)(1), unless the authorization is terminated or revoked.  Performed at [facility], [place] [address], [place], [place] [address]   Blood Culture (routine x 2)     Status: None (Preliminary result)   Collection Time: 12/11/20  9:12 PM   Specimen: BLOOD  Result Value Ref Range Status   Specimen Description   Final    BLOOD LEFT ANTECUBITAL Performed at [facility], [place] [address], [place], [place] [address]    Special Requests   Final    BOTTLES DRAWN AEROBIC AND ANAEROBIC Blood Culture adequate volume Performed at [place] [address] [place], [place] [address]    Culture   Final    NO GROWTH 3 DAYS Performed at [facility], [place] [address], [place],  [address]    Report Status PENDING  Incomplete  Urine Culture  Status: Abnormal   Collection  Time: 12/11/20  9:29 PM   Specimen: Urine, Clean Catch  Result Value Ref Range Status   Specimen Description   Final    URINE, CLEAN CATCH Performed at [facility], [place] [address] [place], [place] [address]    Special Requests   Final    NONE Performed at [facility], [place] [address], [place], [place] [address]    Culture (A)  Final    <10,000 COLONIES/mL INSIGNIFICANT GROWTH Performed at [place] [address], [place], [place] [address]    Report Status 12/12/2020 FINAL  Final     Time coordinating discharge: 35 minutes  SIGNED:   Cordelia Poche, MD [facility] 12/15/2020, 11:02 AM

## 2020-12-14 NOTE — Progress Notes (Signed)
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 She is on chemo Blincyto. She came into our emergency room 3 days ago for recurrent fever.  She was admitted for further management.  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  She has developed worsening renal function since admission.  ID work-up has been negative, she has not had a recurrent fever in past 2 days.  She overall feels stable, no pain or other complains.  She has also developed pancytopenia.   Objective:  Vitals:   12/14/20 0759 12/14/20 1352  BP: 129/76 (!) 141/72  Pulse: 99 (!) 108  Resp: 18 20  Temp: 98.4 F (36.9 C) 99 F (37.2 C)  SpO2: 92% 96%    Body mass index is 45.41 kg/m.  Intake/Output Summary (Last 24 hours) at 12/14/2020 1519 Last data filed at 12/14/2020 0600 Gross per 24 hour  Intake 491.54 ml  Output 1450 ml  Net -958.46 ml     Sclerae unicteric  Oropharynx clear  No peripheral adenopathy  Lungs clear -- no rales or rhonchi  Heart regular rate and rhythm  Abdomen benign  MSK no focal spinal tenderness, no peripheral edema  Neuro nonfocal   CBG (last 3)  Recent Labs    12/14/20 0416 12/14/20 0809 12/14/20 1142  GLUCAP 131* 116* 110*     Labs:  Urine Studies No results for input(s): UHGB, CRYS in the last 72 hours.  Invalid input(s): UACOL, UAPR, USPG, UPH, UTP, UGL, UKET, UBIL, UNIT, UROB, [place], UEPI, UWBC, [place], [place], [place], [place], [place]  Basic Metabolic Panel: Recent Labs  Lab 12/11/20 2112 12/12/20 0403 12/13/20 0346 12/13/20 1200 12/14/20 0340  NA 138 140 137 136 135  K 4.3 4.8 5.6* 5.7* 5.3*  CL 105 111 106 104 103  CO2 23 21* 22 21* 20*  GLUCOSE 122* 123* 134* 131* 124*  BUN 31* 33* 51* 52* 72*  CREATININE  5.01* 5.06* 5.90* 5.76* 6.37*  CALCIUM 8.5* 8.7* 8.3* 8.3* 8.2*  PHOS  --   --  6.9*  --  8.2*   GFR Estimated Creatinine Clearance: 15.8 mL/min (A) (by C-G formula based on SCr of 6.37 mg/dL (H)). Liver Function Tests: Recent Labs  Lab 12/11/20 2112 12/12/20 0403 12/13/20 0346 12/14/20 0340  AST 21 21  --   --   ALT 12 11  --   --   ALKPHOS 54 47  --   --   BILITOT 0.5 0.7  --   --   PROT 5.5* 5.3*  --   --   ALBUMIN 2.5* 2.4* 2.1* 1.9*   No results for input(s): LIPASE, AMYLASE in the last 168 hours. No results for input(s): AMMONIA in the last 168 hours. Coagulation profile Recent Labs  Lab 12/11/20 2112 12/12/20 0403  INR 1.1 1.2    CBC: Recent Labs  Lab 12/11/20 2112 12/12/20 0403 12/13/20 0346 12/14/20 0340  WBC 4.6 4.1 2.5* 1.7*  NEUTROABS 2.2  --  1.8 1.1*  HGB 9.0* 9.2* 8.9* 8.0*  HCT 29.2* 29.5* 27.8* 25.7*  MCV 98.6 98.7 94.9 98.5  PLT 126* 115* 92* 68*   Cardiac Enzymes: Recent Labs  Lab 12/13/20 1200  CKTOTAL 49   BNP: Invalid input(s):  POCBNP CBG: Recent Labs  Lab 12/13/20 2045 12/14/20 0001 12/14/20 0416 12/14/20 0809 12/14/20 1142  GLUCAP 148* 111* 131* 116* 110*   D-Dimer No results for input(s): DDIMER in the last 72 hours. Hgb A1c No results for input(s): HGBA1C in the last 72 hours. Lipid Profile No results for input(s): CHOL, HDL, LDLCALC, TRIG, CHOLHDL, LDLDIRECT in the last 72 hours. Thyroid function studies No results for input(s): TSH, T4TOTAL, T3FREE, THYROIDAB in the last 72 hours.  Invalid input(s): FREET3 Anemia work up No results for input(s): VITAMINB12, FOLATE, FERRITIN, TIBC, IRON, RETICCTPCT in the last 72 hours. Microbiology Recent Results (from the past 240 hour(s))  Blood Culture (routine x 2)     Status: None (Preliminary result)   Collection Time: 12/11/20 12:25 AM   Specimen: BLOOD  Result Value Ref Range Status   Specimen Description   Final    BLOOD RIGHT ANTECUBITAL Performed at [facility], [place] [address], [place], [place] [address]    Special Requests   Final    BOTTLES DRAWN AEROBIC AND ANAEROBIC Blood Culture adequate volume Performed at [facility] [address] [place], [place] [address]    Culture   Final    NO GROWTH 2 DAYS Performed at [facility] [address] [place], [place] [address]    Report Status PENDING  Incomplete  Resp Panel by RT-PCR (Flu A&B, Covid) Nasopharyngeal Swab     Status: None   Collection Time: 12/11/20  9:12 PM   Specimen: Nasopharyngeal Swab; Nasopharyngeal(NP) swabs in vial transport medium  Result Value Ref Range Status   SARS Coronavirus 2 by RT PCR NEGATIVE NEGATIVE Final    Comment: (NOTE) SARS-CoV-2 target nucleic acids are NOT DETECTED.  The SARS-CoV-2 RNA is generally detectable in upper respiratory specimens during the acute phase of infection. The lowest concentration of SARS-CoV-2 viral copies this assay can detect is 138 copies/mL. A negative result does not preclude SARS-Cov-2 infection and should not be used as the sole basis for treatment or other patient management decisions. A negative result may occur with  improper specimen collection/handling, submission of specimen other than nasopharyngeal swab, presence of viral mutation(s) within the areas targeted by this assay, and inadequate number of viral copies(<138 copies/mL). A negative result must be combined with clinical observations, patient history, and epidemiological information. The expected result is Negative.  Fact Sheet for Patients:  EntrepreneurPulse.com.au  Fact Sheet for Healthcare Providers:  IncredibleEmployment.be  This test is no t yet approved or cleared by the [place] FDA and  has been authorized for detection and/or diagnosis of SARS-CoV-2 by FDA under an Emergency Use Authorization (EUA). This EUA will remain  in effect (meaning this test can be used) for the  duration of the COVID-19 declaration under Section 564(b)(1) of the Act, 21 U.S.C.section 360bbb-3(b)(1), unless the authorization is terminated  or revoked sooner.       Influenza A by PCR NEGATIVE NEGATIVE Final   Influenza B by PCR NEGATIVE NEGATIVE Final    Comment: (NOTE) The Xpert Xpress SARS-CoV-2/FLU/RSV plus assay is intended as an aid in the diagnosis of influenza from Nasopharyngeal swab specimens and should not be used as a sole basis for treatment. Nasal washings and aspirates are unacceptable for Xpert Xpress SARS-CoV-2/FLU/RSV testing.  Fact Sheet for Patients: EntrepreneurPulse.com.au  Fact Sheet for Healthcare Providers: IncredibleEmployment.be  This test is not yet approved or cleared by the [place] FDA and has been authorized for detection  and/or diagnosis of SARS-CoV-2 by FDA under an Emergency Use Authorization (EUA). This EUA will remain in effect (meaning this test can be used) for the duration of the COVID-19 declaration under Section 564(b)(1) of the Act, 21 U.S.C. section 360bbb-3(b)(1), unless the authorization is terminated or revoked.  Performed at [facility], [place] [address], [place], [place] [address]   Blood Culture (routine x 2)     Status: None (Preliminary result)   Collection Time: 12/11/20  9:12 PM   Specimen: BLOOD  Result Value Ref Range Status   Specimen Description   Final    BLOOD LEFT ANTECUBITAL Performed at [facility], [place] [address], [place], [place] [address]    Special Requests   Final    BOTTLES DRAWN AEROBIC AND ANAEROBIC Blood Culture adequate volume Performed at [facility] [address] [place], [place] [address]    Culture   Final    NO GROWTH 2 DAYS Performed at [facility] [address], [place], [place] [address]    Report Status PENDING  Incomplete  Urine Culture     Status: Abnormal   Collection Time:  12/11/20  9:29 PM   Specimen: Urine, Clean Catch  Result Value Ref Range Status   Specimen Description   Final    URINE, CLEAN CATCH Performed at [facility], [place] [address] [place], [place] [address]    Special Requests   Final    NONE Performed at [facility], [place] [address] [place], [place] [address]    Culture (A)  Final    <10,000 COLONIES/mL INSIGNIFICANT GROWTH Performed at [facility] [address], [place], [place] [address]    Report Status 12/12/2020 FINAL  Final      Studies:  No results found.  Assessment: 60 y.o. female   Recently diagnosed ALL, on Blincyto, stopped since admission Fever, resolved now, negative ID work up 3.  Tumor lysis syndrome  4. Acute on chronic renal disease 5. Pancytopenia secondary to chemo  6. HTN   Plan:  -I ordered a uric acid this morning, it came back 10, it has been trending up, this is concerning for tumor lysis syndrome from her ALL and chemo -I ordered her 1 dose rasburicase 6 mg, hopefully it will improve her kidney function -I spoke with leukemia attending Dr. Linus Orn at [facility], given her fever has resolved, and she has been hemodialyze stable, no strong evidence of cytokine release syndrome, will stop dexamethasone. -Patient would be best managed at [facility] given her underline ALL and on active treatment which we do not have much experience about that. She is on waiting list to be transferred. Dr. Linus Orn has told me that they may have a bed available for her later today. -I will f/u as needed tomorrow. Please check uric acid, CBC with diff and renal function panel for tomorrow morning if she is still here. If blood transfusion is needed, please give irradiated blood products.    Truitt Merle, MD 12/14/2020  3:19 PM

## 2020-12-14 NOTE — Plan of Care (Signed)

## 2020-12-14 NOTE — TOC Progression Note (Signed)
Transition of Care [facility]) - Progression Note    Patient Details  Name: Tracey Stevens MRN: [id number] Date of Birth: 1960-11-23  [facility] CM/SW Contact  Purcell Mouton, RN Phone Number: 12/14/2020, 11:38 AM  Clinical Narrative:    Spoke with pt concerning acute to acute transfer. Pt was told CM would do transfer. Explained to pt that her Attending MD will make the call to [place] to another MD to accept her. 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  MD informed CM that pt is now on the top of the list to transport to [facility]. This is Acute to Acute, Floor CRN in charge of transfer with [facility].    Expected Discharge Plan: Acute to Acute Transfer Barriers to Discharge: No Barriers Identified  Expected Discharge Plan and Services Expected Discharge Plan: Acute to Acute Transfer       Living arrangements for the past 2 months: Single Family Home                                       Social Determinants of Health (SDOH) Interventions    Readmission Risk Interventions No flowsheet data found.

## 2020-12-14 NOTE — Progress Notes (Signed)
Oncology brief note   I was called by Dr. Lonny Prude about this pt. Chart reviewed, pt has developed worsening renal failure and cytopenias. I ordered stat uric acid and it came back high at 10. I ordered one dose rasburicase 6mg  for tumor lysis syndrome.  I called [facility] attending Dr. Linus Orn and reviewed the case with him. He does not think pt has cytokine release syndrome and does not think pt needs steroid. So I cancelled dexa. I again requested transferring pt to their service and he said they will likely have a bed for pt later today.   I will round on pt later today of she is still in hospital here.   Truitt Merle  12/14/2020

## 2020-12-15 LAB — RENAL FUNCTION PANEL
Albumin: 2 g/dL — ABNORMAL LOW (ref 3.5–5.0)
Anion gap: 13 (ref 5–15)
BUN: 85 mg/dL — ABNORMAL HIGH (ref 8–23)
CO2: 21 mmol/L — ABNORMAL LOW (ref 22–32)
Calcium: 8.7 mg/dL — ABNORMAL LOW (ref 8.9–10.3)
Chloride: 107 mmol/L (ref 98–111)
Creatinine, Ser: 6.15 mg/dL — ABNORMAL HIGH (ref 0.44–1.00)
GFR, Estimated: 7 mL/min — ABNORMAL LOW (ref >60)
Glucose, Bld: 106 mg/dL — ABNORMAL HIGH (ref 70–99)
Phosphorus: 9.7 mg/dL — ABNORMAL HIGH (ref 2.5–4.6)
Potassium: 4.7 mmol/L (ref 3.5–5.1)
Sodium: 141 mmol/L (ref 135–145)

## 2020-12-15 LAB — CBC WITH DIFFERENTIAL/PLATELET
Abs Immature Granulocytes: 0.06 10*3/uL (ref 0.00–0.07)
Basophils Absolute: 0 10*3/uL (ref 0.0–0.1)
Basophils Relative: 0 %
Eosinophils Absolute: 0 10*3/uL (ref 0.0–0.5)
Eosinophils Relative: 0 %
HCT: 24.1 % — ABNORMAL LOW (ref 36.0–46.0)
Hemoglobin: 7.6 g/dL — ABNORMAL LOW (ref 12.0–15.0)
Immature Granulocytes: 4 %
Lymphocytes Relative: 44 %
Lymphs Abs: 0.7 10*3/uL (ref 0.7–4.0)
MCH: 31.1 pg (ref 26.0–34.0)
MCHC: 31.5 g/dL (ref 30.0–36.0)
MCV: 98.8 fL (ref 80.0–100.0)
Monocytes Absolute: 0.2 10*3/uL (ref 0.1–1.0)
Monocytes Relative: 10 %
Neutro Abs: 0.7 10*3/uL — ABNORMAL LOW (ref 1.7–7.7)
Neutrophils Relative %: 42 %
Platelets: 52 10*3/uL — ABNORMAL LOW (ref 150–400)
RBC: 2.44 MIL/uL — ABNORMAL LOW (ref 3.87–5.11)
RDW: 16.3 % — ABNORMAL HIGH (ref 11.5–15.5)
WBC: 1.6 10*3/uL — ABNORMAL LOW (ref 4.0–10.5)
nRBC: 1.8 % — ABNORMAL HIGH (ref 0.0–0.2)

## 2020-12-15 LAB — URIC ACID: Uric Acid, Serum: 4.7 mg/dL (ref 2.5–7.1)

## 2020-12-15 LAB — GLUCOSE, CAPILLARY
Glucose-Capillary: 119 mg/dL — ABNORMAL HIGH (ref 70–99)
Glucose-Capillary: 78 mg/dL (ref 70–99)

## 2020-12-15 NOTE — Progress Notes (Signed)
Pharmacy Antibiotic Note  Tracey Stevens is a 60 y.o. female admitted on 12/11/2020 with  fever of unknown origin .  Pharmacy has been consulted for Cefepime & Vancomycin dosing.  Today, 12/15/20  SCr peaked at 6.37 yesterday, 6.15 today hopefully trending down  Plan: Continue cefepime 2gm IV q24h  Flagyl per MD  Monitor renal function and cx data   Height: 6\' 1"  (185.4 cm) Weight: (!) 156.1 kg (344 lb 3.2 oz) IBW/kg (Calculated) : 75.4  Temp (24hrs), Avg:98.1 F (36.7 C), Min:97.5 F (36.4 C), Max:99 F (37.2 C)  Recent Labs  Lab 12/11/20 2112 12/12/20 0127 12/12/20 0403 12/13/20 0346 12/13/20 1200 12/14/20 0340 12/15/20 0314  WBC 4.6  --  4.1 2.5*  --  1.7* 1.6*  CREATININE 5.01*  --  5.06* 5.90* 5.76* 6.37* 6.15*  LATICACIDVEN 1.5 0.8 0.9  --   --   --   --   VANCORANDOM  --   --   --   --   --  14  --      Estimated Creatinine Clearance: 16.3 mL/min (A) (by C-G formula based on SCr of 6.15 mg/dL (H)).    Allergies  Allergen Reactions   Amlodipine Swelling    Peripheral edema, ankles swell Peripheral edema     Antimicrobials this admission: 9/28 Cefepime >>  9/29 Vancomycin >> 10/1 9/29 Flagyl>>  Dose adjustments this admission:  Microbiology results: 9/28 BCx: ngtd 9/28 UCx:  insignificant grwth  Thank you for allowing pharmacy to be a part of this patient's care.  Ulice Dash D PharmD 12/15/2020 10:53 AM

## 2020-12-15 NOTE — TOC Progression Note (Signed)
Transition of Care [facility]) - Progression Note    Patient Details  Name: Tracey Stevens MRN: [id number] Date of Birth: April 16, 1960  Transition [facility] Care [facility]) CM/SW Contact  Alok Minshall, Marta Lamas, [place] Phone Number: 12/15/2020, 10:56 AM  Clinical Narrative:     CSW received request from Ardeen Garland, RN to arrange transportation for patient from [facility] to [facility], for further evaluation and treatment of ALL (Acute Lymphocytic Leukemia).  Transportation arrangements have been made through [employer] Form has been completed and printed to the unit.  Abby agreed to take the completed and signed form off the printer and put with patient's discharge packet.  No additional social work needs have been identified at this time.  Expected Discharge Plan: Acute to Acute Transfer Barriers to Discharge: No Barriers Identified  Expected Discharge Plan and Services Expected Discharge Plan: Acute to Acute Transfer    Living arrangements for the past 2 months: Single Family Home  Social Determinants of Health (SDOH) Interventions  Transportation Assistance.  Readmission Risk Interventions No flowsheet data found.  Nat Christen, BSW, MSW, [employer]  Licensed Holiday representative  [employer]  Mailing Address-[address], [place], [place] [address] Physical Address-[address] [place], [place] [address] Toll Free Main # [phone] Fax # [phone] Cell # [phone]  [email or web address]

## 2020-12-17 LAB — CULTURE, BLOOD (ROUTINE X 2)
Culture: NO GROWTH
Culture: NO GROWTH
Special Requests: ADEQUATE
Special Requests: ADEQUATE

## 2020-12-20 DIAGNOSIS — N186 End stage renal disease: Secondary | ICD-10-CM | POA: Insufficient documentation

## 2020-12-23 DIAGNOSIS — Q6102 Congenital multiple renal cysts: Secondary | ICD-10-CM | POA: Insufficient documentation

## 2020-12-23 DIAGNOSIS — H9012 Conductive hearing loss, unilateral, left ear, 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: Secondary | ICD-10-CM | POA: Insufficient documentation

## 2021-01-31 ENCOUNTER — Other Ambulatory Visit: Payer: Self-pay

## 2021-01-31 DIAGNOSIS — C8338 Diffuse large B-cell lymphoma, lymph nodes of multiple sites: Secondary | ICD-10-CM

## 2021-02-03 ENCOUNTER — Inpatient Hospital Stay: Payer: Medicare HMO | Attending: Family Medicine

## 2021-02-03 ENCOUNTER — Inpatient Hospital Stay: Payer: Medicare HMO | Admitting: Hematology

## 2021-03-20 ENCOUNTER — Emergency Department (HOSPITAL_COMMUNITY): Payer: Medicare HMO

## 2021-03-20 ENCOUNTER — Inpatient Hospital Stay (HOSPITAL_COMMUNITY)
Admission: EM | Admit: 2021-03-20 | Discharge: 2021-03-22 | DRG: 871 | Disposition: A | Discharge status: Other Acute Inpatient Hospital | Payer: Medicare HMO | Attending: Internal Medicine | Admitting: Internal Medicine

## 2021-03-20 ENCOUNTER — Encounter (HOSPITAL_COMMUNITY): Payer: Self-pay

## 2021-03-20 DIAGNOSIS — R069 Unspecified abnormalities of breathing: Secondary | ICD-10-CM

## 2021-03-20 DIAGNOSIS — Z9115 Patient's noncompliance with renal dialysis: Secondary | ICD-10-CM

## 2021-03-20 DIAGNOSIS — D6481 Anemia due to antineoplastic chemotherapy: Secondary | ICD-10-CM | POA: Diagnosis present

## 2021-03-20 DIAGNOSIS — E1122 Type 2 diabetes mellitus with diabetic chronic kidney disease: Secondary | ICD-10-CM | POA: Diagnosis present

## 2021-03-20 DIAGNOSIS — D709 Neutropenia, unspecified: Secondary | ICD-10-CM | POA: Diagnosis present

## 2021-03-20 DIAGNOSIS — N2581 Secondary hyperparathyroidism of renal origin: Secondary | ICD-10-CM | POA: Diagnosis present

## 2021-03-20 DIAGNOSIS — H9190 Unspecified hearing loss, unspecified ear: Secondary | ICD-10-CM | POA: Diagnosis present

## 2021-03-20 DIAGNOSIS — D631 Anemia in chronic kidney disease: Secondary | ICD-10-CM | POA: Diagnosis present

## 2021-03-20 DIAGNOSIS — C9100 Acute lymphoblastic leukemia not having achieved remission: Secondary | ICD-10-CM | POA: Diagnosis present

## 2021-03-20 DIAGNOSIS — Z800 Family history of malignant neoplasm of digestive organs: Secondary | ICD-10-CM

## 2021-03-20 DIAGNOSIS — Z992 Dependence on renal dialysis: Secondary | ICD-10-CM

## 2021-03-20 DIAGNOSIS — Z8541 Personal history of malignant neoplasm of cervix uteri: Secondary | ICD-10-CM

## 2021-03-20 DIAGNOSIS — N179 Acute kidney failure, unspecified: Secondary | ICD-10-CM | POA: Diagnosis present

## 2021-03-20 DIAGNOSIS — D6959 Other secondary thrombocytopenia: Secondary | ICD-10-CM | POA: Diagnosis present

## 2021-03-20 DIAGNOSIS — R5081 Fever presenting with conditions classified elsewhere: Secondary | ICD-10-CM | POA: Diagnosis present

## 2021-03-20 DIAGNOSIS — Z20822 Contact with and (suspected) exposure to covid-19: Secondary | ICD-10-CM | POA: Diagnosis present

## 2021-03-20 DIAGNOSIS — E669 Obesity, unspecified: Secondary | ICD-10-CM | POA: Diagnosis present

## 2021-03-20 DIAGNOSIS — R1310 Dysphagia, unspecified: Secondary | ICD-10-CM | POA: Diagnosis present

## 2021-03-20 DIAGNOSIS — Z888 Allergy status to other drugs, medicaments and biological substances status: Secondary | ICD-10-CM

## 2021-03-20 DIAGNOSIS — A419 Sepsis, unspecified organism: Secondary | ICD-10-CM | POA: Diagnosis not present

## 2021-03-20 DIAGNOSIS — J0510 Acute epiglottitis without obstruction: Secondary | ICD-10-CM | POA: Diagnosis not present

## 2021-03-20 DIAGNOSIS — I120 Hypertensive chronic kidney disease with stage 5 chronic kidney disease or end stage renal disease: Secondary | ICD-10-CM | POA: Diagnosis present

## 2021-03-20 DIAGNOSIS — E559 Vitamin D deficiency, unspecified: Secondary | ICD-10-CM | POA: Diagnosis present

## 2021-03-20 DIAGNOSIS — Z806 Family history of leukemia: Secondary | ICD-10-CM

## 2021-03-20 DIAGNOSIS — N186 End stage renal disease: Secondary | ICD-10-CM | POA: Diagnosis present

## 2021-03-20 DIAGNOSIS — Z6839 Body mass index (BMI) 39.0-39.9, adult: Secondary | ICD-10-CM

## 2021-03-20 DIAGNOSIS — E213 Hyperparathyroidism, unspecified: Secondary | ICD-10-CM | POA: Diagnosis present

## 2021-03-20 DIAGNOSIS — Z8249 Family history of ischemic heart disease and other diseases of the circulatory system: Secondary | ICD-10-CM

## 2021-03-20 DIAGNOSIS — M898X9 Other specified disorders of bone, unspecified site: Secondary | ICD-10-CM | POA: Diagnosis present

## 2021-03-20 DIAGNOSIS — D61818 Other pancytopenia: Secondary | ICD-10-CM | POA: Diagnosis present

## 2021-03-20 DIAGNOSIS — N185 Chronic kidney disease, stage 5: Secondary | ICD-10-CM | POA: Diagnosis present

## 2021-03-20 LAB — COMPREHENSIVE METABOLIC PANEL
ALT: 12 U/L (ref 0–44)
AST: 21 U/L (ref 15–41)
Albumin: 2.8 g/dL — ABNORMAL LOW (ref 3.5–5.0)
Alkaline Phosphatase: 77 U/L (ref 38–126)
Anion gap: 17 — ABNORMAL HIGH (ref 5–15)
BUN: 90 mg/dL — ABNORMAL HIGH (ref 8–23)
CO2: 21 mmol/L — ABNORMAL LOW (ref 22–32)
Calcium: 7.9 mg/dL — ABNORMAL LOW (ref 8.9–10.3)
Chloride: 101 mmol/L (ref 98–111)
Creatinine, Ser: 18.35 mg/dL — ABNORMAL HIGH (ref 0.44–1.00)
GFR, Estimated: 2 mL/min — ABNORMAL LOW (ref >60)
Glucose, Bld: 98 mg/dL (ref 70–99)
Potassium: 4.2 mmol/L (ref 3.5–5.1)
Sodium: 139 mmol/L (ref 135–145)
Total Bilirubin: 0.9 mg/dL (ref 0.3–1.2)
Total Protein: 5.5 g/dL — ABNORMAL LOW (ref 6.5–8.1)

## 2021-03-20 LAB — RESP PANEL BY RT-PCR (FLU A&B, COVID) ARPGX2
Influenza A by PCR: NEGATIVE
Influenza B by PCR: NEGATIVE
SARS Coronavirus 2 by RT PCR: NEGATIVE

## 2021-03-20 LAB — CBC WITH DIFFERENTIAL/PLATELET
Abs Immature Granulocytes: 0.05 10*3/uL (ref 0.00–0.07)
Basophils Absolute: 0 10*3/uL (ref 0.0–0.1)
Basophils Relative: 0 %
Eosinophils Absolute: 0 10*3/uL (ref 0.0–0.5)
Eosinophils Relative: 4 %
HCT: 23.8 % — ABNORMAL LOW (ref 36.0–46.0)
Hemoglobin: 7.9 g/dL — ABNORMAL LOW (ref 12.0–15.0)
Immature Granulocytes: 11 %
Lymphocytes Relative: 36 %
Lymphs Abs: 0.2 10*3/uL — ABNORMAL LOW (ref 0.7–4.0)
MCH: 29.8 pg (ref 26.0–34.0)
MCHC: 33.2 g/dL (ref 30.0–36.0)
MCV: 89.8 fL (ref 80.0–100.0)
Monocytes Absolute: 0.1 10*3/uL (ref 0.1–1.0)
Monocytes Relative: 11 %
Neutro Abs: 0.2 10*3/uL — CL (ref 1.7–7.7)
Neutrophils Relative %: 38 %
Platelets: 86 10*3/uL — ABNORMAL LOW (ref 150–400)
RBC: 2.65 MIL/uL — ABNORMAL LOW (ref 3.87–5.11)
RDW: 13.1 % (ref 11.5–15.5)
WBC: 0.5 10*3/uL — CL (ref 4.0–10.5)
nRBC: 0 % (ref 0.0–0.2)

## 2021-03-20 LAB — PROTIME-INR
INR: 1.3 — ABNORMAL HIGH (ref 0.8–1.2)
Prothrombin Time: 16.5 seconds — ABNORMAL HIGH (ref 11.4–15.2)

## 2021-03-20 LAB — APTT: aPTT: 38 seconds — ABNORMAL HIGH (ref 24–36)

## 2021-03-20 LAB — LACTIC ACID, PLASMA: Lactic Acid, Venous: 1.1 mmol/L (ref 0.5–1.9)

## 2021-03-20 MED ORDER — VANCOMYCIN HCL 10 G IV SOLR
2500.0000 mg | Freq: Once | INTRAVENOUS | Status: AC
  Administered 2021-03-21: 2500 mg via INTRAVENOUS
  Filled 2021-03-20: qty 2500

## 2021-03-20 MED ORDER — DEXAMETHASONE SODIUM PHOSPHATE 10 MG/ML IJ SOLN
10.0000 mg | Freq: Once | INTRAMUSCULAR | Status: AC
  Administered 2021-03-20: 10 mg via INTRAVENOUS
  Filled 2021-03-20: qty 1

## 2021-03-20 MED ORDER — LACTATED RINGERS IV BOLUS
500.0000 mL | Freq: Once | INTRAVENOUS | Status: AC
  Administered 2021-03-20: 500 mL via INTRAVENOUS

## 2021-03-20 MED ORDER — PIPERACILLIN-TAZOBACTAM 3.375 G IVPB
3.3750 g | Freq: Once | INTRAVENOUS | Status: AC
Start: 2021-03-20 — End: 2021-03-21
  Administered 2021-03-20: 3.375 g via INTRAVENOUS
  Filled 2021-03-20: qty 50

## 2021-03-20 NOTE — ED Triage Notes (Addendum)
Patient BIBEMS for sore throat. Patient dx with strep throat Monday, patient reports no improvement with Antibiotics continues to have fevers even with Tylenol (Last dose 1000mg ). Patient is cancer patient, last received chemo 2 weeks ago, Patient also dialysis patient, last dialysis was 03/14/21. Usually MWF.  Patient has notable swelling to face and throat with voice changes. Patient states this welling is not worse than when diagnosed Monday  EMS vitals: 170/98 HR 120's SPO2: 95 RA Resp: 24

## 2021-03-20 NOTE — ED Provider Notes (Signed)
[facility] EMER[facility]ENC[facility] DEPARTMENT Provider Note   CSN: [id number] Arrival date & time: 03/20/21  1940     History  Chief Complaint  Patient presents with   Sore Throat    Tracey Stevens is a 61 y.o. 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  Patient said her symptoms began on Monday 1/2.  She has had difficulty eating and swallowing due to pain in her throat.  This has been associated with nasal congestion and a mild cough.  She has not had any nausea, vomiting, or diarrhea.  She denies significant headache.  She was seen via telemedicine visit yesterday and was started on Augmentin for her symptoms.  She says despite the antibiotics her symptoms gotten worse.  She continues to have severe pain with swallowing, talking, and now endorses voice changes.  Due to progression of her symptoms on antibiotics she presented to the emergency department for further evaluation.    Home Medications Prior to Admission medications   Medication Sig Start Date End Date Taking? Authorizing Provider  atorvastatin (LIPITOR) 20 MG tablet Take 20 mg by mouth daily.   Yes [provider]  cholecalciferol (VITAMIN D) 1000 units tablet Take 4,000 Units by mouth daily.    Yes [provider]  gabapentin (NEURONTIN) 300 MG capsule Take 300 mg by mouth daily.   Yes [provider]  losartan (COZAAR) 50 MG tablet Take 50 mg by mouth daily.   Yes [provider]  methocarbamol (ROBAXIN) 500 MG tablet Take 500 mg by mouth daily as needed for muscle spasms. 06/05/20  Yes [provider]  omeprazole (PRILOSEC) 40 MG capsule Take 40 mg by mouth daily. 10/18/20  Yes [provider]  ondansetron (ZOFRAN) 8 MG tablet Take 8 mg by mouth every 8 (eight) hours as needed for vomiting or nausea. 10/04/20  Yes  [provider]      Allergies    Amlodipine    Review of Systems   Review of Systems  Constitutional:  Positive for fever.  HENT:  Positive for congestion, sore throat, trouble swallowing and voice change. Negative for drooling.   Respiratory:  Positive for cough. Negative for shortness of breath and stridor.   Cardiovascular:  Negative for chest pain.  Gastrointestinal:  Negative for abdominal pain, nausea and vomiting.   Physical Exam Updated Vital Signs BP 129/80    Pulse (!) 110    Temp 100 F (37.8 C) (Oral)    Resp (!) 24    SpO2 96%   Physical Exam Vitals and nursing note reviewed.  Constitutional:      Appearance: She is well-developed. She is obese. She is ill-appearing.  HENT:     Head: Normocephalic and atraumatic.     Right Ear: External ear normal.     Left Ear: External ear normal.     Mouth/Throat:     Mouth: Mucous membranes are dry.     Tongue: Tongue does not deviate from midline.     Palate: No mass.     Comments: Unable to visualize posterior oropharynx.  Patient is tolerating her secretions.  Patient has a high-pitched voice which she says is different from her baseline. Eyes:     Extraocular Movements: Extraocular movements intact.     Conjunctiva/sclera: Conjunctivae normal.     Pupils: Pupils are equal, round, and reactive  to light.  Cardiovascular:     Rate and Rhythm: Regular rhythm. Tachycardia present.     Pulses: Normal pulses.     Heart sounds: Normal heart sounds. No murmur heard. Pulmonary:     Effort: Pulmonary effort is normal. No respiratory distress.     Breath sounds: Normal breath sounds. No stridor.     Comments: Patient is tolerating her own secretions, no respiratory distress.  No stridor noted. Abdominal:     Palpations: Abdomen is soft.     Tenderness: There is no abdominal tenderness.  Musculoskeletal:        General: No swelling.     Cervical back: Neck supple.     Right lower leg: Edema present.     Left lower  leg: Edema present.  Skin:    General: Skin is warm and dry.     Capillary Refill: Capillary refill takes less than 2 seconds.  Neurological:     Mental Status: She is alert and oriented to person, place, and time.  Psychiatric:        Mood and Affect: Mood normal.    ED Results / Procedures / Treatments   Labs (all labs ordered are listed, but only abnormal results are displayed) Labs Reviewed  CBC WITH DIFFERENTIAL/PLATELET - Abnormal; Notable for the following components:      Result Value   WBC 0.5 (*)    RBC 2.65 (*)    Hemoglobin 7.9 (*)    HCT 23.8 (*)    Platelets 86 (*)    Neutro Abs 0.2 (*)    Lymphs Abs 0.2 (*)    All other components within normal limits  COMPREHENSIVE METABOLIC PANEL - Abnormal; Notable for the following components:   CO2 21 (*)    BUN 90 (*)    Creatinine, Ser 18.35 (*)    Calcium 7.9 (*)    Total Protein 5.5 (*)    Albumin 2.8 (*)    GFR, Estimated 2 (*)    Anion gap 17 (*)    All other components within normal limits  PROTIME-INR - Abnormal; Notable for the following components:   Prothrombin Time 16.5 (*)    INR 1.3 (*)    All other components within normal limits  APTT - Abnormal; Notable for the following components:   aPTT 38 (*)    All other components within normal limits  RESP PANEL BY RT-PCR (FLU A&B, COVID) ARPGX2  CULTURE, BLOOD (ROUTINE X 2)  CULTURE, BLOOD (ROUTINE X 2)  URINE CULTURE  LACTIC ACID, PLASMA  LACTIC ACID, PLASMA  CBC WITH DIFFERENTIAL/PLATELET  URINALYSIS, ROUTINE W REFLEX MICROSCOPIC    EKG None  Radiology CT Soft Tissue Neck Wo Contrast  Result Date: 03/20/2021 CLINICAL DATA:  Initial evaluation for acute neck/throat pain, epiglottitis or tonsillitis suspected. EXAM: CT NECK WITHOUT CONTRAST TECHNIQUE: Multidetector CT imaging of the neck was performed following the standard protocol without intravenous contrast. COMPARISON:  None available. FINDINGS: Pharynx and larynx: Oral cavity within normal  limits. No evidence for acute tonsillitis. The epiglottis is somewhat thickened and edematous, more pronounced on the left (series 3, image 70). Suggestion of mild mucosal edema within the adjacent oropharyngeal mucosa. Hazy stranding seen within the retropharyngeal space, consistent with edema and/or effusion. Findings concerning for acute pharyngitis/supraglottitis. Oropharyngeal airway remains patent at this time. Hazy stranding extends to involve the bilateral parapharyngeal spaces, with additional inflammatory stranding involving the right greater than left face at the level of the masticator spaces. No visible  loculated collections. Glottis itself within normal limits. Subglottic airway clear. Salivary glands: Salivary glands including the parotid and submandibular glands within normal limits. Thyroid: Visualized thyroid normal. Lymph nodes: No enlarged or pathologic adenopathy within the neck. Vascular: Left-sided dialysis catheter partially visualized. Right-sided Port-A-Cath in place. Evaluation of the vascular structures otherwise limited by lack of IV contrast. Minimal for age atheromatous plaque about the left carotid bifurcation. Limited intracranial: Unremarkable. Visualized orbits: Unremarkable. Mastoids and visualized paranasal sinuses: Paranasal sinuses are largely clear. No significant mastoid effusion. Skeleton: Prominent diffuse sclerosis seen about the maxilla and mandible, suspected be related underlying dental disease. No other discrete or worrisome osseous lesions. Upper chest: Unremarkable. Other: None. IMPRESSION: Findings concerning for acute pharyngitis/supraglottitis, with involvement of the left epiglottis. Associated hazy inflammatory stranding within the retropharyngeal and parapharyngeal spaces, with extension to involve the right greater than left masticator spaces. No discrete abscess or drainable fluid collection seen on this noncontrast examination. Supraglottic airway remains  patent at this time. Electronically Signed   By: Jeannine Boga M.D.   On: 03/20/2021 23:20   DG Chest Port 1 View  Result Date: 03/20/2021 CLINICAL DATA:  Questionable sepsis. EXAM: PORTABLE CHEST 1 VIEW COMPARISON:  Chest x-ray 12/11/2020. FINDINGS: Bilateral central venous catheters are present ending in the distal SVC. Heart is mildly enlarged, unchanged. Mediastinal silhouette is within normal limits. No definite lung infiltrate, pleural effusion or pneumothorax identified. No acute fractures are seen. IMPRESSION: 1. No acute cardiopulmonary process. Electronically Signed   By: Ronney Asters M.D.   On: 03/20/2021 21:23    Procedures Procedures   Medications Ordered in ED Medications  vancomycin (VANCOCIN) 2,500 mg in sodium chloride 0.9 % 500 mL IVPB (has no administration in time range)  piperacillin-tazobactam (ZOSYN) IVPB 3.375 g (3.375 g Intravenous New Bag/Given 03/20/21 2147)  dexamethasone (DECADRON) injection 10 mg (10 mg Intravenous Given 03/20/21 2145)  lactated ringers bolus 500 mL (500 mLs Intravenous New Bag/Given 03/20/21 2248)    ED Course/ Medical Decision Making/ A&P Clinical Course as of 03/20/21 2355  Thu Mar 20, 2021  2309 Pharyngitis, getting admitted, pending CT [MK]    Clinical Course User Index [MK] Kommor, [place], MD                           Patient presents with symptoms of worsening pharyngitis while on antibiotics according to HPI above.  Upon initial evaluation, patient is febrile, mildly tachypneic, tachycardic but hemodynamically stable, and saturating well on room air no acute distress.  Patient meets sepsis criteria.  Broad-spectrum antibiotics ordered.  Patient is ESRD, does not make urine, and has missed several sessions of dialysis so we will not order typical 30 cc/kg fluid bolus for sepsis.  Additionally, patient is not in septic shock making fluid resuscitation less prudent.  Given voice changes, CT scan ordered to evaluate for neck abscess or  soft tissue infection and evaluate airway patency.  Patient is protecting her airway at this time.  Labs reviewed by myself demonstrate severe neutropenia and pancytopenia consistent with known ALL on chemotherapy.  Metabolic panel reviewed by myself demonstrates acute on chronic renal failure as evidenced by creatinine of 18 above baseline of about 6.  No severe electrolyte abnormalities or any other emergent indication for dialysis.  Patient does have bilateral lower extremity edema indicating volume overload, but she is saturating well on room air and CXR does not demonstrate any evidence of pulmonary edema.  Patient will require  dialysis this admission, however we will defer nephrology consultation to inpatient team at this time.  CT demonstrates findings consistent with epiglottitis without definitive abscess or fluid collection.  Airway appears patent on CT.  I discussed the patient with ENT who will plan to see the patient in the morning given the patient is currently tolerating their secretions and airway is patent on CT.  We will plan to discuss admission with hospitalist team.  Patient handoff was given to Dr. Matilde Sprang.  Final Clinical Impression(s) / ED Diagnoses Final diagnoses:  Epiglottitis    Rx / DC Orders ED Discharge Orders     None         Auther Lyerly, Amalia Hailey, MD 03/21/21 1126    Lucrezia Starch, MD 03/21/21 2356

## 2021-03-20 NOTE — ED Notes (Signed)
MD Dykstra notified about critical WBC labs

## 2021-03-20 NOTE — Sepsis Progress Note (Signed)
Elink following Code Sepsis. 

## 2021-03-20 NOTE — ED Notes (Signed)
Pt's pulse ox sats upper 80s while sleeping. Pt denied sleep apnea. This RN placed pt on 2L [place] for comfort.

## 2021-03-20 NOTE — ED Provider Triage Note (Addendum)
Emergency Medicine Provider Triage Evaluation Note  Tracey Stevens , a 61 y.o. female  was evaluated in triage.  Not in by EMS for sore throat.  Patient was diagnosed with strep throat on Monday and initiated on Augmentin.  She states that she has not had improvement with her antibiotics and has continued to have fevers.  She took Tylenol, 1000 mg at 5 PM.  She has had increased facial and neck swelling since diagnosis.  She now has voice changes and difficulty swallowing.  Of note she is a dialysis patient.  Last dialysis was 03/14/2021.  She is usually a Monday Wednesday Friday dialysis patient.  Additionally she is a cancer patient.  She last received chemo on 03/13/2021.  She is receiving intrathecal chemotherapy.  She has acute lymphoblastic leukemia.  Review of Systems  Positive: The above Negative:  Physical Exam  BP 103/63 (BP Location: Right Arm)    Pulse (!) 130    Temp (!) 101.4 F (38.6 C) (Oral)    Resp (!) 24    SpO2 96%  Gen:   Awake and alert. Resp:  Normal effort  MSK:   Moves extremities without difficulty  Other:  Significant swelling to the face and neck.  Patient has hot potato voice.  Unable to visualize the back of the throat.  She does not have tenderness to palpation. Tachycardic Febrile to 101.4  Medical Decision Making  Medically screening exam initiated at 8:03 PM.  Appropriate orders placed.  Tamiko Badie was informed that the remainder of the evaluation will be completed by another provider, this initial triage assessment does not replace that evaluation, and the importance of remaining in the ED until their evaluation is complete.  Patient needs room ASAP.  Sepsis work-up initiated.  Patient needs CT soft tissue neck.  Charge made aware.  Patient will not be put back in lobby until she is able to get a room.   Mickie Hillier, PA-C 03/20/21 2006    Mickie Hillier, PA-C 03/20/21 2008

## 2021-03-20 NOTE — ED Notes (Signed)
Patient transported to CT 

## 2021-03-20 NOTE — ED Notes (Signed)
Pt stated she doesn't produce much urine but will call if she needs to urinate.

## 2021-03-21 ENCOUNTER — Other Ambulatory Visit: Payer: Self-pay

## 2021-03-21 ENCOUNTER — Encounter (HOSPITAL_COMMUNITY): Payer: Self-pay | Admitting: Pulmonary Disease

## 2021-03-21 DIAGNOSIS — C9100 Acute lymphoblastic leukemia not having achieved remission: Secondary | ICD-10-CM | POA: Diagnosis present

## 2021-03-21 DIAGNOSIS — A419 Sepsis, unspecified organism: Principal | ICD-10-CM

## 2021-03-21 DIAGNOSIS — E1122 Type 2 diabetes mellitus with diabetic chronic kidney disease: Secondary | ICD-10-CM | POA: Diagnosis present

## 2021-03-21 DIAGNOSIS — D709 Neutropenia, unspecified: Secondary | ICD-10-CM

## 2021-03-21 DIAGNOSIS — D6959 Other secondary thrombocytopenia: Secondary | ICD-10-CM | POA: Diagnosis present

## 2021-03-21 DIAGNOSIS — D631 Anemia in chronic kidney disease: Secondary | ICD-10-CM | POA: Diagnosis present

## 2021-03-21 DIAGNOSIS — R5081 Fever presenting with conditions classified elsewhere: Secondary | ICD-10-CM | POA: Diagnosis not present

## 2021-03-21 DIAGNOSIS — N185 Chronic kidney disease, stage 5: Secondary | ICD-10-CM | POA: Diagnosis not present

## 2021-03-21 DIAGNOSIS — D61818 Other pancytopenia: Secondary | ICD-10-CM | POA: Diagnosis present

## 2021-03-21 DIAGNOSIS — D6481 Anemia due to antineoplastic chemotherapy: Secondary | ICD-10-CM | POA: Diagnosis present

## 2021-03-21 DIAGNOSIS — I120 Hypertensive chronic kidney disease with stage 5 chronic kidney disease or end stage renal disease: Secondary | ICD-10-CM | POA: Diagnosis present

## 2021-03-21 DIAGNOSIS — E559 Vitamin D deficiency, unspecified: Secondary | ICD-10-CM | POA: Diagnosis present

## 2021-03-21 DIAGNOSIS — N179 Acute kidney failure, unspecified: Secondary | ICD-10-CM | POA: Diagnosis present

## 2021-03-21 DIAGNOSIS — J0510 Acute epiglottitis without obstruction: Secondary | ICD-10-CM

## 2021-03-21 DIAGNOSIS — E213 Hyperparathyroidism, unspecified: Secondary | ICD-10-CM | POA: Diagnosis present

## 2021-03-21 DIAGNOSIS — Z992 Dependence on renal dialysis: Secondary | ICD-10-CM | POA: Diagnosis not present

## 2021-03-21 DIAGNOSIS — R1310 Dysphagia, unspecified: Secondary | ICD-10-CM | POA: Diagnosis present

## 2021-03-21 DIAGNOSIS — Z20822 Contact with and (suspected) exposure to covid-19: Secondary | ICD-10-CM | POA: Diagnosis present

## 2021-03-21 DIAGNOSIS — Z9115 Patient's noncompliance with renal dialysis: Secondary | ICD-10-CM | POA: Diagnosis not present

## 2021-03-21 DIAGNOSIS — N2581 Secondary hyperparathyroidism of renal origin: Secondary | ICD-10-CM | POA: Diagnosis present

## 2021-03-21 DIAGNOSIS — M898X9 Other specified disorders of bone, unspecified site: Secondary | ICD-10-CM | POA: Diagnosis present

## 2021-03-21 DIAGNOSIS — H9190 Unspecified hearing loss, unspecified ear: Secondary | ICD-10-CM | POA: Diagnosis present

## 2021-03-21 DIAGNOSIS — N186 End stage renal disease: Secondary | ICD-10-CM | POA: Diagnosis present

## 2021-03-21 DIAGNOSIS — E669 Obesity, unspecified: Secondary | ICD-10-CM | POA: Diagnosis present

## 2021-03-21 DIAGNOSIS — Z806 Family history of leukemia: Secondary | ICD-10-CM | POA: Diagnosis not present

## 2021-03-21 LAB — CBC WITH DIFFERENTIAL/PLATELET
Abs Immature Granulocytes: 0 10*3/uL (ref 0.00–0.07)
Basophils Absolute: 0 10*3/uL (ref 0.0–0.1)
Basophils Relative: 0 %
Eosinophils Absolute: 0 10*3/uL (ref 0.0–0.5)
Eosinophils Relative: 0 %
HCT: 26.6 % — ABNORMAL LOW (ref 36.0–46.0)
Hemoglobin: 9.3 g/dL — ABNORMAL LOW (ref 12.0–15.0)
Immature Granulocytes: 0 %
Lymphocytes Relative: 29 %
Lymphs Abs: 0.1 10*3/uL — ABNORMAL LOW (ref 0.7–4.0)
MCH: 30.5 pg (ref 26.0–34.0)
MCHC: 35 g/dL (ref 30.0–36.0)
MCV: 87.2 fL (ref 80.0–100.0)
Monocytes Absolute: 0 10*3/uL — ABNORMAL LOW (ref 0.1–1.0)
Monocytes Relative: 3 %
Neutro Abs: 0.2 10*3/uL — CL (ref 1.7–7.7)
Neutrophils Relative %: 68 %
Platelets: 58 10*3/uL — ABNORMAL LOW (ref 150–400)
RBC: 3.05 MIL/uL — ABNORMAL LOW (ref 3.87–5.11)
RDW: 13 % (ref 11.5–15.5)
Smear Review: NORMAL
WBC: 0.3 10*3/uL — CL (ref 4.0–10.5)
nRBC: 0 % (ref 0.0–0.2)

## 2021-03-21 LAB — CBC
HCT: 23.5 % — ABNORMAL LOW (ref 36.0–46.0)
Hemoglobin: 7.8 g/dL — ABNORMAL LOW (ref 12.0–15.0)
MCH: 30 pg (ref 26.0–34.0)
MCHC: 33.2 g/dL (ref 30.0–36.0)
MCV: 90.4 fL (ref 80.0–100.0)
Platelets: 68 10*3/uL — ABNORMAL LOW (ref 150–400)
RBC: 2.6 MIL/uL — ABNORMAL LOW (ref 3.87–5.11)
RDW: 13.2 % (ref 11.5–15.5)
WBC: 0.4 10*3/uL — CL (ref 4.0–10.5)
nRBC: 0 % (ref 0.0–0.2)

## 2021-03-21 LAB — BASIC METABOLIC PANEL
Anion gap: 18 — ABNORMAL HIGH (ref 5–15)
BUN: 91 mg/dL — ABNORMAL HIGH (ref 8–23)
CO2: 20 mmol/L — ABNORMAL LOW (ref 22–32)
Calcium: 7.9 mg/dL — ABNORMAL LOW (ref 8.9–10.3)
Chloride: 102 mmol/L (ref 98–111)
Creatinine, Ser: 18.04 mg/dL — ABNORMAL HIGH (ref 0.44–1.00)
GFR, Estimated: 2 mL/min — ABNORMAL LOW (ref >60)
Glucose, Bld: 106 mg/dL — ABNORMAL HIGH (ref 70–99)
Potassium: 4.7 mmol/L (ref 3.5–5.1)
Sodium: 140 mmol/L (ref 135–145)

## 2021-03-21 LAB — PHOSPHORUS: Phosphorus: 7.4 mg/dL — ABNORMAL HIGH (ref 2.5–4.6)

## 2021-03-21 LAB — MRSA NEXT GEN BY PCR, NASAL: MRSA by PCR Next Gen: NOT DETECTED

## 2021-03-21 LAB — GLUCOSE, CAPILLARY
Glucose-Capillary: 107 mg/dL — ABNORMAL HIGH (ref 70–99)
Glucose-Capillary: 101 mg/dL — ABNORMAL HIGH (ref 70–99)

## 2021-03-21 LAB — HEPATITIS B SURFACE ANTIBODY,QUALITATIVE: Hep B S Ab: NONREACTIVE

## 2021-03-21 LAB — HEPATITIS B SURFACE ANTIGEN: Hepatitis B Surface Ag: NONREACTIVE

## 2021-03-21 LAB — MAGNESIUM: Magnesium: 2.2 mg/dL (ref 1.7–2.4)

## 2021-03-21 MED ORDER — ALTEPLASE 2 MG IJ SOLR: 2.0000 mg | Freq: Once | INTRAMUSCULAR | Status: DC | PRN

## 2021-03-21 MED ORDER — VANCOMYCIN VARIABLE DOSE PER UNSTABLE RENAL FUNCTION (PHARMACIST DOSING): Status: DC

## 2021-03-21 MED ORDER — DEXTROSE 5 % IV SOLN
5.0000 mg/kg | INTRAVENOUS | Status: DC
  Filled 2021-03-21: qty 13.7

## 2021-03-21 MED ORDER — SODIUM CHLORIDE 0.9 % IV SOLN: 100.0000 mL | INTRAVENOUS | Status: DC | PRN

## 2021-03-21 MED ORDER — TBO-FILGRASTIM 480 MCG/0.8ML ~~LOC~~ SOSY
480.0000 ug | PREFILLED_SYRINGE | Freq: Every day | SUBCUTANEOUS | Status: DC
  Administered 2021-03-21 – 2021-03-22 (×2): 480 ug via SUBCUTANEOUS
  Filled 2021-03-21 (×2): qty 0.8

## 2021-03-21 MED ORDER — ACYCLOVIR 400 MG PO TABS
400.0000 mg | ORAL_TABLET | Freq: Every day | ORAL | Status: DC
  Filled 2021-03-21: qty 1

## 2021-03-21 MED ORDER — FLUCONAZOLE 100MG IVPB
100.0000 mg | INTRAVENOUS | Status: DC
  Administered 2021-03-21: 100 mg via INTRAVENOUS
  Filled 2021-03-21 (×2): qty 50

## 2021-03-21 MED ORDER — ORAL CARE MOUTH RINSE
15.0000 mL | Freq: Two times a day (BID) | OROMUCOSAL | Status: DC
  Administered 2021-03-21 – 2021-03-22 (×2): 15 mL via OROMUCOSAL

## 2021-03-21 MED ORDER — VANCOMYCIN HCL IN DEXTROSE 1-5 GM/200ML-% IV SOLN
1000.0000 mg | INTRAVENOUS | Status: DC
  Administered 2021-03-22: 1000 mg via INTRAVENOUS
  Filled 2021-03-21: qty 200

## 2021-03-21 MED ORDER — DEXTROSE 5 % IV SOLN
500.0000 mg | INTRAVENOUS | Status: DC
  Administered 2021-03-22: 500 mg via INTRAVENOUS
  Filled 2021-03-21 (×3): qty 10

## 2021-03-21 MED ORDER — LIDOCAINE-PRILOCAINE 2.5-2.5 % EX CREA
1.0000 "application " | TOPICAL_CREAM | CUTANEOUS | Status: DC | PRN
  Filled 2021-03-21: qty 5

## 2021-03-21 MED ORDER — ONDANSETRON HCL 4 MG/2ML IJ SOLN: 4.0000 mg | Freq: Three times a day (TID) | INTRAMUSCULAR | Status: DC | PRN

## 2021-03-21 MED ORDER — DOCUSATE SODIUM 100 MG PO CAPS: 100.0000 mg | ORAL_CAPSULE | Freq: Two times a day (BID) | ORAL | Status: DC | PRN

## 2021-03-21 MED ORDER — POLYETHYLENE GLYCOL 3350 17 G PO PACK: 17.0000 g | PACK | Freq: Every day | ORAL | Status: DC | PRN

## 2021-03-21 MED ORDER — PIPERACILLIN-TAZOBACTAM IN DEX 2-0.25 GM/50ML IV SOLN
2.2500 g | Freq: Three times a day (TID) | INTRAVENOUS | Status: DC
  Administered 2021-03-21 – 2021-03-22 (×4): 2.25 g via INTRAVENOUS
  Filled 2021-03-21 (×7): qty 50

## 2021-03-21 MED ORDER — PENTAFLUOROPROP-TETRAFLUOROETH EX AERO
1.0000 | INHALATION_SPRAY | CUTANEOUS | Status: DC | PRN
Start: 2021-03-21 — End: 2021-03-22
  Filled 2021-03-21: qty 116

## 2021-03-21 MED ORDER — PANTOPRAZOLE SODIUM 40 MG IV SOLR
40.0000 mg | INTRAVENOUS | Status: DC
  Administered 2021-03-21 – 2021-03-22 (×2): 40 mg via INTRAVENOUS
  Filled 2021-03-21 (×2): qty 40

## 2021-03-21 MED ORDER — LIDOCAINE HCL (PF) 1 % IJ SOLN
5.0000 mL | INTRAMUSCULAR | Status: DC | PRN
Start: 2021-03-21 — End: 2021-03-22

## 2021-03-21 MED ORDER — FLUCONAZOLE 40 MG/ML PO SUSR
100.0000 mg | Freq: Every day | ORAL | Status: DC
  Filled 2021-03-21: qty 2.5

## 2021-03-21 MED ORDER — DEXAMETHASONE SODIUM PHOSPHATE 4 MG/ML IJ SOLN
4.0000 mg | Freq: Two times a day (BID) | INTRAMUSCULAR | Status: DC
  Administered 2021-03-21 – 2021-03-22 (×3): 4 mg via INTRAVENOUS
  Filled 2021-03-21 (×3): qty 1

## 2021-03-21 MED ORDER — LABETALOL HCL 5 MG/ML IV SOLN
10.0000 mg | INTRAVENOUS | Status: DC | PRN
  Administered 2021-03-21: 10 mg via INTRAVENOUS
  Filled 2021-03-21: qty 4

## 2021-03-21 MED ORDER — HEPARIN SODIUM (PORCINE) 5000 UNIT/ML IJ SOLN
5000.0000 [IU] | Freq: Three times a day (TID) | INTRAMUSCULAR | Status: DC
  Administered 2021-03-21: 5000 [IU] via SUBCUTANEOUS
  Filled 2021-03-21: qty 1

## 2021-03-21 MED ORDER — CHLORHEXIDINE GLUCONATE CLOTH 2 % EX PADS
6.0000 | MEDICATED_PAD | Freq: Every day | CUTANEOUS | Status: DC
  Administered 2021-03-21 – 2021-03-22 (×2): 6 via TOPICAL

## 2021-03-21 MED ORDER — HEPARIN SODIUM (PORCINE) 1000 UNIT/ML DIALYSIS
1000.0000 [IU] | INTRAMUSCULAR | Status: DC | PRN
  Filled 2021-03-21 (×2): qty 1

## 2021-03-21 MED ORDER — FLUCONAZOLE 40 MG/ML PO SUSR
100.0000 mg | Freq: Every day | ORAL | Status: DC
  Filled 2021-03-21 (×2): qty 2.5

## 2021-03-21 NOTE — Progress Notes (Signed)
Looks okay, will monitor closely.

## 2021-03-21 NOTE — ED Notes (Signed)
Pt's pulse ox sats in upper 80s while on 2L [place]. This RN placed pt on 4L [place], O2 now 96%.

## 2021-03-21 NOTE — Procedures (Signed)
Preop diagnosis: Supraglottitis Postop diagnosis: same Procedure: Transnasal fiberoptic laryngoscopy Surgeon: Redmond Baseman Anesth: Topical with 4% lidocaine Compl: None Findings: Epiglottis is thickened and omega-shaped.  The epiglottis is not obstructing view of the rest of the larynx.  The vocal folds and remainder of the supraglottic larynx are not significantly edematous. Description:  After discussing risks, benefits, and alternatives, the patient was placed in a seated position and the right nasal passage was sprayed with topical anesthetic.  The fiberoptic scope was passed through the right nasal passage to view the pharynx and larynx.  Findings are noted above.  The scope was then removed and she was returned to nursing care in stable condition.

## 2021-03-21 NOTE — Consult Note (Addendum)
[facility] Renal Consultation Note    Indication for Consultation:  Management of ESRD/hemodialysis; anemia, hypertension/volume and secondary hyperparathyroidism  VFI:EPPIRJ, Shanon Brow, MD  HPI: Tracey Stevens is a 61 y.o. 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 Past medical history significant for diffuse large B cell lymphoma, Hx cervical cancer, HTN, DMT2, hyperparathyroidism s/p parathyroidectomy, and lumbar stenosis.   Patient presented to the ED due fever, sore throat and swelling and difficultly swallowing.  States symptoms started a few days ago and continued to worsen so she came in to be evaluated.  Started on Augmentin by PCP on 03/19/20. Missed last dialysis because she was not feeling well. Of note patient is typically complaint with prescribed dialysis regimen. Pertinent findings on work up include WBC 0.4, Hgb 7.8, plt 68, SCr 18.04, BUN 91, Ca 7.9 and phos 7.4; CXR with no acute findings and CT neck with acute pharyngitis/supraglottitis, with involvement of the left epiglottis but no discrete abscess or drainable fluid collection.  Patient admitted to the ICU for further evaluation and management.   Past Medical History:  Diagnosis Date   Adrenal nodule (HCC)    Cancer (HCC)    cervical   CKD (chronic kidney disease) stage 3, GFR 30-59 ml/min (HCC)    Diabetes mellitus without complication ([place])    Diffuse Large B Cell Lymphoma 2015 remission   Hearing loss    Hyperparathyroidism ([place])    s/p parathyroidectomy   Hypertension    Hypertension    Multinodular goiter    Multiple renal cysts    Nephrolithiasis    Vertigo    Vitamin D deficiency    Past Surgical History:  Procedure Laterality Date   ABDOMINAL HYSTERECTOMY     BREAST BIOPSY Right over 10 years ago   benign   Left ear surgery     LIVER BIOPSY  2014   confirmed to be DLBCL   PARATHYROIDECTOMY     Family History  Problem Relation Age of Onset    Heart disease Mother    Hypertension Mother    Gastric cancer Father    Renal Disease Father    Cancer Maternal Grandmother    Leukemia Maternal Grandfather    Cancer Paternal Grandmother    Cancer Paternal Grandfather    Social History:  reports that she has never smoked. She has never used smokeless tobacco. She reports that she does not drink alcohol and does not use drugs. Allergies  Allergen Reactions   Amlodipine Swelling    Peripheral edema, ankles swell Peripheral edema    Prior to Admission medications   Medication Sig Start Date End Date Taking? Authorizing Provider  atorvastatin (LIPITOR) 20 MG tablet Take 20 mg by mouth daily.   Yes [provider]  cholecalciferol (VITAMIN D) 1000 units tablet Take 4,000 Units by mouth daily.    Yes [provider]  gabapentin (NEURONTIN) 300 MG capsule Take 300 mg by mouth daily.   Yes [provider]  losartan (COZAAR) 50 MG tablet Take 50 mg by mouth daily.   Yes [provider]  methocarbamol (ROBAXIN) 500 MG tablet Take 500 mg by mouth daily as needed for muscle spasms. 06/05/20  Yes [provider]  omeprazole (PRILOSEC) 40 MG capsule Take 40 mg by mouth daily. 10/18/20  Yes [provider]  ondansetron (ZOFRAN) 8 MG tablet Take 8 mg by mouth every 8 (eight) hours as needed for vomiting or nausea. 10/04/20  Yes  [provider]   Current Facility-Administered Medications  Medication Dose Route Frequency Provider Last Rate Last Admin   0.9 %  sodium chloride infusion  100 mL Intravenous PRN Penninger, Ria Comment, PA       0.9 %  sodium chloride infusion  100 mL Intravenous PRN Penninger, Ria Comment, PA       alteplase (CATHFLO ACTIVASE) injection 2 mg  2 mg Intracatheter Once PRN Penninger, Ria Comment, PA       Chlorhexidine Gluconate Cloth 2 % PADS 6 each  6 each Topical Q0600 Penninger, Ria Comment, PA   6 each at 03/21/21 1148   dexamethasone (DECADRON) injection 4 mg  4 mg  Intravenous Q12H [place] Crane M, PA-C   4 mg at 03/21/21 9563   docusate sodium (COLACE) capsule 100 mg  100 mg Oral BID PRN Lestine Mount, PA-C       heparin injection 1,000 Units  1,000 Units Dialysis PRN Penninger, Ria Comment, PA       heparin injection 5,000 Units  5,000 Units Subcutaneous Q8H [place] Crane M, PA-C   5,000 Units at 03/21/21 1204   lidocaine (PF) (XYLOCAINE) 1 % injection 5 mL  5 mL Intradermal PRN Penninger, Ria Comment, PA       lidocaine-prilocaine (EMLA) cream 1 application  1 application Topical PRN Penninger, Ria Comment, PA       ondansetron (ZOFRAN) injection 4 mg  4 mg Intravenous Q8H PRN [place] Crane M, PA-C       pantoprazole (PROTONIX) injection 40 mg  40 mg Intravenous Q24H [place] Crane M, PA-C   40 mg at 03/21/21 8756   pentafluoroprop-tetrafluoroeth (GEBAUERS) aerosol 1 application  1 application Topical PRN Penninger, Ria Comment, PA       piperacillin-tazobactam (ZOSYN) IVPB 2.25 g  2.25 g Intravenous Q8H MancherilDarnell Level, RPH 100 mL/hr at 03/21/21 1402 2.25 g at 03/21/21 1402   polyethylene glycol (MIRALAX / GLYCOLAX) packet 17 g  17 g Oral Daily PRN [place] Crane M, PA-C       Tbo-Filgrastim [facility]) injection 480 mcg  480 mcg Subcutaneous Daily Alcario Drought, Jared M, DO   480 mcg at 03/21/21 0329   vancomycin variable dose per unstable renal function (pharmacist dosing)   Does not apply See admin instructions Lavenia Atlas [facility]       Labs: Basic Metabolic Panel: Recent Labs  Lab 03/20/21 2015 03/21/21 0300  NA 139 140  K 4.2 4.7  CL 101 102  CO2 21* 20*  GLUCOSE 98 106*  BUN 90* 91*  CREATININE 18.35* 18.04*  CALCIUM 7.9* 7.9*  PHOS  --  7.4*   Liver Function Tests: Recent Labs  Lab 03/20/21 2015  AST 21  ALT 12  ALKPHOS 77  BILITOT 0.9  PROT 5.5*  ALBUMIN 2.8*   CBC: Recent Labs  Lab 03/20/21 2015 03/21/21 0300  WBC 0.5* 0.4*  NEUTROABS 0.2*  --   HGB 7.9* 7.8*  HCT 23.8* 23.5*  MCV 89.8 90.4  PLT 86* 68*    CBG: Recent Labs  Lab 03/21/21 1145  GLUCAP 107*   Studies/Results: CT Soft Tissue Neck Wo Contrast  Result Date: 03/20/2021 CLINICAL DATA:  Initial evaluation for acute neck/throat pain, epiglottitis or tonsillitis suspected. EXAM: CT NECK WITHOUT CONTRAST TECHNIQUE: Multidetector CT imaging of the neck was performed following the standard protocol without intravenous contrast. COMPARISON:  None available. FINDINGS: Pharynx and larynx: Oral cavity within normal limits. No evidence for acute tonsillitis. The epiglottis is somewhat thickened and edematous, more pronounced on  the left (series 3, image 70). Suggestion of mild mucosal edema within the adjacent oropharyngeal mucosa. Hazy stranding seen within the retropharyngeal space, consistent with edema and/or effusion. Findings concerning for acute pharyngitis/supraglottitis. Oropharyngeal airway remains patent at this time. Hazy stranding extends to involve the bilateral parapharyngeal spaces, with additional inflammatory stranding involving the right greater than left face at the level of the masticator spaces. No visible loculated collections. Glottis itself within normal limits. Subglottic airway clear. Salivary glands: Salivary glands including the parotid and submandibular glands within normal limits. Thyroid: Visualized thyroid normal. Lymph nodes: No enlarged or pathologic adenopathy within the neck. Vascular: Left-sided dialysis catheter partially visualized. Right-sided Port-A-Cath in place. Evaluation of the vascular structures otherwise limited by lack of IV contrast. Minimal for age atheromatous plaque about the left carotid bifurcation. Limited intracranial: Unremarkable. Visualized orbits: Unremarkable. Mastoids and visualized paranasal sinuses: Paranasal sinuses are largely clear. No significant mastoid effusion. Skeleton: Prominent diffuse sclerosis seen about the maxilla and mandible, suspected be related underlying dental disease. No  other discrete or worrisome osseous lesions. Upper chest: Unremarkable. Other: None. IMPRESSION: Findings concerning for acute pharyngitis/supraglottitis, with involvement of the left epiglottis. Associated hazy inflammatory stranding within the retropharyngeal and parapharyngeal spaces, with extension to involve the right greater than left masticator spaces. No discrete abscess or drainable fluid collection seen on this noncontrast examination. Supraglottic airway remains patent at this time. Electronically Signed   By: Jeannine Boga M.D.   On: 03/20/2021 23:20   DG Chest Port 1 View  Result Date: 03/20/2021 CLINICAL DATA:  Questionable sepsis. EXAM: PORTABLE CHEST 1 VIEW COMPARISON:  Chest x-ray 12/11/2020. FINDINGS: Bilateral central venous catheters are present ending in the distal SVC. Heart is mildly enlarged, unchanged. Mediastinal silhouette is within normal limits. No definite lung infiltrate, pleural effusion or pneumothorax identified. No acute fractures are seen. IMPRESSION: 1. No acute cardiopulmonary process. Electronically Signed   By: Ronney Asters M.D.   On: 03/20/2021 21:23    ROS: All others negative except those listed in HPI.  Physical Exam: Vitals:   03/21/21 1100 03/21/21 1147 03/21/21 1200 03/21/21 1230  BP: (!) 139/92   (!) 146/102  Pulse: 99  96 97  Resp: (!) 22  (!) 26   Temp:  98.5 F (36.9 C)    TempSrc:  Oral    SpO2:    96%  Weight:      Height:         General: obese chronically ill appearing female in NAD Head: sclera not icteric, voice muffled, macroglossia, no stridor noted Neck: Supple. No lymphadenopathy Lungs: CTA bilaterally. No wheeze, rales or rhonchi. Breathing is unlabored on 4L via [place] Heart: +tachycardia, No murmur, rubs or gallops.  Abdomen: soft, nontender, +BS, no guarding, no rebound tenderness Lower extremities:trace edema, no ischemic changes, or open wounds  Neuro: AAOx3. Moves all extremities spontaneously. Psych:  Responds to  questions appropriately with a normal affect. Dialysis Access: [facility]  Dialysis Orders:  MWF- [place]  4hrs 74min, BFR 400, DFR 800,  EDW 138kg, 2K/ 2Ca  Access: TDC  Heparin none  Assessment/Plan:  Acute supraglottitis - in ICU to monitor closely for airway compromise.  Vanc and zosyn started. BC pending.  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 Starting Granix per Onc. Recommend transfer to [facility].  Dialysis dependent AKI -  on HD MWF.  Orders written for HD at bedside in ICU today.    Hypertension/volume  - Blood pressure at goal.  Continue home meds.  Anemia of CKD - Hgb 7.8. No ESA d/t malignancy.  Transfuse prn per Heme/Onc.  Secondary Hyperparathyroidism -  Ca at goal. Phos elevated.  Continue renvela when eating.   Nutrition - Renal diet w/fluid restrictions  Jen Mow, PA-C [facility] 03/21/2021, 2:29 PM     Seen and examined independently earlier today.  Agree with note and exam as documented above by physician extender and as noted here.  61 yo female with dialysis-dependent AKI on HD MWF at [place] who presents with neck pain and throat swelling and difficulty swallowing.  At first she thought she might have strep throat.  She was started on augmentin by PCP.  Progressively worsened to the point that she came to ER and was admitted to the ICU. On 4 liters earlier today.  She denies new meds.  CT as above. She is compliant with her HD treatments.   General adult female in bed in no acute distress at rest HEENT normocephalic atraumatic extraocular movements intact sclera anicteric; voice is muffled Neck supple increased neck circumference Lungs clear to auscultation bilaterally normal work of breathing at rest on 3 liters oxygen Heart S1S2 no rub Abdomen soft nontender obese habitus/sl distended Extremities nonpitting edema  Psych normal mood and affect Neuro alert and oriented  Access tunn catheter in place [facility]  Acute  supraglottitis  - per pulm - she is in the ICU - abx per primary team   Dialysis-dependent AKI  - HD MWF - for HD today   Pancytopenia  - setting of malignancy  B ALL - per hem/onc  HTN  - controlled   Anemia renal failure - no ESA in the setting of malignancy   Metabolic bone disease/secondary hyperparathyroidism - on renvela    Claudia Desanctis, MD 03/21/2021 4:18 PM

## 2021-03-21 NOTE — H&P (Addendum)
NAME:  Tracey Stevens, MRN:  [id number], DOB:  16-Sep-1960, LOS: 0 ADMISSION DATE:  03/20/2021, CONSULTATION DATE: 03/21/2021 REFERRING MD: Dr. Matilde Sprang, CHIEF COMPLAINT: Epiglottitis  History of Present Illness:  This is a 61 year old female, past medical history of diffuse large B-cell lymphoma, hypertension, end-stage renal disease on dialysis.Patient was seen by primary care for pharyngitis.  This took place on 03/19/2021.  She also follows with medical oncology at 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  Found to be neutropenic on presentation to the ER difficulty swallowing difficulty speaking and concern for epiglottitis.  Patient also had increasing oxygen requirements now on 4 L nasal cannula pulmonary critical care was consulted for recommendations and management consideration for ICU admission.  ENT has also been consulted.  Pertinent  Medical History   Past Medical History:  Diagnosis Date   Adrenal nodule (HCC)    Cancer (HCC)    cervical   CKD (chronic kidney disease) stage 3, GFR 30-59 ml/min (HCC)    Diabetes mellitus without complication ([place])    Diffuse Large B Cell Lymphoma 2015 remission   Hearing loss    Hyperparathyroidism ([place])    s/p parathyroidectomy   Hypertension    Hypertension    Multinodular goiter    Multiple renal cysts    Nephrolithiasis    Vertigo    Vitamin D deficiency      Significant Hospital Events: Including procedures, antibiotic start and stop dates in addition to other pertinent events   03/21/2021 ENT evaluation  Interim History / Subjective:  Per HPI   Objective   Blood pressure (!) 148/84, pulse 99, temperature 99.9 F (37.7 C), resp. rate (!) 23, height _0  (1.854 m), weight (!) 142 kg, SpO2 96 %.        Intake/Output Summary (Last 24 hours) at 03/21/2021 1105 Last data filed at 03/21/2021 0557 Gross  per 24 hour  Intake 2140 ml  Output --  Net 2140 ml   Filed Weights   03/21/21 0505  Weight: (!) 142 kg    Examination: General: Morbid obese female no acute distress HEENT: MM pink/moist voice is noted to be hoarse Neuro: No focal defects grossly intact CV: Regular rate and rhythm PULM: Diminished throughout mild stridor throat is noted GI: soft, bsx4 active  Extremities: warm/dry, plus lower edema  Skin: no rashes or lesions   Resolved Hospital Problem list     Assessment & Plan:   Acute epiglottitis, concern for potential worsening airway obstruction Evaluated by ENT no need for surgical intervention at this time Remains hoarse Admit to intensive care unit for critical evaluation and monitoring Continue broad-spectrum antibiotics Continue Decadron Be prepared For emergent tracheostomy     B-cell acute lymphoblastic leukemia Neutropenic fever, secondary to above Monitor in intensive care unit  Anemia, thrombocytopenia secondary to above Recent Labs    03/20/21 2015 03/21/21 0300  HGB 7.9* 7.8*  Monitoring intensive care unit  End-stage renal disease on dialysis, recently missed dialysis, volume overloaded Nephrology services been contacted for hemodialysis    Best Practice (right click and "Reselect all SmartList Selections" daily)   Diet/type: NPO DVT prophylaxis: prophylactic heparin  GI prophylaxis: PPI Lines: N/A Foley:  N/A Code Status:  full code Last date of multidisciplinary goals of care discussion [na]  Labs   CBC: Recent Labs  Lab 03/20/21 2015 03/21/21 0300  WBC 0.5* 0.4*  NEUTROABS 0.2*  --   HGB 7.9* 7.8*  HCT 23.8* 23.5*  MCV 89.8 90.4  PLT 86* 68*    Basic Metabolic Panel: Recent Labs  Lab 03/20/21 2015 03/21/21 0300  NA 139 140  K 4.2 4.7  CL 101 102  CO2 21* 20*  GLUCOSE 98 106*  BUN 90* 91*  CREATININE 18.35* 18.04*  CALCIUM 7.9* 7.9*  MG  --  2.2  PHOS  --  7.4*   GFR: Estimated Creatinine Clearance:  5.3 mL/min (A) (by C-G formula based on SCr of 18.04 mg/dL (H)). Recent Labs  Lab 03/20/21 2015 03/21/21 0300  WBC 0.5* 0.4*  LATICACIDVEN 1.1  --     Liver Function Tests: Recent Labs  Lab 03/20/21 2015  AST 21  ALT 12  ALKPHOS 77  BILITOT 0.9  PROT 5.5*  ALBUMIN 2.8*   No results for input(s): LIPASE, AMYLASE in the last 168 hours. No results for input(s): AMMONIA in the last 168 hours.  ABG No results found for: PHART, PCO2ART, PO2ART, HCO3, TCO2, ACIDBASEDEF, O2SAT   Coagulation Profile: Recent Labs  Lab 03/20/21 2015  INR 1.3*    Cardiac Enzymes: No results for input(s): CKTOTAL, CKMB, CKMBINDEX, TROPONINI in the last 168 hours.  HbA1C: No results found for: HGBA1C  CBG: No results for input(s): GLUCAP in the last 168 hours.  Richardson Tracey Lexie Morini ACNP Acute Care Nurse Practitioner [place] Please consult Amion 03/21/2021, 11:05 AM

## 2021-03-21 NOTE — Progress Notes (Signed)
Subjective: No change in symptoms.  Still no breathing difficulty.  Objective: Vital signs in last 24 hours: Temp:  [100 F (37.8 C)-101.4 F (38.6 C)] 100 F (37.8 C) (01/05 2149) Pulse Rate:  [88-130] 105 (01/06 0645) Resp:  [6-30] 22 (01/06 0645) BP: (103-149)/(56-119) 123/66 (01/06 0645) SpO2:  [89 %-99 %] 97 % (01/06 0645) Weight:  [142 kg] 142 kg (01/06 0505) Wt Readings from Last 1 Encounters:  03/21/21 (!) 142 kg    Intake/Output from previous day: 01/05 0701 - 01/06 0700 In: 2140 [IV Piggyback:2140] Out: -  Intake/Output this shift: No intake/output data recorded.  General appearance: alert, cooperative, and no distress Throat: high-pitched voice, no stridor  Recent Labs    03/20/21 2015 03/21/21 0300  WBC 0.5* 0.4*  HGB 7.9* 7.8*  HCT 23.8* 23.5*  PLT 86* 68*    Recent Labs    03/20/21 2015 03/21/21 0300  NA 139 140  K 4.2 4.7  CL 101 102  CO2 21* 20*  GLUCOSE 98 106*  BUN 90* 91*  CREATININE 18.35* 18.04*  CALCIUM 7.9* 7.9*    Medications: I have reviewed the patient's current medications.  Assessment/Plan: Acute supraglottitis and neutropenia  No worsening of symptoms, specifically airway obstruction signs.  Continue medical management without instrumentation of airway.   LOS: 0 days   Melida Quitter 03/21/2021, 7:03 AM

## 2021-03-21 NOTE — Consult Note (Signed)
Reason for Consult:Supraglottitis Referring Physician: ER  Tracey Stevens is an 61 y.o. female.  HPI: 61 year old female with B cell lymphoma and ALL under the care of [facility] currently being treated with chemotherapy, hypertension, and end stage renal disease developed sore throat a few days ago.  She is neutropenic.  She came to the ER today with difficulty swallowing related to her sore throat and has associated nasal congestion and mild cough.  Her voice has changed as well.  She has not had difficulty breathing.  She was started on Augmentin yesterday and has been given vancomycin and Zosyn here.  Consultation was requested for airway evaluation.  Past Medical History:  Diagnosis Date   Adrenal nodule (HCC)    Cancer (HCC)    cervical   CKD (chronic kidney disease) stage 3, GFR 30-59 ml/min (HCC)    Diabetes mellitus without complication ([place])    Diffuse Large B Cell Lymphoma 2015 remission   Hearing loss    Hyperparathyroidism ([place])    s/p parathyroidectomy   Hypertension    Hypertension    Multinodular goiter    Multiple renal cysts    Nephrolithiasis    Vertigo    Vitamin D deficiency     Past Surgical History:  Procedure Laterality Date   ABDOMINAL HYSTERECTOMY     BREAST BIOPSY Right over 10 years ago   benign   Left ear surgery     LIVER BIOPSY  2014   confirmed to be DLBCL   PARATHYROIDECTOMY      Family History  Problem Relation Age of Onset   Heart disease Mother    Hypertension Mother    Gastric cancer Father    Renal Disease Father    Cancer Maternal Grandmother    Leukemia Maternal Grandfather    Cancer Paternal Grandmother    Cancer Paternal Grandfather     Social History:  reports that she has never smoked. She has never used smokeless tobacco. She reports that she does not drink alcohol and does not use drugs.  Allergies:  Allergies  Allergen Reactions   Amlodipine Swelling    Peripheral edema, ankles swell Peripheral edema      Medications: I have reviewed the patient's current medications.  Results for orders placed or performed during the hospital encounter of 03/20/21 (from the past 48 hour(s))  Resp Panel by RT-PCR (Flu A&B, Covid) Peripheral     Status: None   Collection Time: 03/20/21  8:02 PM   Specimen: Peripheral; Nasopharyngeal(NP) swabs in vial transport medium  Result Value Ref Range   SARS Coronavirus 2 by RT PCR NEGATIVE NEGATIVE    Comment: (NOTE) SARS-CoV-2 target nucleic acids are NOT DETECTED.  The SARS-CoV-2 RNA is generally detectable in upper respiratory specimens during the acute phase of infection. The lowest concentration of SARS-CoV-2 viral copies this assay can detect is 138 copies/mL. A negative result does not preclude SARS-Cov-2 infection and should not be used as the sole basis for treatment or other patient management decisions. A negative result may occur with  improper specimen collection/handling, submission of specimen other than nasopharyngeal swab, presence of viral mutation(s) within the areas targeted by this assay, and inadequate number of viral copies(<138 copies/mL). A negative result must be combined with clinical observations, patient history, and epidemiological information. The expected result is Negative.  Fact Sheet for Patients:  EntrepreneurPulse.com.au  Fact Sheet for Healthcare Providers:  IncredibleEmployment.be  This test is no t yet approved or cleared by the [place]  States FDA and  has been authorized for detection and/or diagnosis of SARS-CoV-2 by FDA under an Emergency Use Authorization (EUA). This EUA will remain  in effect (meaning this test can be used) for the duration of the COVID-19 declaration under Section 564(b)(1) of the Act, 21 U.S.C.section 360bbb-3(b)(1), unless the authorization is terminated  or revoked sooner.       Influenza A by PCR NEGATIVE NEGATIVE   Influenza B by PCR NEGATIVE  NEGATIVE    Comment: (NOTE) The Xpert Xpress SARS-CoV-2/FLU/RSV plus assay is intended as an aid in the diagnosis of influenza from Nasopharyngeal swab specimens and should not be used as a sole basis for treatment. Nasal washings and aspirates are unacceptable for Xpert Xpress SARS-CoV-2/FLU/RSV testing.  Fact Sheet for Patients: EntrepreneurPulse.com.au  Fact Sheet for Healthcare Providers: IncredibleEmployment.be  This test is not yet approved or cleared by the [place] FDA and has been authorized for detection and/or diagnosis of SARS-CoV-2 by FDA under an Emergency Use Authorization (EUA). This EUA will remain in effect (meaning this test can be used) for the duration of the COVID-19 declaration under Section 564(b)(1) of the Act, 21 U.S.C. section 360bbb-3(b)(1), unless the authorization is terminated or revoked.  Performed at [facility], [place] [address] [place], [place] [address]   Lactic acid, plasma     Status: None   Collection Time: 03/20/21  8:15 PM  Result Value Ref Range   Lactic Acid, Venous 1.1 0.5 - 1.9 mmol/L    Comment: Performed at [facility] [address], [place], [place] [address]  CBC with Differential     Status: Abnormal   Collection Time: 03/20/21  8:15 PM  Result Value Ref Range   WBC 0.5 (LL) 4.0 - 10.5 K/uL    Comment: REPEATED TO VERIFY THIS CRITICAL RESULT HAS VERIFIED AND BEEN CALLED TO A. DECHAMBEAU, RN BY ENIOLA ADEDOKUN ON 01 05 2023 AT 2111, AND HAS BEEN READ BACK.  THIS CRITICAL RESULT HAS VERIFIED AND BEEN CALLED TO A. DECHAMBEAU, RN BY ENIOLA ADEDOKUN ON 01 05 2023 AT 2112, AND HAS BEEN READ BACK.     RBC 2.65 (L) 3.87 - 5.11 MIL/uL   Hemoglobin 7.9 (L) 12.0 - 15.0 g/dL   HCT 23.8 (L) 36.0 - 46.0 %   MCV 89.8 80.0 - 100.0 fL   MCH 29.8 26.0 - 34.0 pg   MCHC 33.2 30.0 - 36.0 g/dL   RDW 13.1 11.5 - 15.5 %   Platelets 86 (L) 150 - 400 K/uL    Comment: Immature Platelet Fraction  may be clinically indicated, consider ordering this additional test [id number]    nRBC 0.0 0.0 - 0.2 %   Neutrophils Relative % 38 %   Neutro Abs 0.2 (LL) 1.7 - 7.7 K/uL    Comment: REPEATED TO VERIFY THIS CRITICAL RESULT HAS VERIFIED AND BEEN CALLED TO A. DECHAMBEAU, RN BY ENIOLA ADEDOKUN ON 01 05 2023 AT 2115, AND HAS BEEN READ BACK.     Lymphocytes Relative 36 %   Lymphs Abs 0.2 (L) 0.7 - 4.0 K/uL   Monocytes Relative 11 %   Monocytes Absolute 0.1 0.1 - 1.0 K/uL   Eosinophils Relative 4 %   Eosinophils Absolute 0.0 0.0 - 0.5 K/uL   Basophils Relative 0 %   Basophils Absolute 0.0 0.0 - 0.1 K/uL   Immature Granulocytes 11 %   Abs Immature Granulocytes 0.05 0.00 - 0.07 K/uL    Comment: Performed at [facility], [address], [place], [place] [address]  Comprehensive metabolic panel     Status: Abnormal   Collection Time: 03/20/21  8:15 PM  Result Value Ref Range   Sodium 139 135 - 145 mmol/L   Potassium 4.2 3.5 - 5.1 mmol/L   Chloride 101 98 - 111 mmol/L   CO2 21 (L) 22 - 32 mmol/L   Glucose, Bld 98 70 - 99 mg/dL    Comment: Glucose reference range applies only to samples taken after fasting for at least 8 hours.   BUN 90 (H) 8 - 23 mg/dL   Creatinine, Ser 18.35 (H) 0.44 - 1.00 mg/dL   Calcium 7.9 (L) 8.9 - 10.3 mg/dL   Total Protein 5.5 (L) 6.5 - 8.1 g/dL   Albumin 2.8 (L) 3.5 - 5.0 g/dL   AST 21 15 - 41 U/L   ALT 12 0 - 44 U/L   Alkaline Phosphatase 77 38 - 126 U/L   Total Bilirubin 0.9 0.3 - 1.2 mg/dL   GFR, Estimated 2 (L) >60 mL/min    Comment: (NOTE) Calculated using the CKD-EPI Creatinine Equation (2021)    Anion gap 17 (H) 5 - 15    Comment: Performed at [facility], [place] [address], [place], [place] [address]  Protime-INR     Status: Abnormal   Collection Time: 03/20/21  8:15 PM  Result Value Ref Range   Prothrombin Time 16.5 (H) 11.4 - 15.2 seconds   INR 1.3 (H) 0.8 - 1.2    Comment: (NOTE) INR goal varies based on device and disease  states. Performed at [facility], [place] [address], [place], [place] [address]   APTT     Status: Abnormal   Collection Time: 03/20/21  8:15 PM  Result Value Ref Range   aPTT 38 (H) 24 - 36 seconds    Comment:        IF BASELINE aPTT IS ELEVATED, SUGGEST PATIENT RISK ASSESSMENT BE USED TO DETERMINE APPROPRIATE ANTICOAGULANT THERAPY. Performed at [facility], [place] [address] [place], [place] [address]     CT Soft Tissue Neck Wo Contrast  Result Date: 03/20/2021 CLINICAL DATA:  Initial evaluation for acute neck/throat pain, epiglottitis or tonsillitis suspected. EXAM: CT NECK WITHOUT CONTRAST TECHNIQUE: Multidetector CT imaging of the neck was performed following the standard protocol without intravenous contrast. COMPARISON:  None available. FINDINGS: Pharynx and larynx: Oral cavity within normal limits. No evidence for acute tonsillitis. The epiglottis is somewhat thickened and edematous, more pronounced on the left (series 3, image 70). Suggestion of mild mucosal edema within the adjacent oropharyngeal mucosa. Hazy stranding seen within the retropharyngeal space, consistent with edema and/or effusion. Findings concerning for acute pharyngitis/supraglottitis. Oropharyngeal airway remains patent at this time. Hazy stranding extends to involve the bilateral parapharyngeal spaces, with additional inflammatory stranding involving the right greater than left face at the level of the masticator spaces. No visible loculated collections. Glottis itself within normal limits. Subglottic airway clear. Salivary glands: Salivary glands including the parotid and submandibular glands within normal limits. Thyroid: Visualized thyroid normal. Lymph nodes: No enlarged or pathologic adenopathy within the neck. Vascular: Left-sided dialysis catheter partially visualized. Right-sided Port-A-Cath in place. Evaluation of the vascular structures otherwise limited by lack of IV contrast. Minimal for age  atheromatous plaque about the left carotid bifurcation. Limited intracranial: Unremarkable. Visualized orbits: Unremarkable. Mastoids and visualized paranasal sinuses: Paranasal sinuses are largely clear. No significant mastoid effusion. Skeleton: Prominent diffuse sclerosis seen about the  maxilla and mandible, suspected be related underlying dental disease. No other discrete or worrisome osseous lesions. Upper chest: Unremarkable. Other: None. IMPRESSION: Findings concerning for acute pharyngitis/supraglottitis, with involvement of the left epiglottis. Associated hazy inflammatory stranding within the retropharyngeal and parapharyngeal spaces, with extension to involve the right greater than left masticator spaces. No discrete abscess or drainable fluid collection seen on this noncontrast examination. Supraglottic airway remains patent at this time. Electronically Signed   By: Jeannine Boga M.D.   On: 03/20/2021 23:20   DG Chest Port 1 View  Result Date: 03/20/2021 CLINICAL DATA:  Questionable sepsis. EXAM: PORTABLE CHEST 1 VIEW COMPARISON:  Chest x-ray 12/11/2020. FINDINGS: Bilateral central venous catheters are present ending in the distal SVC. Heart is mildly enlarged, unchanged. Mediastinal silhouette is within normal limits. No definite lung infiltrate, pleural effusion or pneumothorax identified. No acute fractures are seen. IMPRESSION: 1. No acute cardiopulmonary process. Electronically Signed   By: Ronney Asters M.D.   On: 03/20/2021 21:23    Review of Systems  HENT:  Positive for sore throat, trouble swallowing and voice change.   All other systems reviewed and are negative. Blood pressure 136/84, pulse (!) 111, temperature 100 F (37.8 C), temperature source Oral, resp. rate (!) 26, SpO2 97 %. Physical Exam Constitutional:      Appearance: Normal appearance. She is obese.  HENT:     Head: Normocephalic and atraumatic.     Right Ear: External ear normal.     Left Ear: External ear  normal.     Nose: Nose normal.     Mouth/Throat:     Mouth: Mucous membranes are moist.     Pharynx: Oropharynx is clear.  Eyes:     Extraocular Movements: Extraocular movements intact.     Conjunctiva/sclera: Conjunctivae normal.     Pupils: Pupils are equal, round, and reactive to light.  Cardiovascular:     Rate and Rhythm: Normal rate.  Pulmonary:     Effort: Pulmonary effort is normal.     Comments: No stridor.  No increased work of breathing.  Voice high-pitched. Musculoskeletal:     Cervical back: Normal range of motion.  Skin:    General: Skin is warm and dry.  Neurological:     General: No focal deficit present.     Mental Status: She is alert and oriented to person, place, and time.  Psychiatric:        Mood and Affect: Mood normal.        Behavior: Behavior normal.        Thought Content: Thought content normal.        Judgment: Judgment normal.    Assessment/Plan: Acute supraglottitis, immunosuppression  Fiberoptic exam of the pharynx and larynx demonstrates epiglottis thickening that is not obstructive.  The vocal folds and remainder of the supraglottic larynx are not particularly edematous.  These findings match the CT findings which I have personally reviewed.  I discussed her case with the ER staff and Critical Care.  I recommended close observation in ICU setting but do not feel that intubation is needed at this time, with concern that the risk is not warranted at this point.  If she starts to demonstrates signs of airway distress, intubation will be performed, preferably in an OR setting.  Melida Quitter 03/21/2021, 1:39 AM

## 2021-03-21 NOTE — ED Notes (Signed)
Assisted to bedpan. Small amount of loose stool noted. Patient cleansed and repositioned.

## 2021-03-21 NOTE — ED Notes (Signed)
ENT at bedside

## 2021-03-21 NOTE — Progress Notes (Signed)
Pharmacy Antibiotic Note  Anahla Bevis is a 61 y.o. female admitted on 03/20/2021 with  Sepsis secondary to epiglotitis .  Pharmacy has been consulted for Zosyn and vancomycin dosing. Of note, patient has a h/o ESRD on   Severely leukopenic and neutropenic.   Plan: -Zosyn 2.25 gm IV Q 8 hours -Vancomycin loading dose already given. Will f/u plans for HD and order maintenance dose accordingly  -Monitor CBC, renal fx, cultures and clinical progress -Vancomycin levels as indicated      Temp (24hrs), Avg:100.7 F (38.2 C), Min:100 F (37.8 C), Max:101.4 F (38.6 C)  Recent Labs  Lab 03/20/21 2015  WBC 0.5*  CREATININE 18.35*  LATICACIDVEN 1.1    CrCl cannot be calculated (Unknown ideal weight.).    Allergies  Allergen Reactions   Amlodipine Swelling    Peripheral edema, ankles swell Peripheral edema     Antimicrobials this admission: Zosyn 1/5 >>  Vancomycin 1/5 >>   Dose adjustments this admission:   Microbiology results: 1/6 BCx:  1/6 UCx:     Thank you for allowing pharmacy to be a part of this patients care.  Albertina Parr, PharmD., BCPS, BCCCP Clinical Pharmacist Please refer to [facility] for unit-specific pharmacist

## 2021-03-21 NOTE — Consult Note (Signed)
Medical Consultation   Tracey Stevens  UKG:[id number]  DOB: 07/03/1960  DOA: 03/20/2021  PCP: Bernerd Limbo, MD   Outpatient Specialists: Heme/onc at [facility]   Requesting physician: Dr. Matilde Sprang  Reason for consultation: Epiglotitis, neutropenia.   History of Present Illness: Tracey Stevens is an 61 y.o. 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  Pt recently underwent salvage chemotherapy at [facility] 12/1-12/29 followed by intrathecal chemotherapy.  Pt did miss last session of dialysis.  Pt presents to ED at [facility] with c/o throat pain, swelling, difficulty swallowing, nasal congestion, mild cough.  No N/V/D.  Started on augmentin yesterday for symptoms.  Despite this symptoms worsened and pt presents to ED.  In the ED: Pt has sepsis with Tm 101.4, tachy 110s, WBC 0.5k with an ANC of 200.  Pt also has anemia (HGB 7.9) and thrombocytopenia (platelets 86).  CT neck demonstrates epiglottitis.    Review of Systems:  ROS As per HPI otherwise 10 point review of systems negative.     Past Medical History: Past Medical History:  Diagnosis Date   Adrenal nodule ([place])    Cancer ([place])    cervical   CKD (chronic kidney disease) stage 3, GFR 30-59 ml/min (HCC)    Diabetes mellitus without complication ([place])    Diffuse Large B Cell Lymphoma 2015 remission   Hearing loss    Hyperparathyroidism ([place])    s/p parathyroidectomy   Hypertension    Hypertension    Multinodular goiter    Multiple renal cysts    Nephrolithiasis    Vertigo    Vitamin D deficiency     Past Surgical History: Past Surgical History:  Procedure Laterality Date   ABDOMINAL HYSTERECTOMY     BREAST BIOPSY Right over 10 years ago   benign   Left ear surgery     LIVER BIOPSY  2014   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 She has never used smokeless tobacco. She reports that she does not drink alcohol and does not use drugs.   Family History: Family History  Problem Relation Age of Onset   Heart disease Mother    Hypertension Mother    Gastric cancer Father    Renal Disease Father    Cancer Maternal Grandmother    Leukemia Maternal Grandfather    Cancer Paternal Grandmother    Cancer Paternal Grandfather       Physical Exam: Vitals:   03/21/21 0045 03/21/21 0055 03/21/21 0100 03/21/21 0115  BP: 128/78  136/89 136/84  Pulse: (!) 112 (!) 113 (!) 112 (!) 111  Resp: (!) 30 (!) 24 (!) 26 (!) 26  Temp:      TempSrc:      SpO2: 98% 98% 98% 97%    Constitutional: Alert and awake, oriented x3, drooling, but no respiratory distress Eyes: PERLA, EOMI, irises appear normal, anicteric sclera,  ENMT: Macroglossia, drooling, voice high-pitched.  No stridor, no increased WOB. Neck: neck appears normal, no masses, normal ROM, no thyromegaly, no JVD  CVS: Tachycardic Respiratory:  clear to auscultation bilaterally, no wheezing, rales or rhonchi. Respiratory effort normal. No accessory muscle use.  Abdomen: soft nontender, nondistended, normal bowel sounds, no hepatosplenomegaly,  no hernias  Musculoskeletal: : no cyanosis, clubbing or edema noted bilaterally Neuro: Cranial nerves II-XII intact, strength, sensation, reflexes Psych: judgement and insight appear normal, stable mood and affect, mental status Skin: no rashes or lesions or ulcers, no induration or nodules   Data reviewed:  I have personally reviewed following labs and imaging studies Labs:  CBC: Recent Labs  Lab 03/20/21 2015  WBC 0.5*  NEUTROABS 0.2*  HGB 7.9*  HCT 23.8*  MCV 89.8  PLT 86*    Basic Metabolic Panel: Recent Labs  Lab 03/20/21 2015  NA 139  K 4.2  CL 101  CO2 21*  GLUCOSE 98  BUN 90*  CREATININE 18.35*  CALCIUM 7.9*   GFR CrCl cannot be calculated (Unknown ideal weight.). Liver  Function Tests: Recent Labs  Lab 03/20/21 2015  AST 21  ALT 12  ALKPHOS 77  BILITOT 0.9  PROT 5.5*  ALBUMIN 2.8*   No results for input(s): LIPASE, AMYLASE in the last 168 hours. No results for input(s): AMMONIA in the last 168 hours. Coagulation profile Recent Labs  Lab 03/20/21 2015  INR 1.3*    Cardiac Enzymes: No results for input(s): CKTOTAL, CKMB, CKMBINDEX, TROPONINI in the last 168 hours. BNP: Invalid input(s): POCBNP CBG: No results for input(s): GLUCAP in the last 168 hours. D-Dimer No results for input(s): DDIMER in the last 72 hours. Hgb A1c No results for input(s): HGBA1C in the last 72 hours. Lipid Profile No results for input(s): CHOL, HDL, LDLCALC, TRIG, CHOLHDL, LDLDIRECT in the last 72 hours. Thyroid function studies No results for input(s): TSH, T4TOTAL, T3FREE, THYROIDAB in the last 72 hours.  Invalid input(s): FREET3 Anemia work up No results for input(s): VITAMINB12, FOLATE, FERRITIN, TIBC, IRON, RETICCTPCT in the last 72 hours. Urinalysis    Component Value Date/Time   COLORURINE YELLOW (A) 12/11/2020 2129   APPEARANCEUR CLEAR (A) 12/11/2020 2129   LABSPEC 1.015 12/11/2020 2129   PHURINE 6.0 12/11/2020 2129   GLUCOSEU NEGATIVE 12/11/2020 2129   HGBUR MODERATE (A) 12/11/2020 2129   BILIRUBINUR NEGATIVE 12/11/2020 2129   KETONESUR NEGATIVE 12/11/2020 2129   PROTEINUR >300 (A) 12/11/2020 2129   NITRITE NEGATIVE 12/11/2020 2129   LEUKOCYTESUR NEGATIVE 12/11/2020 2129     Sepsis Labs Invalid input(s): PROCALCITONIN,  WBC,  LACTICIDVEN Microbiology Recent Results (from the past 240 hour(s))  Resp Panel by RT-PCR (Flu A&B, Covid) Peripheral     Status: None   Collection Time: 03/20/21  8:02 PM   Specimen: Peripheral; Nasopharyngeal(NP) swabs in vial transport medium  Result Value Ref Range Status   SARS Coronavirus 2 by RT PCR NEGATIVE NEGATIVE Final    Comment: (NOTE) SARS-CoV-2 target nucleic acids are NOT DETECTED.  The SARS-CoV-2  RNA is generally detectable in upper respiratory specimens during the acute phase of infection. The lowest concentration of SARS-CoV-2 viral copies this assay can detect is 138 copies/mL. A negative result does not preclude SARS-Cov-2 infection and should not be used as the sole basis for treatment or other patient management decisions. A negative result may occur with  improper specimen collection/handling, submission of specimen other than nasopharyngeal swab, presence of viral mutation(s) within the areas targeted by this assay, and inadequate number of viral copies(<138 copies/mL). A negative result must be combined with clinical observations, patient history, and epidemiological information. The expected result is Negative.  Fact Sheet for Patients:  EntrepreneurPulse.com.au  Fact Sheet for Healthcare Providers:  IncredibleEmployment.be  This test is no t yet approved or cleared by the  [place] Architectural technologist and  has been authorized for detection and/or diagnosis of SARS-CoV-2 by FDA under an Print production planner (EUA). This EUA will remain  in effect (meaning this test can be used) for the duration of the COVID-19 declaration under Section 564(b)(1) of the Act, 21 U.S.C.section 360bbb-3(b)(1), unless the authorization is terminated  or revoked sooner.       Influenza A by PCR NEGATIVE NEGATIVE Final   Influenza B by PCR NEGATIVE NEGATIVE Final    Comment: (NOTE) The Xpert Xpress SARS-CoV-2/FLU/RSV plus assay is intended as an aid in the diagnosis of influenza from Nasopharyngeal swab specimens and should not be used as a sole basis for treatment. Nasal washings and aspirates are unacceptable for Xpert Xpress SARS-CoV-2/FLU/RSV testing.  Fact Sheet for Patients: EntrepreneurPulse.com.au  Fact Sheet for Healthcare Providers: IncredibleEmployment.be  This test is not yet approved or cleared by the  [place] FDA and has been authorized for detection and/or diagnosis of SARS-CoV-2 by FDA under an Emergency Use Authorization (EUA). This EUA will remain in effect (meaning this test can be used) for the duration of the COVID-19 declaration under Section 564(b)(1) of the Act, 21 U.S.C. section 360bbb-3(b)(1), unless the authorization is terminated or revoked.  Performed at [facility], [place] [address] [place], [place] [address]        Inpatient Medications:   Scheduled Meds: Continuous Infusions:  piperacillin-tazobactam (ZOSYN)  IV 3.375 g (03/20/21 2147)   vancomycin 2,500 mg (03/21/21 0043)     Radiological Exams on Admission: CT Soft Tissue Neck Wo Contrast  Result Date: 03/20/2021 CLINICAL DATA:  Initial evaluation for acute neck/throat pain, epiglottitis or tonsillitis suspected. EXAM: CT NECK WITHOUT CONTRAST TECHNIQUE: Multidetector CT imaging of the neck was performed following the standard protocol without intravenous contrast. COMPARISON:  None available. FINDINGS: Pharynx and larynx: Oral cavity within normal limits. No evidence for acute tonsillitis. The epiglottis is somewhat thickened and edematous, more pronounced on the left (series 3, image 70). Suggestion of mild mucosal edema within the adjacent oropharyngeal mucosa. Hazy stranding seen within the retropharyngeal space, consistent with edema and/or effusion. Findings concerning for acute pharyngitis/supraglottitis. Oropharyngeal airway remains patent at this time. Hazy stranding extends to involve the bilateral parapharyngeal spaces, with additional inflammatory stranding involving the right greater than left face at the level of the masticator spaces. No visible loculated collections. Glottis itself within normal limits. Subglottic airway clear. Salivary glands: Salivary glands including the parotid and submandibular glands within normal limits. Thyroid: Visualized thyroid normal. Lymph nodes: No enlarged or  pathologic adenopathy within the neck. Vascular: Left-sided dialysis catheter partially visualized. Right-sided Port-A-Cath in place. Evaluation of the vascular structures otherwise limited by lack of IV contrast. Minimal for age atheromatous plaque about the left carotid bifurcation. Limited intracranial: Unremarkable. Visualized orbits: Unremarkable. Mastoids and visualized paranasal sinuses: Paranasal sinuses are largely clear. No significant mastoid effusion. Skeleton: Prominent diffuse sclerosis seen about the maxilla and mandible, suspected be related underlying dental disease. No other discrete or worrisome osseous lesions. Upper chest: Unremarkable. Other: None. IMPRESSION: Findings concerning for acute pharyngitis/supraglottitis, with involvement of the left epiglottis. Associated hazy inflammatory stranding within the retropharyngeal and parapharyngeal spaces, with extension to involve the right greater than left masticator spaces. No discrete abscess or drainable fluid collection seen on this noncontrast examination. Supraglottic airway remains patent at this time. Electronically Signed   By: Jeannine Boga M.D.   On: 03/20/2021 23:20   DG Chest Port 1 View  Result Date: 03/20/2021  CLINICAL DATA:  Questionable sepsis. EXAM: PORTABLE CHEST 1 VIEW COMPARISON:  Chest x-ray 12/11/2020. FINDINGS: Bilateral central venous catheters are present ending in the distal SVC. Heart is mildly enlarged, unchanged. Mediastinal silhouette is within normal limits. No definite lung infiltrate, pleural effusion or pneumothorax identified. No acute fractures are seen. IMPRESSION: 1. No acute cardiopulmonary process. Electronically Signed   By: Ronney Asters M.D.   On: 03/20/2021 21:23    Impression/Recommendations Principal Problem:   Epiglottitis Active Problems:   CKD (chronic kidney disease) stage 5, GFR less than 15 ml/min (HCC)   ALL (acute lymphoblastic leukemia) (HCC)   Febrile neutropenia (HCC)    Sepsis (HCC)   Epiglottitis with sepsis and febrile neutropenia - Profound neutropenia with ANC = 200 Empiric zosyn + Vanc Got dose of decadron in ED BP stable, HR 110, got 500cc bolus in ED, but will hold off on bolus IVF for the moment in this ESRD pt who has already missed a couple dialysis sessions this past week Most immediate concern at this point is that due to epiglottitis in setting of neutropenia, we are concerned patient is going to swell her airway shut even with Dr. Redmond Baseman seeing pt at bedside Dr. Valeta Harms has seen pt Sounds like plan at the moment per Dr. Redmond Baseman and Dr. Valeta Harms = hold off on intubation, pt going to be admitted to ICU, intubate in OR if needed. Profound neutropenia - Spoke with Dr. Burr Medico who also spoke with Dr. Linus Orn at [facility] onc available to [place] 442mcg daily for neutropenia (I have ordered this). Heme/onc here will also follow Pancytopenia - Anemia and thrombocytopenia also likely due to recent chemotherapy ALL - On wait list at [facility] but no beds at this time, see above ESRD - Missed a couple of dialysis sessions this past week But no emergent dialysis needs at this time: K 4.2, BUN 90, CXR neg. Call Nephrology in AM for IP dialysis during stay.    CRITICAL CARE Performed by: Etta Quill.   Total critical care time: 70 minutes  Critical care time was exclusive of separately billable procedures and treating other patients.  Critical care was necessary to treat or prevent imminent or life-threatening deterioration.  Critical care was time spent personally by me on the following activities: development of treatment plan with patient and/or surrogate as well as nursing, discussions with consultants, evaluation of patient's response to treatment, examination of patient, obtaining history from patient or surrogate, ordering and performing treatments and interventions, ordering and review of laboratory studies, ordering and review of  radiographic studies, pulse oximetry and re-evaluation of patient's condition.  Thomasine Klutts M. D.O. [facility] 03/21/2021, 1:22 AM

## 2021-03-21 NOTE — Progress Notes (Signed)
eLink Physician-Brief Progress Note Patient Name: Tracey Stevens DOB: [birth year] MRN: [id number]   Date of Service  03/21/2021  HPI/Events of Note  Patient with platelet count of 68 K from 3 AM this morning, needs follow up check,  patient with dysphagia and hoarseness and scheduled oral Diflucan and Acyclovir.  eICU Interventions  Stat CBC ordered to follow up platelet count, Diflucan and Acyclovir switched to iv delivery and patient made NPO.        [name] Yandiel Bergum 03/21/2021, 10:27 PM

## 2021-03-21 NOTE — Consult Note (Addendum)
[place]  [phone] [phone]    [place]  Referring MD:  Dr. Jennette Kettle  Reason for Referral: Pancytopenia, B-cell acute lymphoblastic leukemia  HPI: Tracey Stevens is a 61 year old female with a past medical history significant for end-stage renal disease, history of cervical cancer diagnosed in 2003 status post TAH, history of diffuse large B-cell lymphoma diagnosed in 2014 status post 6 cycles of R-CHOP and for doses of intrathecal methotrexate, B-cell acute lymphoblastic leukemia currently followed by Dr. Joan Mayans at [facility].  She presented to the emergency department with throat pain, swelling, difficulty swallowing, nasal congestion, and cough.  Patient met sepsis criteria on admission.  Initial CBC showed a WBC of 0.5, hemoglobin 7.9, platelet count 86,000 ANC 0.2.  Chemistry showed a BUN of 90 and creatinine of 18.35.  Has been seen by PCCM and ENT.  She is being admitted to the ICU for close observation.  She has been started on Granix 480 mcg subcu daily.  Outside records from [place] have been reviewed.  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  She received her third cycle of salvage treatment with Blincyto starting on 02/13/2021.  She was also recently started on intrathecal methotrexate.  Most recent bone marrow biopsy was performed on 01/28/2021 which showed normocellular bone marrow (40%) with myeloid hypoplasia, relative erythroid hyperplasia, and no increase in blasts.  The patient was seen in her hospital room.  Family are at the bedside.  She reports that she has been having fevers at home.  She reports a sore throat and some difficulty swallowing.  She is not having any significant shortness of breath at this time.  Denies headaches, chest pain, abdominal pain, nausea, vomiting.  She has not noted any  bleeding.  States that she was due for dialysis today. Hematology was asked to see the patient to make recommendations regarding her pancytopenia.  Past Medical History:  Diagnosis Date   Adrenal nodule ([place])    Cancer ([place])    cervical   CKD (chronic kidney disease) stage 3, GFR 30-59 ml/min (HCC)    Diabetes mellitus without complication ([place])    Diffuse Large B Cell Lymphoma 2015 remission   Hearing loss    Hyperparathyroidism ([place])    s/p parathyroidectomy   Hypertension    Hypertension    Multinodular goiter    Multiple renal cysts    Nephrolithiasis    Vertigo    Vitamin D deficiency   :   Past Surgical History:  Procedure Laterality Date   ABDOMINAL HYSTERECTOMY     BREAST BIOPSY Right over 10 years ago   benign   Left ear surgery     LIVER BIOPSY  2014   confirmed to be DLBCL   PARATHYROIDECTOMY    :   CURRENT MEDS: Current Facility-Administered Medications  Medication Dose Route Frequency Provider Last Rate Last Admin   0.9 %  sodium chloride infusion  100 mL Intravenous PRN Penninger, Ria Comment, PA       0.9 %  sodium chloride infusion  100 mL Intravenous PRN Penninger, Ria Comment, PA       alteplase (CATHFLO ACTIVASE) injection 2 mg  2 mg Intracatheter Once PRN Penninger, Ria Comment, PA       Chlorhexidine Gluconate Cloth 2 % PADS 6 each  6 each Topical Q0600 Penninger, Lindsay, PA       dexamethasone (DECADRON)  injection 4 mg  4 mg Intravenous Q12H [place] Crane M, PA-C   4 mg at 03/21/21 1610   docusate sodium (COLACE) capsule 100 mg  100 mg Oral BID PRN Lestine Mount, PA-C       heparin injection 1,000 Units  1,000 Units Dialysis PRN Penninger, Ria Comment, PA       heparin injection 5,000 Units  5,000 Units Subcutaneous Q8H Reese, Stephanie M, PA-C       lidocaine (PF) (XYLOCAINE) 1 % injection 5 mL  5 mL Intradermal PRN Penninger, Ria Comment, PA       lidocaine-prilocaine (EMLA) cream 1 application  1 application Topical PRN Penninger, Ria Comment, PA        ondansetron (ZOFRAN) injection 4 mg  4 mg Intravenous Q8H PRN [place] Crane M, PA-C       pantoprazole (PROTONIX) injection 40 mg  40 mg Intravenous Q24H Nevada Crane M, PA-C   40 mg at 03/21/21 9604   pentafluoroprop-tetrafluoroeth (GEBAUERS) aerosol 1 application  1 application Topical PRN Penninger, Ria Comment, PA       piperacillin-tazobactam (ZOSYN) IVPB 2.25 g  2.25 g Intravenous Q8H Lavenia Atlas, RPH   Stopped at 03/21/21 0557   polyethylene glycol (MIRALAX / GLYCOLAX) packet 17 g  17 g Oral Daily PRN [place] Crane M, PA-C       Tbo-Filgrastim [facility]) injection 480 mcg  480 mcg Subcutaneous Daily Jennette Kettle M, DO   480 mcg at 03/21/21 0329   vancomycin variable dose per unstable renal function (pharmacist dosing)   Does not apply See admin instructions Mancheril, Darnell Level, [facility]       Current Outpatient Medications  Medication Sig Dispense Refill   atorvastatin (LIPITOR) 20 MG tablet Take 20 mg by mouth daily.     cholecalciferol (VITAMIN D) 1000 units tablet Take 4,000 Units by mouth daily.      gabapentin (NEURONTIN) 300 MG capsule Take 300 mg by mouth daily.     losartan (COZAAR) 50 MG tablet Take 50 mg by mouth daily.     methocarbamol (ROBAXIN) 500 MG tablet Take 500 mg by mouth daily as needed for muscle spasms.     omeprazole (PRILOSEC) 40 MG capsule Take 40 mg by mouth daily.     ondansetron (ZOFRAN) 8 MG tablet Take 8 mg by mouth every 8 (eight) hours as needed for vomiting or nausea.         Allergies  Allergen Reactions   Amlodipine Swelling    Peripheral edema, ankles swell Peripheral edema   :   Family History  Problem Relation Age of Onset   Heart disease Mother    Hypertension Mother    Gastric cancer Father    Renal Disease Father    Cancer Maternal Grandmother    Leukemia Maternal Grandfather    Cancer Paternal Grandmother    Cancer Paternal Grandfather   :   Social History   Socioeconomic History   Marital status: Single     Spouse name: Not on file   Number of children: 4   Years of education: Not on file   Highest education level: 12th grade  Occupational History   Occupation: disabled  Tobacco Use   Smoking status: Never   Smokeless tobacco: Never  Vaping Use   Vaping Use: Never used  Substance and Sexual Activity   Alcohol use: No   Drug use: No   Sexual activity: Not on file  Other Topics Concern   Not on file  Social  History Narrative   Pt is right handed. She is single, lives alone in a 2nd floor apartment. She drinks 2-3 glasses of tea a day. She is active.   Social Determinants of Health   Financial Resource Strain: Not on file  Food Insecurity: Not on file  Transportation Needs: Not on file  Physical Activity: Not on file  Stress: Not on file  Social Connections: Not on file  Intimate Partner Violence: Not on file  :  REVIEW OF SYSTEMS:  A comprehensive 14 point review of systems was negative except as noted in the HPI.    Exam: Patient Vitals for the past 24 hrs:  BP Temp Temp src Pulse Resp SpO2 Height Weight  03/21/21 1020 -- 99.9 F (37.7 C) -- 99 (!) 23 96 % -- --  03/21/21 1000 (!) 148/84 -- -- 98 (!) 23 98 % -- --  03/21/21 0900 139/75 -- -- (!) 107 20 97 % -- --  03/21/21 0830 125/76 -- -- (!) 101 16 97 % -- --  03/21/21 0815 122/90 -- -- 97 (!) 22 97 % -- --  03/21/21 0645 123/66 -- -- (!) 105 (!) 22 97 % -- --  03/21/21 0630 (!) 140/119 -- -- 88 19 90 % -- --  03/21/21 0615 133/68 -- -- 93 (!) 6 95 % -- --  03/21/21 0600 124/69 -- -- 99 20 97 % -- --  03/21/21 0545 118/63 -- -- 100 (!) 25 97 % -- --  03/21/21 0530 121/78 -- -- 97 (!) 26 96 % -- --  03/21/21 0515 123/85 -- -- 93 (!) 25 93 % -- --  03/21/21 0505 -- -- -- -- -- -- 6' 1"  (1.854 m) (!) 142 kg  03/21/21 0430 137/78 -- -- (!) 103 (!) 25 97 % -- --  03/21/21 0415 131/86 -- -- 98 19 95 % -- --  03/21/21 0400 134/84 -- -- (!) 109 (!) 26 96 % -- --  03/21/21 0330 133/76 -- -- (!) 106 (!) 24 97 % -- --   03/21/21 0315 (!) 144/70 -- -- 100 (!) 22 97 % -- --  03/21/21 0245 (!) 149/95 -- -- (!) 107 (!) 26 94 % -- --  03/21/21 0230 (!) 149/80 -- -- (!) 107 (!) 27 99 % -- --  03/21/21 0215 (!) 144/70 -- -- (!) 107 (!) 21 99 % -- --  03/21/21 0200 (!) 144/99 -- -- (!) 104 (!) 24 95 % -- --  03/21/21 0130 133/85 -- -- (!) 110 (!) 21 98 % -- --  03/21/21 0115 136/84 -- -- (!) 111 (!) 26 97 % -- --  03/21/21 0100 136/89 -- -- (!) 112 (!) 26 98 % -- --  03/21/21 0055 -- -- -- (!) 113 (!) 24 98 % -- --  03/21/21 0045 128/78 -- -- (!) 112 (!) 30 98 % -- --  03/21/21 0015 131/78 -- -- (!) 111 (!) 28 98 % -- --  03/20/21 2330 138/73 -- -- (!) 109 (!) 24 98 % -- --  03/20/21 2325 -- -- -- (!) 110 (!) 24 96 % -- --  03/20/21 2315 129/80 -- -- (!) 120 (!) 26 (!) 89 % -- --  03/20/21 2300 128/77 -- -- (!) 114 (!) 24 96 % -- --  03/20/21 2250 -- -- -- (!) 107 (!) 23 94 % -- --  03/20/21 2230 124/90 -- -- (!) 111 (!) 24 95 % -- --  03/20/21 2149 --  100 F (37.8 C) Oral -- -- -- -- --  03/20/21 2145 120/74 -- -- (!) 113 (!) 22 98 % -- --  03/20/21 2115 (!) 108/56 -- -- (!) 114 16 97 % -- --  03/20/21 2105 109/69 -- -- (!) 122 19 94 % -- --  03/20/21 1951 103/63 (!) 101.4 F (38.6 C) Oral (!) 130 (!) 24 96 % -- --    General:  well-nourished in no acute distress.   Eyes:  no scleral icterus.   ENT: Oral mucosa pink, voice is hoarse/soft-spoken  Lymphatics:  Negative cervical, supraclavicular or axillary adenopathy.   Respiratory: Diminished breath sounds Cardiovascular:  Regular rate and rhythm, S1/S2, without murmur, rub or gallop.  There was trace pedal edema.   GI:  abdomen was soft, flat, nontender, nondistended, without organomegaly.     Skin exam was without ecchymosis, petechiae.   Neuro exam was nonfocal.  Patient was alert and oriented.  Attention was good.   Language was appropriate.  Mood was normal without depression.  Speech was not pressured.  Thought content was not tangential.     LABS:  Lab Results  Component Value Date   WBC 0.4 (LL) 03/21/2021   HGB 7.8 (L) 03/21/2021   HCT 23.5 (L) 03/21/2021   PLT 68 (L) 03/21/2021   GLUCOSE 106 (H) 03/21/2021   ALT 12 03/20/2021   AST 21 03/20/2021   NA 140 03/21/2021   K 4.7 03/21/2021   CL 102 03/21/2021   CREATININE 18.04 (H) 03/21/2021   BUN 91 (H) 03/21/2021   CO2 20 (L) 03/21/2021   INR 1.3 (H) 03/20/2021    CT Soft Tissue Neck Wo Contrast  Result Date: 03/20/2021 CLINICAL DATA:  Initial evaluation for acute neck/throat pain, epiglottitis or tonsillitis suspected. EXAM: CT NECK WITHOUT CONTRAST TECHNIQUE: Multidetector CT imaging of the neck was performed following the standard protocol without intravenous contrast. COMPARISON:  None available. FINDINGS: Pharynx and larynx: Oral cavity within normal limits. No evidence for acute tonsillitis. The epiglottis is somewhat thickened and edematous, more pronounced on the left (series 3, image 70). Suggestion of mild mucosal edema within the adjacent oropharyngeal mucosa. Hazy stranding seen within the retropharyngeal space, consistent with edema and/or effusion. Findings concerning for acute pharyngitis/supraglottitis. Oropharyngeal airway remains patent at this time. Hazy stranding extends to involve the bilateral parapharyngeal spaces, with additional inflammatory stranding involving the right greater than left face at the level of the masticator spaces. No visible loculated collections. Glottis itself within normal limits. Subglottic airway clear. Salivary glands: Salivary glands including the parotid and submandibular glands within normal limits. Thyroid: Visualized thyroid normal. Lymph nodes: No enlarged or pathologic adenopathy within the neck. Vascular: Left-sided dialysis catheter partially visualized. Right-sided Port-A-Cath in place. Evaluation of the vascular structures otherwise limited by lack of IV contrast. Minimal for age atheromatous plaque about the left  carotid bifurcation. Limited intracranial: Unremarkable. Visualized orbits: Unremarkable. Mastoids and visualized paranasal sinuses: Paranasal sinuses are largely clear. No significant mastoid effusion. Skeleton: Prominent diffuse sclerosis seen about the maxilla and mandible, suspected be related underlying dental disease. No other discrete or worrisome osseous lesions. Upper chest: Unremarkable. Other: None. IMPRESSION: Findings concerning for acute pharyngitis/supraglottitis, with involvement of the left epiglottis. Associated hazy inflammatory stranding within the retropharyngeal and parapharyngeal spaces, with extension to involve the right greater than left masticator spaces. No discrete abscess or drainable fluid collection seen on this noncontrast examination. Supraglottic airway remains patent at this time. Electronically Signed   By: Marland Kitchen  Jeannine Boga M.D.   On: 03/20/2021 23:20   DG Chest Port 1 View  Result Date: 03/20/2021 CLINICAL DATA:  Questionable sepsis. EXAM: PORTABLE CHEST 1 VIEW COMPARISON:  Chest x-ray 12/11/2020. FINDINGS: Bilateral central venous catheters are present ending in the distal SVC. Heart is mildly enlarged, unchanged. Mediastinal silhouette is within normal limits. No definite lung infiltrate, pleural effusion or pneumothorax identified. No acute fractures are seen. IMPRESSION: 1. No acute cardiopulmonary process. Electronically Signed   By: Ronney Asters M.D.   On: 03/20/2021 21:23     ASSESSMENT AND PLAN:  1.  Pancytopenia 2.  Neutropenic fever 3.  B-ALL 4.  Acute epiglottitis 5.  End-stage renal disease on dialysis  -Labs from this admission have been reviewed.  We discussed patient with primary leukemia service at [facility]. Linus Orn on call).  Currently no beds available in their facility.  Recommend supportive care. -Daily CBC with differential. -Initiate Granix 480 mcg subcu daily until ANC is 1.0 or higher. -Transfuse PRBCs for hemoglobin less  than 7 and transfuse platelets for platelet count less than 10,000 or active bleeding.  All blood products must be irradiated and CMV negative. -Continue broad-spectrum antibiotics and follow-up on cultures. -The patient would be best [facility] at [facility] given her underlying AML and on active treatment which we have limited experience with.  Would recommend contacting [facility] to see if transfer is possible.  Thank you for this referral.  Mikey Bussing, DNP, AGPCNP-BC, AOCNP  Addendum  I have seen the patient, examined her. I agree with the assessment and and plan and have edited the notes.   61 yo female with ALL, on Blincyto and IT MTX presented with fever and sepsis, likely from epiglottitis. I agree with broad antibiotics, she is on vanco and zosyn, please continue acyclovir and fluconazole (home meds, I will restart). Will give GCSF daily for her severe neutropenia although it may not work very well due to her ALL. Blood transfusion as needed. Consider transferring pt to [facility] when bed is available. We will continue f/u as needed while she is here.   Truitt Merle  03/21/2021

## 2021-03-21 NOTE — H&P (Signed)
NAME:  Tracey Stevens, MRN:  [id number], DOB:  January 17, 1961, LOS: 0 ADMISSION DATE:  03/20/2021, CONSULTATION DATE: 03/21/2021 REFERRING MD: Dr. Matilde Sprang, CHIEF COMPLAINT: Epiglottitis  History of Present Illness:  This is a 61 year old female, past medical history of diffuse large B-cell lymphoma, hypertension, end-stage renal disease on dialysis.Patient was seen by primary care for pharyngitis.  This took place on 03/19/2021.  She also follows with medical oncology at [facility] diagnosis of B-cell acute lymphoblastic leukemia in June 2022 she had a repeat bone marrow biopsy in July 2022 currently undergoing therapy on chemo for this past month.  Found to be neutropenic on presentation to the ER difficulty swallowing difficulty speaking and concern for epiglottitis.  Patient also had increasing oxygen requirements now on 4 L nasal cannula pulmonary critical care was consulted for recommendations and management consideration for ICU admission.  ENT has also been consulted.  Pertinent  Medical History   Past Medical History:  Diagnosis Date   Adrenal nodule (HCC)    Cancer (HCC)    cervical   CKD (chronic kidney disease) stage 3, GFR 30-59 ml/min (HCC)    Diabetes mellitus without complication ([place])    Diffuse Large B Cell Lymphoma 2015 remission   Hearing loss    Hyperparathyroidism ([place])    s/p parathyroidectomy   Hypertension    Hypertension    Multinodular goiter    Multiple renal cysts    Nephrolithiasis    Vertigo    Vitamin D deficiency      Significant Hospital Events: Including procedures, antibiotic start and stop dates in addition to other pertinent events     Interim History / Subjective:  Per HPI   Objective   Blood pressure 136/84, pulse (!) 111, temperature 100 F (37.8 C), temperature source Oral, resp. rate (!) 26, SpO2 97 %.        Intake/Output Summary (Last 24 hours) at 03/21/2021 0138 Last data filed at 03/21/2021 0000 Gross per 24 hour  Intake 500 ml  Output --   Net 500 ml   There were no vitals filed for this visit.  Examination: General: Obese chronically ill-appearing female, HENT: Voice muffled, hot potato Lungs: No wheezing, no stridor Cardiovascular: Regular rhythm S1-S2 Abdomen: Obese pannus, soft nondistended Extremities: Bilateral 3+ lower extremity edema Neuro: Alert oriented following commands GU: Deferred  Resolved Hospital Problem list     Assessment & Plan:   Acute epiglottitis, concern for potential worsening airway obstruction B-cell acute lymphoblastic leukemia Neutropenic fever, secondary to above Anemia, thrombocytopenia secondary to above End-stage renal disease on dialysis, recently missed dialysis, volume overloaded  Plan: Broad-spectrum antibiotics, Zosyn plus vancomycin Blood cultures pending ENT evaluation ICU admission for close observation Any sign of worsening respiratory failure, worsening stridor would need intubation. Put end-tidal CO2 monitoring on her Have all gear at bedside for difficult airway in case of emergency. Decadron 4 mg every 12 hours Consult nephrology for dialysis need   Best Practice (right click and "Reselect all SmartList Selections" daily)   Diet/type: NPO DVT prophylaxis: prophylactic heparin  GI prophylaxis: PPI Lines: N/A Foley:  N/A Code Status:  full code Last date of multidisciplinary goals of care discussion [na]  Labs   CBC: Recent Labs  Lab 03/20/21 2015  WBC 0.5*  NEUTROABS 0.2*  HGB 7.9*  HCT 23.8*  MCV 89.8  PLT 86*    Basic Metabolic Panel: Recent Labs  Lab 03/20/21 2015  NA 139  K 4.2  CL 101  CO2 21*  GLUCOSE 98  BUN 90*  CREATININE 18.35*  CALCIUM 7.9*   GFR: CrCl cannot be calculated (Unknown ideal weight.). Recent Labs  Lab 03/20/21 2015  WBC 0.5*  LATICACIDVEN 1.1    Liver Function Tests: Recent Labs  Lab 03/20/21 2015  AST 21  ALT 12  ALKPHOS 77  BILITOT 0.9  PROT 5.5*  ALBUMIN 2.8*   No results for input(s):  LIPASE, AMYLASE in the last 168 hours. No results for input(s): AMMONIA in the last 168 hours.  ABG No results found for: PHART, PCO2ART, PO2ART, HCO3, TCO2, ACIDBASEDEF, O2SAT   Coagulation Profile: Recent Labs  Lab 03/20/21 2015  INR 1.3*    Cardiac Enzymes: No results for input(s): CKTOTAL, CKMB, CKMBINDEX, TROPONINI in the last 168 hours.  HbA1C: No results found for: HGBA1C  CBG: No results for input(s): GLUCAP in the last 168 hours.  Review of Systems:   Unable to fully obtain as the patient has difficulty speaking She does feel difficulty with swallowing and throat pain  Past Medical History:  She,  has a past medical history of Adrenal nodule ([place]), Cancer ([place]), CKD (chronic kidney disease) stage 3, GFR 30-59 ml/min ([place]), Diabetes mellitus without complication ([place]), Diffuse Large B Cell Lymphoma (2015 remission), Hearing loss, Hyperparathyroidism ([place]), Hypertension, Hypertension, Multinodular goiter, Multiple renal cysts, Nephrolithiasis, Vertigo, and Vitamin D deficiency.   Surgical History:   Past Surgical History:  Procedure Laterality Date   ABDOMINAL HYSTERECTOMY     BREAST BIOPSY Right over 10 years ago   benign   Left ear surgery     LIVER BIOPSY  2014   confirmed to be DLBCL   PARATHYROIDECTOMY       Social History:   reports that she has never smoked. She has never used smokeless tobacco. She reports that she does not drink alcohol and does not use drugs.   Family History:  Her family history includes Cancer in her maternal grandmother, paternal grandfather, and paternal grandmother; Gastric cancer in her father; Heart disease in her mother; Hypertension in her mother; Leukemia in her maternal grandfather; Renal Disease in her father.   Allergies Allergies  Allergen Reactions   Amlodipine Swelling    Peripheral edema, ankles swell Peripheral edema      Home Medications  Prior to Admission medications   Medication Sig Start Date End Date  Taking? Authorizing Provider  atorvastatin (LIPITOR) 20 MG tablet Take 20 mg by mouth daily.   Yes [provider]  cholecalciferol (VITAMIN D) 1000 units tablet Take 4,000 Units by mouth daily.    Yes [provider]  gabapentin (NEURONTIN) 300 MG capsule Take 300 mg by mouth daily.   Yes [provider]  losartan (COZAAR) 50 MG tablet Take 50 mg by mouth daily.   Yes [provider]  methocarbamol (ROBAXIN) 500 MG tablet Take 500 mg by mouth daily as needed for muscle spasms. 06/05/20  Yes [provider]  omeprazole (PRILOSEC) 40 MG capsule Take 40 mg by mouth daily. 10/18/20  Yes [provider]  ondansetron (ZOFRAN) 8 MG tablet Take 8 mg by mouth every 8 (eight) hours as needed for vomiting or nausea. 10/04/20  Yes [provider]     This patient is critically ill with multiple organ system failure; which, requires frequent high complexity decision making, assessment, support, evaluation, and titration of therapies. This was completed through the application of advanced monitoring technologies and extensive interpretation of multiple databases. During this encounter critical care time was  devoted to patient care services described in this note for 35 minutes.   Garner Nash, DO [facility] 03/21/2021 1:38 AM

## 2021-03-21 NOTE — ED Provider Notes (Signed)
Physical Exam  BP 133/85    Pulse (!) 110    Temp 100 F (37.8 C) (Oral)    Resp (!) 21    SpO2 98%   Physical Exam Vitals and nursing note reviewed.  Constitutional:      General: She is not in acute distress.    Appearance: She is well-developed. She is ill-appearing.  HENT:     Head: Normocephalic and atraumatic.     Comments: Oropharyngeal swelling right side greater than left, dry mucous membranes with muffled voice    Left Ear: No swelling.  Eyes:     Conjunctiva/sclera: Conjunctivae normal.  Cardiovascular:     Rate and Rhythm: Normal rate and regular rhythm.     Heart sounds: No murmur heard. Pulmonary:     Effort: Pulmonary effort is normal. No respiratory distress.     Breath sounds: Normal breath sounds.  Abdominal:     Palpations: Abdomen is soft.     Tenderness: There is no abdominal tenderness.  Musculoskeletal:        General: No swelling.     Cervical back: Neck supple.  Skin:    General: Skin is warm and dry.     Capillary Refill: Capillary refill takes less than 2 seconds.  Neurological:     Mental Status: She is alert.  Psychiatric:        Mood and Affect: Mood normal.    Procedures  .Critical Care Performed by: Teressa Lower, MD Authorized by: Teressa Lower, MD   Critical care provider statement:    Critical care time (minutes):  30   Critical care was necessary to treat or prevent imminent or life-threatening deterioration of the following conditions:  Respiratory failure   Critical care was time spent personally by me on the following activities:  Development of treatment plan with patient or surrogate, discussions with consultants, evaluation of patient's response to treatment, examination of patient, ordering and review of laboratory studies, ordering and review of radiographic studies, ordering and performing treatments and interventions, pulse oximetry, re-evaluation of patient's condition and review of old charts  ED Course / MDM    Clinical Course as of 03/21/21 0156  Thu Mar 20, 2021  2309 Pharyngitis, getting admitted, pending CT [MK]    Clinical Course User Index [MK] Teressa Lower, MD   Medical Decision Making  Patient received in handoff.  Neutropenic fever secondary to AL L on active chemo with source as epiglottitis.  Initial plan for medical admission.  While in the emergency department, I discussed with medicine who had appropriate concerns for patient decompensation overnight and very high risk intubation.  As the patient is a leukemia patient, the patient would be best served with her primary oncology team nearby at [facility] in [place].  I spoke with the transfer center at [facility] and the patient will unfortunately be placed on a wait list due to [facility] being at maximum capacity.  Due to the urgency of this matter I also spoke with the emergency department over at [facility] and they are unable to accept the patient as an ED to ED transfer.  Thus, I coordinated with the medicine team here as well as the intensivist team to come up with an airway plan.  ENT was reconsulted who came and evaluated the patient at bedside and performed a bedside scope showing epiglottital thickening, but no swollen cords at this time and as a team we decided to admit the patient to the ICU with a low  threshold to have the patient transferred to the operating room for emergent intubation with anesthesia and ENT.  Patient was then admitted to the ICU.       Dishon Kehoe, Debe Coder, MD 03/21/21 0200

## 2021-03-21 NOTE — Progress Notes (Signed)
Tracey Stevens [id number] Admission Data: 03/21/2021 6:40 PM Attending Provider: Candee Furbish, MD  KGS:[id number], Shanon Brow, MD Consults/ Treatment Team: Treatment Team:  Claudia Desanctis, MD Truitt Merle, MD  Tracey Stevens is a 61 y.o. female patient admitted from ED awake, alert  & orientated  X 3,  Full Code, VSS - Blood pressure (!) 131/92, pulse 83, temperature 98.2 F (36.8 C), temperature source Oral, resp. rate 16, height 6\' 1"  (1.854 m), weight (!) 142 kg, SpO2 98 %., O2   3L nasal cannular, no c/o shortness of breath, no c/o chest pain, no distress noted. Tele # [id number] placed and pt is currently running:normal sinus rhythm.   IV site WDL:  antecubital right, condition patent and no redness and left, condition patent and no redness with a transparent dsg that's clean dry and intact.  Pt orientation to unit, room and routine. Information packet given to patient/family and safety video watched.  Admission INP armband ID verified with patient/family, and in place. SR up x 2, fall risk assessment complete with Patient and family verbalizing understanding of risks associated with falls. Pt verbalizes an understanding of how to use the call bell and to call for help before getting out of bed.  Skin, clean-dry- intact without evidence of bruising, or skin tears.   No evidence of skin break down noted on exam.     Will cont to monitor and assist as needed.  Hosie Spangle, [place] 03/21/2021

## 2021-03-22 ENCOUNTER — Inpatient Hospital Stay (HOSPITAL_COMMUNITY): Payer: Medicare HMO

## 2021-03-22 DIAGNOSIS — J0510 Acute epiglottitis without obstruction: Secondary | ICD-10-CM | POA: Diagnosis not present

## 2021-03-22 LAB — CBC WITH DIFFERENTIAL/PLATELET
Abs Immature Granulocytes: 0 K/uL (ref 0.00–0.07)
Basophils Absolute: 0 K/uL (ref 0.0–0.1)
Basophils Relative: 0 %
Eosinophils Absolute: 0 K/uL (ref 0.0–0.5)
Eosinophils Relative: 0 %
HCT: 23.3 % — ABNORMAL LOW (ref 36.0–46.0)
Hemoglobin: 8 g/dL — ABNORMAL LOW (ref 12.0–15.0)
Immature Granulocytes: 0 %
Lymphocytes Relative: 33 %
Lymphs Abs: 0.1 K/uL — ABNORMAL LOW (ref 0.7–4.0)
MCH: 30.3 pg (ref 26.0–34.0)
MCHC: 34.3 g/dL (ref 30.0–36.0)
MCV: 88.3 fL (ref 80.0–100.0)
Monocytes Absolute: 0 K/uL — ABNORMAL LOW (ref 0.1–1.0)
Monocytes Relative: 3 %
Neutro Abs: 0.2 K/uL — CL (ref 1.7–7.7)
Neutrophils Relative %: 64 %
Platelets: 45 K/uL — ABNORMAL LOW (ref 150–400)
RBC: 2.64 MIL/uL — ABNORMAL LOW (ref 3.87–5.11)
RDW: 13 % (ref 11.5–15.5)
WBC: 0.3 K/uL — CL (ref 4.0–10.5)
nRBC: 0 % (ref 0.0–0.2)

## 2021-03-22 LAB — BASIC METABOLIC PANEL WITH GFR
Anion gap: 17 — ABNORMAL HIGH (ref 5–15)
BUN: 60 mg/dL — ABNORMAL HIGH (ref 8–23)
CO2: 22 mmol/L (ref 22–32)
Calcium: 8.4 mg/dL — ABNORMAL LOW (ref 8.9–10.3)
Chloride: 103 mmol/L (ref 98–111)
Creatinine, Ser: 11.77 mg/dL — ABNORMAL HIGH (ref 0.44–1.00)
GFR, Estimated: 3 mL/min — ABNORMAL LOW
Glucose, Bld: 103 mg/dL — ABNORMAL HIGH (ref 70–99)
Potassium: 3.7 mmol/L (ref 3.5–5.1)
Sodium: 142 mmol/L (ref 135–145)

## 2021-03-22 LAB — HEPATITIS B SURFACE ANTIBODY, QUANTITATIVE: Hep B S AB Quant (Post): 6 m[IU]/mL — ABNORMAL LOW (ref >9.9)

## 2021-03-22 LAB — MAGNESIUM: Magnesium: 2 mg/dL (ref 1.7–2.4)

## 2021-03-22 LAB — RESP PANEL BY RT-PCR (FLU A&B, COVID) ARPGX2
Influenza A by PCR: NEGATIVE
Influenza B by PCR: NEGATIVE
SARS Coronavirus 2 by RT PCR: NEGATIVE

## 2021-03-22 LAB — PHOSPHORUS: Phosphorus: 6.8 mg/dL — ABNORMAL HIGH (ref 2.5–4.6)

## 2021-03-22 LAB — GLUCOSE, CAPILLARY: Glucose-Capillary: 99 mg/dL (ref 70–99)

## 2021-03-22 MED ORDER — MAGIC MOUTHWASH W/LIDOCAINE: 5.0000 mL | Freq: Three times a day (TID) | ORAL | 0 refills | Status: DC | PRN

## 2021-03-22 MED ORDER — TBO-FILGRASTIM 480 MCG/0.8ML ~~LOC~~ SOSY: 480.0000 ug | PREFILLED_SYRINGE | Freq: Every day | SUBCUTANEOUS | 0 refills | Status: DC

## 2021-03-22 MED ORDER — PANTOPRAZOLE SODIUM 40 MG IV SOLR: 40.0000 mg | INTRAVENOUS | Status: DC

## 2021-03-22 MED ORDER — ORAL CARE MOUTH RINSE: 15.0000 mL | Freq: Two times a day (BID) | OROMUCOSAL | 0 refills | Status: DC

## 2021-03-22 MED ORDER — PENTAFLUOROPROP-TETRAFLUOROETH EX AERO: 1.0000 "application " | INHALATION_SPRAY | CUTANEOUS | 0 refills | Status: DC | PRN

## 2021-03-22 MED ORDER — MAGIC MOUTHWASH W/LIDOCAINE
5.0000 mL | Freq: Three times a day (TID) | ORAL | Status: DC | PRN
  Administered 2021-03-22: 5 mL via ORAL
  Filled 2021-03-22 (×2): qty 5

## 2021-03-22 MED ORDER — DOCUSATE SODIUM 100 MG PO CAPS: 100.0000 mg | ORAL_CAPSULE | Freq: Two times a day (BID) | ORAL | 0 refills | Status: DC | PRN

## 2021-03-22 MED ORDER — LIDOCAINE-PRILOCAINE 2.5-2.5 % EX CREA: 1.0000 "application " | TOPICAL_CREAM | CUTANEOUS | 0 refills | Status: DC | PRN

## 2021-03-22 MED ORDER — LIDOCAINE HCL (PF) 1 % IJ SOLN: 5.0000 mL | INTRAMUSCULAR | 0 refills | Status: DC | PRN

## 2021-03-22 MED ORDER — FLUCONAZOLE 100MG IVPB: 100.0000 mg | INTRAVENOUS | Status: DC

## 2021-03-22 MED ORDER — POLYETHYLENE GLYCOL 3350 17 G PO PACK: 17.0000 g | PACK | Freq: Every day | ORAL | 0 refills | Status: DC | PRN

## 2021-03-22 MED ORDER — DEXAMETHASONE SODIUM PHOSPHATE 4 MG/ML IJ SOLN: 4.0000 mg | Freq: Two times a day (BID) | INTRAMUSCULAR | Status: DC

## 2021-03-22 MED ORDER — LABETALOL HCL 5 MG/ML IV SOLN: 10.0000 mg | INTRAVENOUS | Status: DC | PRN

## 2021-03-22 MED ORDER — PIPERACILLIN-TAZOBACTAM IN DEX 2-0.25 GM/50ML IV SOLN: 2.2500 g | Freq: Three times a day (TID) | INTRAVENOUS | Status: DC

## 2021-03-22 MED ORDER — VANCOMYCIN HCL IN DEXTROSE 1-5 GM/200ML-% IV SOLN: 1000.0000 mg | Freq: Once | INTRAVENOUS | 0 refills | Status: AC

## 2021-03-22 MED ORDER — ONDANSETRON HCL 4 MG/2ML IJ SOLN: 4.0000 mg | Freq: Three times a day (TID) | INTRAMUSCULAR | 0 refills | Status: DC | PRN

## 2021-03-22 MED ORDER — DEXTROSE 5 % IV SOLN: 500.0000 mg | INTRAVENOUS | Status: DC

## 2021-03-22 NOTE — Discharge Summary (Addendum)
Physician Discharge Summary  Patient ID: Tracey Stevens MRN: [id number] DOB/AGE: December 04, 1960 61 y.o.  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  This took place on 03/19/2021.  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  Found to be neutropenic on presentation to the ER difficulty swallowing difficulty speaking and concern for epiglottitis.  Patient also had increasing oxygen requirements now on 4 L nasal cannula pulmonary critical care was consulted for recommendations and management consideration for ICU admission.  ENT has also been consulted.  ENT performed laryngoscopy showing expected epiglottic swelling but no laryngeal encroachment.  Monitored in ICU for anticipation of difficult airway while [facility] found an ICU bed for her.  Had HD 1/6 PM.  She has slightly worsening pancytopenia.  Being maintained on broad spectrum abx, steroids, GCSF as below.  Pharmacy dosing vancomycin.  Stable without airway for transfer to [facility] for ongoing management.  Consults:  ENT, Nephrology, Oncology  Significant Diagnostic Studies:  Available in care everywhere  Treatments:  HD on 03/21/21  Discharge Exam: Blood pressure 106/63, pulse 81, temperature 97.8 F (36.6 C), temperature source Oral, resp. rate 17, height 6'  1" (1.854 m), weight (!) 136.5 kg, SpO2 97 %. No resp distress Garbled speech Seems to be handling secretions Lungs clear, heart sounds regular L tunneled HD catheter in place Moves all 4 ext to command  Disposition: Transfer to [place] for ongoing care as she received her oncologic care at that center.  Dr Kathie Rhodes has accepted to [place] ICU.   Allergies as of 03/22/2021       Reactions   Amlodipine Swelling   Peripheral edema, ankles swell Peripheral edema        Medication List     STOP taking these medications    atorvastatin 20 MG tablet Commonly known as: LIPITOR   cholecalciferol 25 MCG (1000 UNIT) tablet Commonly known as: VITAMIN D   gabapentin 300 MG capsule Commonly known as: NEURONTIN   losartan 50 MG tablet Commonly known as: COZAAR   methocarbamol 500 MG tablet Commonly known as: ROBAXIN   omeprazole 40 MG capsule Commonly known as: PRILOSEC   ondansetron 8 MG tablet Commonly known as: ZOFRAN Replaced by: ondansetron 4 MG/2ML Soln injection       TAKE these medications    acyclovir 500 mg in dextrose 5 % 100 mL Inject 500 mg into the vein daily.   dexamethasone 4 MG/ML injection Commonly known as: DECADRON Inject 1 mL (4 mg total) into the vein every 12 (twelve) hours.   docusate sodium 100 MG capsule Commonly known as: COLACE Take 1 capsule (100 mg total) by mouth 2 (two) times daily as needed for mild constipation.   fluconazole 2 mg/mL Soln IVPB Commonly known as: DIFLUCAN Inject 50 mLs (100 mg total) into the vein daily.   labetalol 5 MG/ML  injection Commonly known as: NORMODYNE Inject 2 mLs (10 mg total) into the vein every 2 (two) hours as needed (SBP > 180 or DBP > 100; HR must be > 70 to administer).   lidocaine (PF) 1 % Soln injection Commonly known as: XYLOCAINE Inject 5 mLs into the skin as needed (topical anesthesia for hemodialysis if GEBAUERS is ineffective.).   lidocaine-prilocaine cream Commonly known as:  EMLA Apply 1 application topically as needed (topical anesthesia for hemodialysis if Gebauers and Lidocaine injection are ineffective.).   mouth rinse Liqd solution 15 mLs by Mouth Rinse route 2 (two) times daily.   ondansetron 4 MG/2ML Soln injection Commonly known as: ZOFRAN Inject 2 mLs (4 mg total) into the vein every 8 (eight) hours as needed for nausea or vomiting. Replaces: ondansetron 8 MG tablet   pantoprazole 40 MG injection Commonly known as: PROTONIX Inject 40 mg into the vein daily. Start taking on: March 23, 2021   pentafluoroprop-tetrafluoroeth Aero Commonly known as: GEBAUERS Apply 1 application topically as needed (topical anesthesia for hemodialysis).   piperacillin-tazobactam 2-0.25 GM/50ML IVPB Commonly known as: ZOSYN Inject 50 mLs (2.25 g total) into the vein every 8 (eight) hours.   polyethylene glycol 17 g packet Commonly known as: MIRALAX / GLYCOLAX Take 17 g by mouth daily as needed for moderate constipation.   Tbo-Filgrastim 480 MCG/0.8ML Sosy injection Commonly known as: GRANIX Inject 0.8 mLs (480 mcg total) into the skin daily at 6 (six) AM.   vancomycin 1-5 GM/200ML-% Soln Commonly known as: VANCOCIN Inject 200 mLs (1,000 mg total) into the vein once for 1 dose.       45 min spent arranging DC.  Signed: Candee Furbish 03/22/2021, 8:44 AM

## 2021-03-22 NOTE — Progress Notes (Signed)
[facility] Progress Note  Name: Tracey Stevens MRN: [id number] DOB: 1960/04/03  Chief Complaint:  Throat swelling   Subjective:  She had HD overnight 1/6 pm with 3 kg UF.  She states she's not  having trouble swallowing her oral secretions.  Denies any dizziness or cramping.   Review of systems:  Denies shortness of breath or chest pain  No nausea or vomiting; has been NPO per order  --------------  Background on consult:   Tracey Stevens is a 61 y.o. female with dialysis dependent AKI on HD MWF at [facility], first starting in 12/2020. Past medical history significant for diffuse large B cell lymphoma, Hx cervical cancer, HTN, DMT2, hyperparathyroidism s/p parathyroidectomy, and lumbar stenosis.  Patient presented to the ED due fever, sore throat and swelling and difficultly swallowing.  States symptoms started a few days ago and continued to worsen so she came in to be evaluated.  Started on Augmentin by PCP on 03/19/20. Missed last dialysis because she was not feeling well. Of note patient is typically complaint with prescribed dialysis regimen. Pertinent findings on work up include WBC 0.4, Hgb 7.8, plt 68, SCr 18.04, BUN 91, Ca 7.9 and phos 7.4; CXR with no acute findings and CT neck with acute pharyngitis/supraglottitis, with involvement of the left epiglottis but no discrete abscess or drainable fluid collection.  Patient admitted to the ICU for further evaluation and management.    Intake/Output Summary (Last 24 hours) at 03/22/2021 0714 Last data filed at 03/22/2021 0500 Gross per 24 hour  Intake 460.12 ml  Output 3001 ml  Net -2540.88 ml    Vitals:  Vitals:   03/22/21 0300 03/22/21 0400 03/22/21 0410 03/22/21 0500  BP: 108/61 106/63    Pulse: 90 81    Resp: 18 17    Temp:   (!) 97.5 F (36.4 C)   TempSrc:   Oral   SpO2: 98% 97%    Weight:    (!) 136.5 kg  Height:         Physical Exam:  General adult female in bed in no acute distress at rest HEENT  normocephalic atraumatic extraocular movements intact sclera anicteric; voice is muffled Neck supple increased neck circumference Lungs clear to auscultation bilaterally normal work of breathing at rest on 3 liters oxygen at 97% Heart S1S2 no rub Abdomen soft nontender obese habitus/sl distended Extremities no pitting edema  Psych normal mood and affect Neuro alert and oriented x 3 provides hx and follows commands Access tunn catheter in place [facility]  Medications reviewed   Labs:  BMP Latest Ref Rng & Units 03/22/2021 03/21/2021 03/20/2021  Glucose 70 - 99 mg/dL 103(H) 106(H) 98  BUN 8 - 23 mg/dL 60(H) 91(H) 90(H)  Creatinine 0.44 - 1.00 mg/dL 11.77(H) 18.04(H) 18.35(H)  Sodium 135 - 145 mmol/L 142 140 139  Potassium 3.5 - 5.1 mmol/L 3.7 4.7 4.2  Chloride 98 - 111 mmol/L 103 102 101  CO2 22 - 32 mmol/L 22 20(L) 21(L)  Calcium 8.9 - 10.3 mg/dL 8.4(L) 7.9(L) 7.9(L)     Assessment/Plan:   # Acute supraglottitis  - per pulm - she is in the ICU for monitoring of airway - abx per primary team    # Dialysis-dependent AKI  - HD MWF schedule - plan for next tx on 1/9 - no acute indication for HD today    # Pancytopenia  - setting of malignancy - noted she was started on granix per oncology.     #  B ALL - per hem/onc - per onc consider transferring to [facility] once bed is available   # HTN  - controlled    # Anemia renal failure - no ESA in the setting of malignancy    # Metabolic bone disease/secondary hyperparathyroidism - on renvela normally but not eating.   Claudia Desanctis, MD 03/22/2021 7:29 AM

## 2021-03-22 NOTE — Progress Notes (Signed)
eLink Physician-Brief Progress Note Patient Name: Tracey Stevens DOB: 23-Dec-1960 MRN: [id number]   Date of Service  03/22/2021  HPI/Events of Note  Repeat platelet count is 58 K.  eICU Interventions  Will continue Heparin prophylaxis for now and consider discontinuing if platelet count < 50 K or there is evidence of bleeding.        [name] Leopold Smyers 03/22/2021, 12:26 AM

## 2021-03-22 NOTE — Progress Notes (Signed)
Report called to Lilia Pro, nurse accepting patient on oncology ICU at [facility].

## 2021-03-22 NOTE — Progress Notes (Signed)
ENT Progress Note: HD # 2   Subjective: Improved ST, no respiratory/airway issues  Objective: Vital signs in last 24 hours: Temp:  [97.5 F (36.4 C)-99.9 F (37.7 C)] 97.8 F (36.6 C) (01/07 0712) Pulse Rate:  [81-99] 81 (01/07 0400) Resp:  [13-30] 17 (01/07 0400) BP: (90-159)/(53-128) 106/63 (01/07 0400) SpO2:  [95 %-100 %] 97 % (01/07 0400) Weight:  [133.5 kg-136.6 kg] 136.5 kg (01/07 0500) Weight change: -5.376 kg Last BM Date: 03/21/21  Intake/Output from previous day: 01/06 0701 - 01/07 0700 In: 460.1 [IV Piggyback:460.1] Out: 3001 [Stool:1] Intake/Output this shift: No intake/output data recorded.  Labs: Recent Labs    03/21/21 2216 03/22/21 0212  WBC 0.3* 0.3*  HGB 9.3* 8.0*  HCT 26.6* 23.3*  PLT 58* 45*   Recent Labs    03/21/21 0300 03/22/21 0212  NA 140 142  K 4.7 3.7  CL 102 103  CO2 20* 22  GLUCOSE 106* 103*  BUN 91* 60*  CALCIUM 7.9* 8.4*    Studies/Results: CT Soft Tissue Neck Wo Contrast  Result Date: 03/20/2021 CLINICAL DATA:  Initial evaluation for acute neck/throat pain, epiglottitis or tonsillitis suspected. EXAM: CT NECK WITHOUT CONTRAST TECHNIQUE: Multidetector CT imaging of the neck was performed following the standard protocol without intravenous contrast. COMPARISON:  None available. FINDINGS: Pharynx and larynx: Oral cavity within normal limits. No evidence for acute tonsillitis. The epiglottis is somewhat thickened and edematous, more pronounced on the left (series 3, image 70). Suggestion of mild mucosal edema within the adjacent oropharyngeal mucosa. Hazy stranding seen within the retropharyngeal space, consistent with edema and/or effusion. Findings concerning for acute pharyngitis/supraglottitis. Oropharyngeal airway remains patent at this time. Hazy stranding extends to involve the bilateral parapharyngeal spaces, with additional inflammatory stranding involving the right greater than left face at the level of the masticator  spaces. No visible loculated collections. Glottis itself within normal limits. Subglottic airway clear. Salivary glands: Salivary glands including the parotid and submandibular glands within normal limits. Thyroid: Visualized thyroid normal. Lymph nodes: No enlarged or pathologic adenopathy within the neck. Vascular: Left-sided dialysis catheter partially visualized. Right-sided Port-A-Cath in place. Evaluation of the vascular structures otherwise limited by lack of IV contrast. Minimal for age atheromatous plaque about the left carotid bifurcation. Limited intracranial: Unremarkable. Visualized orbits: Unremarkable. Mastoids and visualized paranasal sinuses: Paranasal sinuses are largely clear. No significant mastoid effusion. Skeleton: Prominent diffuse sclerosis seen about the maxilla and mandible, suspected be related underlying dental disease. No other discrete or worrisome osseous lesions. Upper chest: Unremarkable. Other: None. IMPRESSION: Findings concerning for acute pharyngitis/supraglottitis, with involvement of the left epiglottis. Associated hazy inflammatory stranding within the retropharyngeal and parapharyngeal spaces, with extension to involve the right greater than left masticator spaces. No discrete abscess or drainable fluid collection seen on this noncontrast examination. Supraglottic airway remains patent at this time. Electronically Signed   By: Jeannine Boga M.D.   On: 03/20/2021 23:20   DG Chest Port 1 View  Result Date: 03/22/2021 CLINICAL DATA:  Abnormal respiration. EXAM: PORTABLE CHEST 1 VIEW COMPARISON:  03/20/2021 FINDINGS: 0420 hours. Rightward patient rotation. The cardio pericardial silhouette is enlarged. Low lung volumes. There is pulmonary vascular congestion without overt pulmonary edema. Right Port-A-Cath again noted. Left IJ central line tip overlies the distal SVC level. IMPRESSION: Low volume rotated film with cardiomegaly and vascular congestion. Electronically  Signed   By: Misty Stanley M.D.   On: 03/22/2021 08:02   DG Chest Port 1 View  Result Date: 03/20/2021 CLINICAL  DATA:  Questionable sepsis. EXAM: PORTABLE CHEST 1 VIEW COMPARISON:  Chest x-ray 12/11/2020. FINDINGS: Bilateral central venous catheters are present ending in the distal SVC. Heart is mildly enlarged, unchanged. Mediastinal silhouette is within normal limits. No definite lung infiltrate, pleural effusion or pneumothorax identified. No acute fractures are seen. IMPRESSION: 1. No acute cardiopulmonary process. Electronically Signed   By: Ronney Asters M.D.   On: 03/20/2021 21:23     PHYSICAL EXAM: none   Assessment/Plan: Pt stable F/U on ENT eval from 1/06 - no progression in airway concerns. Cont muffled voice. No further intervention at this time Plan transfer to [facility] oncology today     Jerrell Belfast 03/22/2021, 9:28 AM

## 2021-03-22 NOTE — Progress Notes (Signed)
eLink Physician-Brief Progress Note Patient Name: Tracey Stevens DOB: Oct 03, 1960 MRN: [id number]   Date of Service  03/22/2021  HPI/Events of Note  Platelet count down to 45 K.  eICU Interventions  SQ Heparin order for DVT prophylaxis discontinued. SCD's ordered.        [name] [name] 03/22/2021, 4:23 AM

## 2021-03-25 LAB — CULTURE, BLOOD (ROUTINE X 2)
Culture: NO GROWTH
Culture: NO GROWTH

## 2021-03-25 LAB — PATHOLOGIST SMEAR REVIEW

## 2021-04-15 DIAGNOSIS — M79671 Pain in right foot: Secondary | ICD-10-CM | POA: Insufficient documentation

## 2021-05-12 ENCOUNTER — Other Ambulatory Visit: Payer: Self-pay

## 2021-05-12 DIAGNOSIS — Z1211 Encounter for screening for malignant neoplasm of colon: Secondary | ICD-10-CM | POA: Insufficient documentation

## 2021-05-12 DIAGNOSIS — E669 Obesity, unspecified: Secondary | ICD-10-CM | POA: Insufficient documentation

## 2021-05-12 DIAGNOSIS — M4850XD Collapsed vertebra, not elsewhere classified, site unspecified, subsequent encounter for fracture with routine healing: Secondary | ICD-10-CM | POA: Insufficient documentation

## 2021-05-12 DIAGNOSIS — Z8639 Personal history of other endocrine, nutritional and metabolic disease: Secondary | ICD-10-CM | POA: Insufficient documentation

## 2021-05-12 DIAGNOSIS — N186 End stage renal disease: Secondary | ICD-10-CM

## 2021-05-12 DIAGNOSIS — I700 Atherosclerosis of aorta: Secondary | ICD-10-CM | POA: Insufficient documentation

## 2021-05-12 DIAGNOSIS — E279 Disorder of adrenal gland, unspecified: Secondary | ICD-10-CM | POA: Insufficient documentation

## 2021-05-20 ENCOUNTER — Other Ambulatory Visit: Payer: Self-pay

## 2021-05-20 ENCOUNTER — Ambulatory Visit (INDEPENDENT_AMBULATORY_CARE_PROVIDER_SITE_OTHER)
Admission: RE | Admit: 2021-05-20 | Discharge: 2021-05-20 | Disposition: A | Discharge status: Home / Self Care | Payer: Medicare HMO | Source: Ambulatory Visit | Attending: Vascular Surgery | Admitting: Vascular Surgery

## 2021-05-20 ENCOUNTER — Ambulatory Visit (HOSPITAL_COMMUNITY)
Admission: RE | Admit: 2021-05-20 | Discharge: 2021-05-20 | Disposition: A | Discharge status: Home / Self Care | Payer: Medicare HMO | Source: Ambulatory Visit | Attending: Vascular Surgery | Admitting: Vascular Surgery

## 2021-05-20 ENCOUNTER — Ambulatory Visit (INDEPENDENT_AMBULATORY_CARE_PROVIDER_SITE_OTHER): Payer: Medicare HMO | Admitting: Vascular Surgery

## 2021-05-20 ENCOUNTER — Encounter: Payer: Self-pay | Admitting: Vascular Surgery

## 2021-05-20 VITALS — BP 137/88 | HR 77 | Temp 97.2°F | Resp 14 | Ht 73.0 in | Wt 308.0 lb

## 2021-05-20 DIAGNOSIS — N186 End stage renal disease: Secondary | ICD-10-CM | POA: Insufficient documentation

## 2021-05-20 NOTE — Progress Notes (Signed)
? ? ?Patient name: Tracey Stevens MRN: [id number] DOB: 10-Oct-1960 Sex: female ? ?REASON FOR CONSULT: Evaluate for permanent hemodialysis access ? ?HPI: ?Tracey Stevens is a 61 y.o. 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  Patient is right-handed.  She has a left IJ tunneled dialysis catheter that has been in place since September.  She also has a right chest wall Port-A-Cath for chemotherapy and is followed by oncology at [facility].  She reports no previous access in the past for hemodialysis. ? ?Past Medical History:  ?Diagnosis Date  ? Adrenal nodule ([place])   ? Cancer [facility])   ? cervical  ? CKD (chronic kidney disease) stage 3, GFR 30-59 ml/min (HCC)   ? Diabetes mellitus without complication ([place])   ? Diffuse Large B Cell Lymphoma 2015 remission  ? Hearing loss   ? Hyperparathyroidism [facility])   ? s/p parathyroidectomy  ? Hypertension   ? Hypertension   ? Multinodular goiter   ? Multiple renal cysts   ? Nephrolithiasis   ? Vertigo   ? Vitamin D deficiency   ? ? ?Past Surgical History:  ?Procedure Laterality Date  ? ABDOMINAL HYSTERECTOMY    ? BREAST BIOPSY Right over 10 years ago  ? benign  ? Left ear surgery    ? LIVER BIOPSY  2014  ? confirmed to be DLBCL  ? PARATHYROIDECTOMY    ? ? ?Family History  ?Problem Relation Age of Onset  ? Heart disease Mother   ? Hypertension Mother   ? Gastric cancer Father   ? Renal Disease Father   ? Cancer Maternal Grandmother   ? Leukemia Maternal Grandfather   ? Cancer Paternal Grandmother   ? Cancer Paternal Grandfather   ? ? ?SOCIAL HISTORY: ?Social History  ? ?Socioeconomic History  ? Marital status: Single  ?  Spouse name: Not on file  ? Number of children: 4  ? Years of education: Not on file  ? Highest education level: 12th grade  ?Occupational History  ? Occupation: disabled   ?Tobacco Use  ? Smoking status: Never  ? Smokeless tobacco: Never  ?Vaping Use  ? Vaping Use: Never used  ?Substance and Sexual Activity  ? Alcohol use: No  ? Drug use: No  ? Sexual activity: Not on file  ?Other Topics Concern  ? Not on file  ?Social History Narrative  ? Pt is right handed. She is single, lives alone in a 2nd floor apartment. She drinks 2-3 glasses of tea a day. She is active.  ? ?Social Determinants of Health  ? ?Financial Resource Strain: Not on file  ?Food Insecurity: Not on file  ?Transportation Needs: Not on file  ?Physical Activity: Not on file  ?Stress: Not on file  ?Social Connections: Not on file  ?Intimate Partner Violence: Not on file  ? ? ?Allergies  ?Allergen Reactions  ? Amlodipine Swelling  ?  Peripheral edema, ankles swell ?Peripheral edema ?  ? ? ?Current Outpatient Medications  ?Medication Sig Dispense Refill  ? acyclovir 500 mg in dextrose 5 % 100 mL Inject 500 mg into the vein daily.    ? dexamethasone (DECADRON) 4 MG/ML injection Inject 1 mL (4 mg total) into the vein every 12 (twelve) hours. 1 mL   ? docusate sodium (COLACE) 100 MG capsule Take 1 capsule (100 mg total) by mouth 2 (two) times  daily as needed for mild constipation. 10 capsule 0  ? fluconazole (DIFLUCAN) 2 mg/mL SOLN IVPB Inject 50 mLs (100 mg total) into the vein daily.    ? labetalol (NORMODYNE) 5 MG/ML injection Inject 2 mLs (10 mg total) into the vein every 2 (two) hours as needed (SBP > 180 or DBP > 100; HR must be > 70 to administer). 20 mL   ? lidocaine, PF, (XYLOCAINE) 1 % SOLN injection Inject 5 mLs into the skin as needed (topical anesthesia for hemodialysis if GEBAUERS is ineffective.). 1 mL 0  ? lidocaine-prilocaine (EMLA) cream Apply 1 application topically as needed (topical anesthesia for hemodialysis if Gebauers and Lidocaine injection are ineffective.). 30 g 0  ? magic mouthwash w/lidocaine SOLN Take 5 mLs by mouth 3 (three) times daily as needed for mouth pain.  0  ? Mouthwashes (MOUTH RINSE)  LIQD solution 15 mLs by Mouth Rinse route 2 (two) times daily.  0  ? ondansetron (ZOFRAN) 4 MG/2ML SOLN injection Inject 2 mLs (4 mg total) into the vein every 8 (eight) hours as needed for nausea or vomiting. 2 mL 0  ? pantoprazole (PROTONIX) 40 MG injection Inject 40 mg into the vein daily. 1 each   ? pentafluoroprop-tetrafluoroeth (GEBAUERS) AERO Apply 1 application topically as needed (topical anesthesia for hemodialysis).  0  ? piperacillin-tazobactam (ZOSYN) 2-0.25 GM/50ML IVPB Inject 50 mLs (2.25 g total) into the vein every 8 (eight) hours. 50 mL   ? polyethylene glycol (MIRALAX / GLYCOLAX) 17 g packet Take 17 g by mouth daily as needed for moderate constipation. 14 each 0  ? Tbo-Filgrastim (GRANIX) 480 MCG/0.8ML SOSY injection Inject 0.8 mLs (480 mcg total) into the skin daily at 6 (six) AM. 1 mL 0  ? ?No current facility-administered medications for this visit.  ? ? ?REVIEW OF SYSTEMS:  ?'[X]'$  denotes positive finding, '[ ]'$  denotes negative finding ?Cardiac  Comments:  ?Chest pain or chest pressure:    ?Shortness of breath upon exertion: x   ?Short of breath when lying flat:    ?Irregular heart rhythm:    ?    ?Vascular    ?Pain in calf, thigh, or hip brought on by ambulation:    ?Pain in feet at night that wakes you up from your sleep:     ?Blood clot in your veins:    ?Leg swelling:  x   ?    ?Pulmonary    ?Oxygen at home:    ?Productive cough:     ?Wheezing:     ?    ?Neurologic    ?Sudden weakness in arms or legs:     ?Sudden numbness in arms or legs:     ?Sudden onset of difficulty speaking or slurred speech:    ?Temporary loss of vision in one eye:     ?Problems with dizziness:     ?    ?Gastrointestinal    ?Blood in stool:     ?Vomited blood:     ?    ?Genitourinary    ?Burning when urinating:     ?Blood in urine:    ?    ?Psychiatric    ?Major depression:     ?    ?Hematologic    ?Bleeding problems:    ?Problems with blood clotting too easily:    ?    ?Skin    ?Rashes or ulcers:    ?     ?Constitutional    ?Fever or chills:    ? ? ?  PHYSICAL EXAM: ?Vitals:  ? 05/20/21 1127  ?BP: 137/88  ?Pulse: 77  ?Resp: 14  ?Temp: (!) 97.2 ?F (36.2 ?C)  ?TempSrc: Temporal  ?SpO2: 93%  ?Weight: (!) 308 lb (139.7 kg)  ?Height: '6\' 1"'$  (1.854 m)  ? ? ?GENERAL: The patient is a well-nourished female, in no acute distress. The vital signs are documented above. ?CARDIAC: There is a regular rate and rhythm.  ?VASCULAR:  ?Palpable radial and brachial pulses bilateral upper extremities ?Right chest wall port-a-cath ?Left IJ tunneled dialysis catheter ?PULMONARY: No respiratory distress. ?ABDOMEN: Soft and non-tender. ?MUSCULOSKELETAL: There are no major deformities or cyanosis. ?NEUROLOGIC: No focal weakness or paresthesias are detected. ?SKIN: There are no ulcers or rashes noted. ?PSYCHIATRIC: The patient has a normal affect. ? ?DATA:  ? ?Arterial duplex shows a high brachial bifurcation in the right upper extremity with normal bifurcation of the brachial artery at the antecubital fossa in the left upper extremity ? ?Vein mapping shows usable cephalic vein in the left arm which is the nondominant arm ? ?Assessment/Plan: ? 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  Discussed plan for placement in the nondominant arm which would be her left arm.  She does appear to have a usable cephalic vein in the left arm.  She also has a normal brachial artery bifurcation at the antecubital fossa.  I discussed steps of surgery as well as risk and benefits including bleeding, infection, failure to mature, steal syndrome.  All questions answered.  We will get her scheduled for outpatient surgery at [facility]. ? ? ?Marty Heck, MD ?[facility] ?Office: [phone] ? ? ? ? ?

## 2021-05-26 ENCOUNTER — Other Ambulatory Visit: Payer: Self-pay

## 2021-06-04 ENCOUNTER — Encounter (HOSPITAL_COMMUNITY): Payer: Self-pay | Admitting: Vascular Surgery

## 2021-06-04 ENCOUNTER — Other Ambulatory Visit: Payer: Self-pay

## 2021-06-04 NOTE — Progress Notes (Signed)
PCP - Dr. Mariann Laster ? ?Cardiologist - Denies ? ?EP- Denies ? ?Endocrine- Denies ? ?Pulm- Denies ? ?Chest x-ray - 03/22/21 (E) ? ?EKG - 03/20/21 (E) ? ?Stress Test - Denies ? ?ECHO - Denies ? ?Cardiac Cath - Denies ? ?AICD- na ?PM- na ?LOOP- na ? ?Nerve Stimulator- Denies ? ?Dialysis- M-W-F- Pt states she ad a full session, today ? ?Sleep Study - Denies ?CPAP - Denies ? ?LABS- 06/06/21: I-Stat 8 ? ?ASA- Denies ? ?ERAS- No ? ?HA1C- 09/13/20(E): 6.7 ? ?Anesthesia- Yes- medical history ? ?Pt denies having chest pain, sob, or fever during the pre-op phone call. All instructions explained to the pt, with a verbal understanding of the material including: as of today,  stop taking all Aspirin (unless instructed by your doctor) and Other Aspirin containing products, Vitamins, Fish oils, and Herbal medications. Also stop all NSAIDS i.e. Advil, Ibuprofen, Motrin, Aleve, Anaprox, Naproxen, BC, Goody Powders, and all Supplements.  Pt also instructed to wear a mask and social distance if she goes out. The opportunity to ask questions was provided.  ?

## 2021-06-05 ENCOUNTER — Telehealth: Payer: Self-pay

## 2021-06-05 ENCOUNTER — Encounter (HOSPITAL_COMMUNITY): Payer: Self-pay | Admitting: Vascular Surgery

## 2021-06-05 ENCOUNTER — Encounter (HOSPITAL_COMMUNITY): Payer: Self-pay | Admitting: Physician Assistant

## 2021-06-05 NOTE — Anesthesia Preprocedure Evaluation (Deleted)
Anesthesia Evaluation  ? ? ?Airway ? ? ? ? ? ? ? Dental ?  ?Pulmonary ? ?  ? ? ? ? ? ? ? Cardiovascular ?hypertension,  ? ? ?  ?Neuro/Psych ?  ? GI/Hepatic ?  ?Endo/Other  ? ? Renal/GU ?  ? ?  ?Musculoskeletal ? ? Abdominal ?  ?Peds ? Hematology ?  ?Anesthesia Other Findings ? ? Reproductive/Obstetrics ? ?  ? ? ? ? ? ? ? ? ? ? ? ? ? ?  ?  ? ? ? ? ? ? ? ? ?Anesthesia Physical ?Anesthesia Plan ? ?ASA:  ? ?Anesthesia Plan:   ? ?Post-op Pain Management:   ? ?Induction:  ? ?PONV Risk Score and Plan:  ? ?Airway Management Planned:  ? ?Additional Equipment:  ? ?Intra-op Plan:  ? ?Post-operative Plan:  ? ?Informed Consent:  ? ?Plan Discussed with:  ? ?Anesthesia Plan Comments: (Patient currently receiving 28-day continuous chemotherapy infusion for ALL. ? ?TTE 03/26/21 (care everywhere): ?SUMMARY  ?The left ventricular size is normal.  ?Left ventricular systolic function is low normal.  ?LV ejection fraction = 50-55%.  ?Left ventricular filling pattern is prolonged relaxation.  ?Unable to fully assess LV regional wall motion  ?The right ventricle is normal in size and function.  ?Cardiac valves not well visualized but no obvious stenosis or  ?regurgitation seen  ?There was insufficient TR detected to calculate RV systolic pressure.  ?The inferior vena cava was not visualized during the exam.  ?There is no pericardial effusion ?)  ? ? ? ? ? ?Anesthesia Quick Evaluation ? ?

## 2021-06-05 NOTE — Telephone Encounter (Signed)
Spoke with patient and informed of provider recommendations. Patient requested to reschedule surgery for April 21.  ?

## 2021-06-05 NOTE — Telephone Encounter (Signed)
-----   Message from Marty Heck, MD sent at 06/05/2021  2:50 PM EDT ----- ?Regarding: RE: Please advise re: Surgery 3/24 ?Yes I would delay if she is receiving continuous immunotherapy and the cancer center has a concern.  This is elective. ? ?Thanks, ? ?Chris ?----- Message ----- ?From: Nicholas Lose, RN ?Sent: 06/05/2021   1:44 PM EDT ?To: Marty Heck, MD ?Subject: Please advise re: Surgery 3/24                ? ?Received a call from Campbell Stall, NP from [facility] that patient started a 28 day continuous immunotherapy on today thru 07/03/21. Ms Carlis Abbott had a concern that the pump might interfere with her left arm AVF surgery tomorrow and is willing to delay surgery if needed. Please advise if patient is OK to proceed or recommend waiting until after treatment is complete.  ? ?Herma Ard ? ? ?

## 2021-07-04 ENCOUNTER — Ambulatory Visit (HOSPITAL_COMMUNITY): Admission: RE | Admit: 2021-07-04 | Payer: Medicare HMO | Source: Home / Self Care | Admitting: Vascular Surgery

## 2021-07-04 HISTORY — DX: Prediabetes: R73.03

## 2021-07-04 HISTORY — DX: Long term (current) use of other immunomodulators and immunosuppressants: Z79.69

## 2021-07-04 HISTORY — DX: Dependence on renal dialysis: N18.6

## 2021-07-04 HISTORY — DX: Other long term (current) drug therapy: Z79.899

## 2021-07-04 HISTORY — DX: Acute lymphoblastic leukemia not having achieved remission: C91.00

## 2021-07-04 HISTORY — DX: Dyspnea, unspecified: R06.00

## 2021-07-04 HISTORY — DX: Anemia, unspecified: D64.9

## 2021-07-04 SURGERY — ARTERIOVENOUS (AV) FISTULA CREATION
Anesthesia: Choice | Laterality: Left

## 2021-07-23 ENCOUNTER — Other Ambulatory Visit: Payer: Self-pay

## 2021-07-23 DIAGNOSIS — N186 End stage renal disease: Secondary | ICD-10-CM

## 2021-08-06 ENCOUNTER — Encounter (HOSPITAL_COMMUNITY): Payer: Self-pay

## 2021-08-06 ENCOUNTER — Emergency Department (HOSPITAL_COMMUNITY): Payer: Medicare HMO

## 2021-08-06 ENCOUNTER — Other Ambulatory Visit: Payer: Self-pay

## 2021-08-06 ENCOUNTER — Emergency Department (HOSPITAL_COMMUNITY)
Admission: EM | Admit: 2021-08-06 | Discharge: 2021-08-06 | Disposition: A | Discharge status: Home / Self Care | Payer: Medicare HMO | Attending: Emergency Medicine | Admitting: Emergency Medicine

## 2021-08-06 DIAGNOSIS — Z992 Dependence on renal dialysis: Secondary | ICD-10-CM | POA: Diagnosis not present

## 2021-08-06 DIAGNOSIS — R600 Localized edema: Secondary | ICD-10-CM | POA: Diagnosis not present

## 2021-08-06 DIAGNOSIS — I5030 Unspecified diastolic (congestive) heart failure: Secondary | ICD-10-CM | POA: Insufficient documentation

## 2021-08-06 DIAGNOSIS — J90 Pleural effusion, not elsewhere classified: Secondary | ICD-10-CM | POA: Diagnosis not present

## 2021-08-06 DIAGNOSIS — N186 End stage renal disease: Secondary | ICD-10-CM | POA: Insufficient documentation

## 2021-08-06 DIAGNOSIS — R0602 Shortness of breath: Secondary | ICD-10-CM

## 2021-08-06 DIAGNOSIS — D649 Anemia, unspecified: Secondary | ICD-10-CM | POA: Insufficient documentation

## 2021-08-06 HISTORY — DX: Heart failure, unspecified: I50.9

## 2021-08-06 LAB — CBC WITH DIFFERENTIAL/PLATELET
Abs Immature Granulocytes: 0 10*3/uL (ref 0.00–0.07)
Basophils Absolute: 0 10*3/uL (ref 0.0–0.1)
Basophils Relative: 0 %
Eosinophils Absolute: 0 10*3/uL (ref 0.0–0.5)
Eosinophils Relative: 0 %
HCT: 24.9 % — ABNORMAL LOW (ref 36.0–46.0)
Hemoglobin: 8.1 g/dL — ABNORMAL LOW (ref 12.0–15.0)
Lymphocytes Relative: 43 %
Lymphs Abs: 1.2 10*3/uL (ref 0.7–4.0)
MCH: 31.8 pg (ref 26.0–34.0)
MCHC: 32.5 g/dL (ref 30.0–36.0)
MCV: 97.6 fL (ref 80.0–100.0)
Monocytes Absolute: 0.1 10*3/uL (ref 0.1–1.0)
Monocytes Relative: 2 %
Neutro Abs: 1.5 10*3/uL — ABNORMAL LOW (ref 1.7–7.7)
Neutrophils Relative %: 55 %
Platelets: 147 10*3/uL — ABNORMAL LOW (ref 150–400)
RBC: 2.55 MIL/uL — ABNORMAL LOW (ref 3.87–5.11)
RDW: 12.7 % (ref 11.5–15.5)
WBC: 2.7 10*3/uL — ABNORMAL LOW (ref 4.0–10.5)
nRBC: 0 % (ref 0.0–0.2)
nRBC: 2 /100 WBC — ABNORMAL HIGH

## 2021-08-06 LAB — BASIC METABOLIC PANEL
Anion gap: 12 (ref 5–15)
BUN: 38 mg/dL — ABNORMAL HIGH (ref 8–23)
CO2: 27 mmol/L (ref 22–32)
Calcium: 9.6 mg/dL (ref 8.9–10.3)
Chloride: 102 mmol/L (ref 98–111)
Creatinine, Ser: 9.71 mg/dL — ABNORMAL HIGH (ref 0.44–1.00)
GFR, Estimated: 4 mL/min — ABNORMAL LOW (ref >60)
Glucose, Bld: 91 mg/dL (ref 70–99)
Potassium: 4.8 mmol/L (ref 3.5–5.1)
Sodium: 141 mmol/L (ref 135–145)

## 2021-08-06 LAB — TROPONIN I (HIGH SENSITIVITY): Troponin I (High Sensitivity): 41 ng/L — ABNORMAL HIGH (ref <18)

## 2021-08-06 LAB — BRAIN NATRIURETIC PEPTIDE: B Natriuretic Peptide: 785.6 pg/mL — ABNORMAL HIGH (ref 0.0–100.0)

## 2021-08-06 MED ORDER — CHLORHEXIDINE GLUCONATE CLOTH 2 % EX PADS: 6.0000 | MEDICATED_PAD | Freq: Every day | CUTANEOUS | Status: DC

## 2021-08-06 NOTE — Discharge Instructions (Signed)
It was a pleasure seeing you today.  I am glad your shortness of breath is improved.  We collected lab work which came back stable from your previous lab checks.  We evaluated your heart as well and that lab work is also improving.  We spoke with the kidney doctors who are going to let your dialysis center know you are going to be a little late but please go there as soon as you leave the hospital.  If your symptoms return, you have any chest pain please be reevaluated.  I hope you have a wonderful afternoon!

## 2021-08-06 NOTE — ED Provider Notes (Signed)
[facility] EMERGENCY DEPARTMENT Provider Note   CSN: [id number] Arrival date & time: 08/06/21  7412     History  Chief Complaint  Patient presents with   Shortness of Breath    Tracey Stevens is a 61 y.o. female.  Patient is a 61 year old female with PMH of B-cell AL L, anemia, moderate to severe neutropenia, new diagnosis of HFrEF (5/3), ESRD on dialysis MWF presenting with shortness of breath.  She reports that this occurs occasionally and usually resolves with oxygen.  She reports that she was doing fine last night and had no issues but woke up this morning extremely short of breath.  Reports that she has not missed any dialysis sessions and that her next dialysis session is scheduled for this morning at 11 AM.  Does feel like she has had worsening lower extremity swelling as well as "fluid in my lungs".  Has been compliant with heart failure medications as prescribed.  Denies any recent illnesses, congestion, rhinorrhea, fever, nausea, vomiting, diarrhea, chest pain.  Acknowledges a chronic cough as well as some chronic constipation.      Home Medications Prior to Admission medications   Medication Sig Start Date End Date Taking? Authorizing Provider  acyclovir 500 mg in dextrose 5 % 100 mL Inject 500 mg into the vein daily. Patient not taking: Reported on 06/03/2021 03/22/21   Candee Furbish, MD  atorvastatin (LIPITOR) 20 MG tablet Take 20 mg by mouth daily. 04/01/21   [provider]  dexamethasone (DECADRON) 4 MG/ML injection Inject 1 mL (4 mg total) into the vein every 12 (twelve) hours. Patient not taking: Reported on 06/03/2021 03/22/21   Candee Furbish, MD  docusate sodium (COLACE) 100 MG capsule Take 1 capsule (100 mg total) by mouth 2 (two) times daily as needed for mild constipation. Patient not taking: Reported on 06/03/2021 03/22/21   Candee Furbish, MD  fluconazole (DIFLUCAN) 2 mg/mL SOLN IVPB Inject 50 mLs (100 mg total) into the vein  daily. Patient not taking: Reported on 06/03/2021 03/22/21   Candee Furbish, MD  gabapentin (NEURONTIN) 300 MG capsule Take 300 mg by mouth at bedtime. 05/23/21   [provider]  labetalol (NORMODYNE) 5 MG/ML injection Inject 2 mLs (10 mg total) into the vein every 2 (two) hours as needed (SBP > 180 or DBP > 100; HR must be > 70 to administer). Patient not taking: Reported on 06/03/2021 03/22/21   Candee Furbish, MD  lidocaine, PF, (XYLOCAINE) 1 % SOLN injection Inject 5 mLs into the skin as needed (topical anesthesia for hemodialysis if GEBAUERS is ineffective.). Patient not taking: Reported on 06/03/2021 03/22/21   Candee Furbish, MD  lidocaine-prilocaine (EMLA) cream Apply 1 application topically as needed (topical anesthesia for hemodialysis if Gebauers and Lidocaine injection are ineffective.). Patient not taking: Reported on 06/03/2021 03/22/21   Candee Furbish, MD  losartan (COZAAR) 50 MG tablet Take 50 mg by mouth daily.    [provider]  magic mouthwash w/lidocaine SOLN Take 5 mLs by mouth 3 (three) times daily as needed for mouth pain. Patient not taking: Reported on 06/03/2021 03/22/21   Candee Furbish, MD  Mouthwashes (MOUTH RINSE) LIQD solution 15 mLs by Mouth Rinse route 2 (two) times daily. Patient not taking: Reported on 06/03/2021 03/22/21   Candee Furbish, MD  ondansetron [facility]) 4 MG/2ML SOLN injection Inject 2 mLs (4 mg total) into the vein every 8 (eight) hours as needed for nausea or  vomiting. Patient not taking: Reported on 06/03/2021 03/22/21   Candee Furbish, MD  pantoprazole (PROTONIX) 40 MG injection Inject 40 mg into the vein daily. Patient not taking: Reported on 06/03/2021 03/23/21   Candee Furbish, MD  pentafluoroprop-tetrafluoroeth [facility]) AERO Apply 1 application topically as needed (topical anesthesia for hemodialysis). Patient not taking: Reported on 06/03/2021 03/22/21   Candee Furbish, MD  piperacillin-tazobactam (ZOSYN) 2-0.25 GM/50ML IVPB Inject 50 mLs  (2.25 g total) into the vein every 8 (eight) hours. Patient not taking: Reported on 06/03/2021 03/22/21   Candee Furbish, MD  polyethylene glycol (MIRALAX / GLYCOLAX) 17 g packet Take 17 g by mouth daily as needed for moderate constipation. Patient not taking: Reported on 06/03/2021 03/22/21   Candee Furbish, MD  sulfamethoxazole-trimethoprim (BACTRIM) 200-40 MG/5ML suspension Take 10 mLs by mouth every Monday, Wednesday, and Friday with hemodialysis. 04/11/21   [provider]  Tbo-Filgrastim Galen Daft) 480 MCG/0.8ML SOSY injection Inject 0.8 mLs (480 mcg total) into the skin daily at 6 (six) AM. Patient not taking: Reported on 06/03/2021 03/22/21   Candee Furbish, MD      Allergies    Amlodipine and Methotrexate    Review of Systems   Review of Systems  Constitutional:  Positive for chills. Negative for fever.  HENT:  Negative for congestion and rhinorrhea.   Eyes:  Negative for visual disturbance.  Respiratory:  Positive for cough and shortness of breath. Negative for chest tightness.   Cardiovascular:  Positive for leg swelling. Negative for chest pain.  Gastrointestinal:  Positive for constipation. Negative for diarrhea, nausea and vomiting.  Genitourinary:  Negative for difficulty urinating.  Neurological:  Negative for weakness and headaches.  All other systems reviewed and are negative.  Physical Exam Updated Vital Signs BP 132/88   Pulse 85   Temp 98 F (36.7 C) (Oral)   Resp 19   Ht '6\' 1"'$  (1.854 m)   Wt (!) 142.9 kg   SpO2 97%   BMI 41.56 kg/m  Physical Exam Constitutional:      General: She is in acute distress.     Appearance: She is obese. She is not ill-appearing or diaphoretic.  HENT:     Head: Normocephalic and atraumatic.  Eyes:     Extraocular Movements: Extraocular movements intact.     Pupils: Pupils are equal, round, and reactive to light.  Cardiovascular:     Rate and Rhythm: Regular rhythm. Tachycardia present.  Pulmonary:     Effort: Tachypnea  (On 2 L nasal cannula, improves over 5 to 10 minutes) present. No respiratory distress.     Breath sounds: Examination of the right-lower field reveals rales. Rales present. No decreased breath sounds or wheezing.  Chest:     Chest wall: No mass.  Abdominal:     General: Bowel sounds are normal.     Palpations: Abdomen is soft.  Musculoskeletal:        General: Normal range of motion.     Cervical back: Normal range of motion and neck supple.     Right lower leg: Edema (+2) present.     Left lower leg: Edema (+2) present.  Skin:    General: Skin is warm.     Capillary Refill: Capillary refill takes less than 2 seconds.     Findings: No rash.  Neurological:     General: No focal deficit present.    ED Results / Procedures / Treatments   Labs (all labs ordered are listed,  but only abnormal results are displayed) Labs Reviewed  BASIC METABOLIC PANEL - Abnormal; Notable for the following components:      Result Value   BUN 38 (*)    Creatinine, Ser 9.71 (*)    GFR, Estimated 4 (*)    All other components within normal limits  CBC WITH DIFFERENTIAL/PLATELET - Abnormal; Notable for the following components:   WBC 2.7 (*)    RBC 2.55 (*)    Hemoglobin 8.1 (*)    HCT 24.9 (*)    Platelets 147 (*)    Neutro Abs 1.5 (*)    nRBC 2 (*)    All other components within normal limits  BRAIN NATRIURETIC PEPTIDE - Abnormal; Notable for the following components:   B Natriuretic Peptide 785.6 (*)    All other components within normal limits  TROPONIN I (HIGH SENSITIVITY) - Abnormal; Notable for the following components:   Troponin I (High Sensitivity) 41 (*)    All other components within normal limits  TROPONIN I (HIGH SENSITIVITY)    EKG None  Radiology DG Chest 2 View  Result Date: 08/06/2021 CLINICAL DATA:  Shortness of breath. EXAM: CHEST - 2 VIEW COMPARISON:  Chest radiograph 03/22/2021. FINDINGS: No consolidation. Similar enlargement of the cardiac silhouette. Right IJ  Port-A-Cath with tip projecting at the superior right atrium. Left IJ central venous catheter with tip projects at the superior cavoatrial junction. Suspected small right pleural effusion. No visible pneumothorax. IMPRESSION: 1. Suspected small right pleural effusion. Otherwise, no evidence of acute cardiopulmonary disease. 2. Cardiomegaly. Electronically Signed   By: Margaretha Sheffield M.D.   On: 08/06/2021 09:18    Procedures Procedures    Medications Ordered in ED Medications  Chlorhexidine Gluconate Cloth 2 % PADS 6 each (has no administration in time range)    ED Course/ Medical Decision Making/ A&P                           Medical Decision Making 61 year old female with PMH of B-cell AL L, anemia, moderate to severe neutropenia, new diagnosis of HFrEF (5/3), ESRD on dialysis MWF presenting with shortness of breath.  On arrival patient is tachypneic and in moderate respiratory distress.  O2 sat is at 99% on 2 L nasal cannula.  Breathing improves over 5 to 10 minutes.  CBC, CMP, troponin, BNP, chest x-ray ordered.  X-ray showing small right pleural effusion, CBC showing neutropenia and anemia (8.1), BNP of 700 down from 1700 at the previous check it during her hospitalization, high-sensitivity troponin of 48.  Patient's work of breathing greatly improved and she took off the nasal cannula O2.  She was eating a sandwich when I reevaluated her.  Spoke with nephrology regarding her dialysis session and they feel that she can do the dialysis session outpatient even if she is a little bit late.  They will call ahead to let them know.  Patient was able to get around and patient was discharged with strict return precautions.  Amount and/or Complexity of Data Reviewed Labs: ordered. Radiology: ordered.   Final Clinical Impression(s) / ED Diagnoses Final diagnoses:  Shortness of breath    Rx / DC Orders ED Discharge Orders     None         Gifford Shave, MD 08/06/21 1041     Lucrezia Starch, MD 08/07/21 [phone]

## 2021-08-06 NOTE — ED Triage Notes (Signed)
Pt arrived via [facility] from home for SOB. Pt dialysis M, W, Fri. Pt hasn't gone to dialysis yet today. Pt hasn't missed any dialysis. Pt has had SOBx2 days. Pt states SOB worse today. Pt is A&Ox4. Pt has unlabored breathing.

## 2021-09-11 ENCOUNTER — Telehealth: Payer: Self-pay

## 2021-09-11 ENCOUNTER — Encounter (HOSPITAL_COMMUNITY): Payer: Self-pay | Admitting: Physician Assistant

## 2021-09-11 ENCOUNTER — Encounter (HOSPITAL_COMMUNITY): Payer: Self-pay | Admitting: Vascular Surgery

## 2021-09-11 NOTE — Telephone Encounter (Signed)
Patient was scheduled for a L arm AVF on tomorrow with Dr Carlis Abbott. Received notification from pre-admission nurse Jan G. that patient informed her, she was not having surgery and the kidney center was supposed to have cancelled. Per the nurse, patient said that she has to see a cardiologist, because something is wrong with her heart and is not having surgery until they find out what is going on. Surgery cancelled, provider and referring-[facility] notified.

## 2021-09-11 NOTE — Progress Notes (Signed)
Anesthesia Chart Review: Same day workup  61 y.o. 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  ESRD on HD MWF via right chest permcath. Follows with cardiology at [facility] for recent diagnosis of cardiomyopathy.  Monitoring TTE 07/17/21 showed drop in EF to 20-25% with mildly dilated left ventricle and severe global hypokinesis of left ventricle.  She was referred to cardiology.  Last seen 09/04/2021 per note, "Cardiomyopathy- unclear etiology. 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 Currently undergoing treatment with Blincyto-- reported risk of arrhythmias but no cardiomyopathies reported. PET rubidium stress w/o inducible ischemia, did show fixed defect to apex that may represent ischemic CM. At this time, favor conservative management with GDMT titration as opposed to invasive LHC given ongoing chemotherapy- will discuss further with Dr. Ferdinand Lango."  Her Coreg was increased.  Losartan was discontinued and she was started on Entresto.  She is not on MRA/SGLT2i given ESRD.  Recommended follow-up in 3 months with repeat echo at that time and would consider ICD from intervention if EF remains low.  Reviewed history with anesthesiologist Dr. Gloris Manchester. He advised okay to proceed barring acute status change.  Will need DOS labs and evaluation.  EKG 08/21/2021 (Care Everywhere): Sinus tachycardia.  Rate 105.  Left axis deviation.  LVH with secondary QRS widening and repolarization abnormality.  No significant change.  PET cardiac rubidium perfusion rest/stress 08/21/2021 (Care Everywhere): 1.  There is a small fixed apical perfusion defect with associated hypokinesia of the apex and inferior wall. Findings are concerning for ischemic cardiomyopathy.  2.  Substantially diminished coronary flow reserve in all 3 vessels.  3.  Severe global hypokinesis with left ventricular ejection fraction 24%.  Akinetic distal inferoapical wall.  TTE 07/17/2021 (Care Everywhere): SUMMARY  The left ventricle is mildly dilated.  There is normal left ventricular wall thickness.  LV ejection fraction = 20-25%.  Left ventricular systolic function is severely reduced.  LV Global L Strain =-13.4%.  There is severe global hypokinesis of the left ventricle.  The right ventricle is normal in size and function.  There is no significant valvular stenosis or regurgitation.  The aortic sinus is normal size.  IVC size was normal.  There is no pericardial effusion.  Compared to the last study dated 03/26/21, there is severe reduction in  EF and dilation of the LV. The prior studies were technically  challenging but comparing directly to the 09/2020 study and the 03/2021  study, there has probably been a steady decline in LVEF from 50% to  40% to current 20-25%.      Wynonia Musty [facility]/Anesthesiology Phone [phone] 09/11/2021 2:34 PM

## 2021-09-11 NOTE — Progress Notes (Signed)
I spoke with Tracey Stevens, she asked, why do we keep calling her, that surgery was suppose to be cancelled, the kidney center was suppose to cancel it.

## 2021-09-11 NOTE — Anesthesia Preprocedure Evaluation (Deleted)
Anesthesia Evaluation    Airway        Dental   Pulmonary           Cardiovascular hypertension,      Neuro/Psych    GI/Hepatic   Endo/Other    Renal/GU      Musculoskeletal   Abdominal   Peds  Hematology   Anesthesia Other Findings   Reproductive/Obstetrics                             Anesthesia Physical Anesthesia Plan  ASA:   Anesthesia Plan:    Post-op Pain Management:    Induction:   PONV Risk Score and Plan:   Airway Management Planned:   Additional Equipment:   Intra-op Plan:   Post-operative Plan:   Informed Consent:   Plan Discussed with:   Anesthesia Plan Comments: (PAT note by [name], PA-C: 61 y.o. 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  ESRD on HDMWFvia right chest permcath. Follows with cardiology at [facility] for recent diagnosis of cardiomyopathy.  Monitoring TTE 07/17/21 showed drop in EF to 20-25% with mildly dilated left ventricle and severe global hypokinesis of left ventricle.  She was referred to cardiology.  Last seen 09/04/2021 per note, "Cardiomyopathy- unclear etiology. 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 Currently undergoing treatment with Blincyto-- reported risk of arrhythmias but no cardiomyopathies reported. PET rubidium stress w/o inducible ischemia, did show fixed defect to apex that may represent ischemic CM. At this time, favor conservative management with GDMT titration as opposed to invasive LHC given ongoing chemotherapy- will discuss further with Dr. Ferdinand Lango."  Her Coreg was increased.  Losartan was discontinued and she was started on Entresto.  She is not on MRA/SGLT2i given ESRD.  Recommended follow-up in 3 months with repeat echo at that time and would consider ICD from intervention if EF remains low.  Reviewed history  with anesthesiologist Dr. Gloris Manchester. He advised okay to proceed barring acute status change.  Will need DOS labs and evaluation.  EKG 08/21/2021 (Care Everywhere): Sinus tachycardia.  Rate 105.  Left axis deviation.  LVH with secondary QRS widening and repolarization abnormality.  No significant change.  PET cardiac rubidium perfusion rest/stress 08/21/2021 (Care Everywhere): 1. There is a small fixed apical perfusion defect with associated hypokinesia of the apex and inferior wall. Findings are concerning for ischemic cardiomyopathy.  2. Substantially diminished coronary flow reserve in all 3 vessels.  3. Severe global hypokinesis with left ventricular ejection fraction 24%. Akinetic distal inferoapical wall.  TTE 07/17/2021 (Care Everywhere): SUMMARY  The left ventricle is mildly dilated.  There is normal left ventricular wall thickness.  LV ejection fraction = 20-25%.  Left ventricular systolic function is severely reduced.  LV Global L Strain =-13.4%.  There is severe global hypokinesis of the left ventricle.  The right ventricle is normal in size and function.  There is no significant valvular stenosis or regurgitation.  The aortic sinus is normal size.  IVC size was normal.  There is no pericardial effusion.  Compared to the last study dated 03/26/21, there is severe reduction in  EF and dilation of the LV. The prior studies were technically  challenging but comparing directly to the 09/2020 study and the 03/2021  study, there has probably been a steady decline in LVEF from 50% to  40% to current  20-25%.   )        Anesthesia Quick Evaluation

## 2021-09-12 ENCOUNTER — Ambulatory Visit (HOSPITAL_COMMUNITY): Admission: RE | Admit: 2021-09-12 | Payer: Medicare HMO | Source: Home / Self Care | Admitting: Vascular Surgery

## 2021-09-12 HISTORY — DX: Personal history of other medical treatment: Z92.89

## 2021-09-12 SURGERY — ARTERIOVENOUS (AV) FISTULA CREATION
Anesthesia: Choice | Laterality: Left

## 2022-02-24 ENCOUNTER — Ambulatory Visit (INDEPENDENT_AMBULATORY_CARE_PROVIDER_SITE_OTHER): Payer: Medicare HMO | Admitting: Podiatry

## 2022-02-24 DIAGNOSIS — I890 Lymphedema, not elsewhere classified: Secondary | ICD-10-CM

## 2022-02-24 DIAGNOSIS — C801 Malignant (primary) neoplasm, unspecified: Secondary | ICD-10-CM | POA: Diagnosis not present

## 2022-02-26 NOTE — Progress Notes (Signed)
  Subjective:  Patient ID: Tracey Stevens, female    DOB: 1960/11/14,  MRN: [id number]  Chief Complaint  Patient presents with   Foot Swelling    Bilateral foot and ankle swelling -patient has ESRD and is on dialysis. She is also battling cancer, on chemotherapy.    61 y.o. female presents with the above complaint. History confirmed with patient.   Objective:  Physical Exam: warm, good capillary refill, no trophic changes or ulcerative lesions, normal DP and PT pulses, normal sensory exam, and pitting edema and lymphedema noted bilaterally  Assessment:   1. 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  We discussed etiology and treatment signs of lymphedema including compression stockings elevation and exercise.  I suspect this likely is related to her cancer treatment.  I have placed a referral to the lymphedema management clinic for her.  We will see her back as needed  Return if symptoms worsen or fail to improve.

## 2022-03-02 ENCOUNTER — Telehealth: Payer: Self-pay | Admitting: *Deleted

## 2022-03-02 NOTE — Telephone Encounter (Signed)
[facility] w/ [employer]. Received office notes for this patient but she is not a patient of theirs, was referred in 2019, patient never scheduled an appointment, any questions please call @ [phone].

## 2022-03-10 ENCOUNTER — Other Ambulatory Visit: Payer: Self-pay

## 2022-03-10 ENCOUNTER — Ambulatory Visit: Payer: Medicare HMO | Attending: Podiatry | Admitting: Rehabilitation

## 2022-03-10 ENCOUNTER — Encounter: Payer: Self-pay | Admitting: Rehabilitation

## 2022-03-10 DIAGNOSIS — I890 Lymphedema, not elsewhere classified: Secondary | ICD-10-CM | POA: Insufficient documentation

## 2022-03-10 DIAGNOSIS — C801 Malignant (primary) neoplasm, unspecified: Secondary | ICD-10-CM | POA: Insufficient documentation

## 2022-03-10 DIAGNOSIS — C9100 Acute lymphoblastic leukemia not having achieved remission: Secondary | ICD-10-CM | POA: Diagnosis present

## 2022-03-10 DIAGNOSIS — R262 Difficulty in walking, not elsewhere classified: Secondary | ICD-10-CM | POA: Insufficient documentation

## 2022-03-10 DIAGNOSIS — N185 Chronic kidney disease, stage 5: Secondary | ICD-10-CM | POA: Diagnosis present

## 2022-03-10 NOTE — Therapy (Incomplete Revision)
OUTPATIENT PHYSICAL THERAPY ONCOLOGY EVALUATION  Patient Name: Tracey Stevens MRN: [id number] DOB:[birth year], 61 y.o., female Today's Date: 03/10/2022  END OF SESSION:  PT End of Session - 03/10/22 1206     Visit Number 1    Number of Visits 4    Date for PT Re-Evaluation 06/02/22    Authorization Type [employer]    PT Start Time 1104    PT Stop Time 1200    PT Time Calculation (min) 56 min    Activity Tolerance Patient tolerated treatment well    Behavior During Therapy WFL for tasks assessed/performed             Past Medical History:  Diagnosis Date   Acute lymphoblastic leukemia (ALL) ([place])    Adrenal nodule ([place])    Anemia    Cancer ([place])    cervical   Congestive heart failure (CHF) ([place])    Diffuse Large B Cell Lymphoma 2015 remission   Dyspnea    SOB when she over exerts herself   ESRD on dialysis [facility])    M-W-F   Hearing loss    History of blood transfusion    Hyperparathyroidism ([place])    s/p parathyroidectomy   Hypertension    Hypertension    Multinodular goiter    Multiple renal cysts    Nephrolithiasis    On antineoplastic chemotherapy    Pre-diabetes    Most recent A1C 6.7- 09/13/20. No blood sugar checks   Vertigo    Vitamin D deficiency    Past Surgical History:  Procedure Laterality Date   ABDOMINAL HYSTERECTOMY     BREAST BIOPSY Right over 10 years ago   benign   Left ear surgery     LIVER BIOPSY  2014   confirmed to be DLBCL   PARATHYROIDECTOMY     TUBAL LIGATION     Patient Active Problem List   Diagnosis Date Noted   Collapsed vertebra, not elsewhere classified, site unspecified, subsequent encounter for fracture with routine healing 05/12/2021   Colon cancer screening 05/12/2021   Disorder of adrenal gland, unspecified ([place]) 05/12/2021   Hardening of the aorta (main artery of the heart) ([place]) 05/12/2021   History of primary hyperparathyroidism 05/12/2021   Obesity 05/12/2021   Bilateral foot pain 04/15/2021   Febrile neutropenia  ([place]) 03/21/2021   Epiglottitis 03/21/2021   Sepsis ([place]) 03/21/2021   Conductive hearing loss, unilateral, left ear, 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 12/23/2020   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   ESRD (end stage renal disease) ([place]) 12/20/2020   ALL (acute lymphoblastic leukemia) ([place]) 12/12/2020   AKI (acute kidney injury) ([place]) 12/12/2020   SIRS (systemic inflammatory response syndrome) ([place]) 12/12/2020   Bilateral lower extremity edema 10/10/2020   Elevated glucose 10/09/2020   Peripheral edema 10/09/2020   Diffuse large B cell lymphoma ([place]) 09/11/2020   Lymphocytosis 09/11/2020   CKD (chronic kidney disease) stage 5, GFR less than 15 ml/min ([place]) 09/11/2020   Anemia 09/11/2020   Dysuria 09/11/2020   Diabetes mellitus without complication ([place]) 62/83/1517   Hypercalcemia 06/25/2020   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 Bernerd Limbo  REFERRING PROVIDER: Dr. 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 [facility])   THERAPY DIAG:  Acute lymphoblastic leukemia (ALL) not having achieved remission (HCC)  CKD (chronic kidney disease) stage 5, GFR less than 15 ml/min (HCC)  Lymphedema, not elsewhere classified  Difficulty in walking, not elsewhere classified  ONSET DATE: 09/12/20  Rationale for Evaluation and Treatment: Rehabilitation  SUBJECTIVE:                                                                                                                                                                                            SUBJECTIVE STATEMENT: I have swelling in both legs up to the knees.  It is not so swollen in the morning and then gets worse by the end of the day.    PERTINENT HISTORY: B-Cell ALL. Chemotherapy induction 10/04/20 with refractory disease noted. ESRD now with Dialysis. Recent podiatry exam: normal pedal pulses and good sensation. Hx of cervical cancer with total hysterectomy in 2003. Lymphoma in 2014. Ineligible for stem cell transplant due to ESRD. Compression fx T12, osteolytic sternal lesion. Has a chemotherapy pump that works 24/7.  Does not have compression socks.  I have dialysis M/W/F.  Last EF was 25%  Renal: Dr. Junious Silk at [facility].  Hem/Onc: Dr. Linus Orn  Cardiac: Dorina Hoyer, MD  PAIN:  Are you having pain? Yes NPRS scale: did not rate Pain location: bil Les, bil knees Pain orientation: Bilateral   PRECAUTIONS: cardiac, kidney, compression fracture T12, sternal lesion, 24/7 chemo pump  WEIGHT BEARING RESTRICTIONS: No  FALLS:  Has patient fallen in last 6 months? No  LIVING ENVIRONMENT: Has rollator walker - has home health aide daily, not driving  OCCUPATION: not working  LEISURE: none  PRIOR LEVEL OF FUNCTION: Requires assistive device for independence and Needs assistance with ADLs  PATIENT GOALS: what to do for swelling   OBJECTIVE:  COGNITION: Overall cognitive status: Within functional limits for tasks assessed   PALPATION: Pitting bil from foot to knee, combination of soft/boggy and pitting around the malleoli.    OBSERVATIONS / OTHER ASSESSMENTS: most swollen around ankle, pretty equal bilaterally.  Slow to stand and walks in forward flexed position.   SENSATION: Reports burning and pain feet from chemotherapy.   POSTURE: increased kyphosis  LOWER EXTREMITY LANDMARK RIGHT eval  At groin   30 cm proximal to suprapatella   20 cm proximal to suprapatella   10 cm proximal to suprapatella   At  midpatella / popliteal crease 50  30  cm proximal to floor at lateral plantar foot 39.5  20 cm proximal to floor at lateral plantar foot 35  10 cm proximal to floor at lateral plantar foot 33  Circumference of ankle/heel 39 - widest part  5 cm proximal to 1st MTP joint 28.7  Across MTP joint 27.5  Around proximal great toe 10.2  (Blank rows = not tested)  47 length  LOWER EXTREMITY LANDMARK LEFT eval  At groin   30 cm proximal to suprapatella   20 cm proximal to suprapatella   10 cm proximal to suprapatella   At midpatella / popliteal crease 50  30 cm proximal to floor at lateral plantar foot 42  20 cm proximal to floor at lateral plantar foot 35.3  10 cm proximal to floor at lateral plantar foot 32.3  Circumference of ankle/heel 39 widest part  5 cm proximal to 1st MTP joint 27.7  Across MTP joint 26.9  Around proximal great toe 9.5  (Blank rows = not tested)  FUNCTIONAL TESTS:  Sit to stand: unable without hands and time of 10 seconds x 1  TODAY'S TREATMENT:                                                                                                                                         DATE:  03/10/22 Discussed treatment options: pt has dialysis 3 days a week and chemo 1 day a week so she is unable to come in for CDT and may not tolerate due to medical issues.  Discussed other options being velcro and other stockings that fit well, but due to fluctuation of edema and combination of lymphedema and cardiac/kidney related edema we will start with velcro.  Will send in medicare for new year payment of garments.  Discussed Left calf pain: pt reports she has a hx of blood clots that are discovered at dialysis and she will let them know tomorrow about the calf pain as well as go sooner with any SOB or increased pain.    PATIENT EDUCATION:  Education details: POC Person educated: Patient Education method: Customer service manager Education comprehension: verbalized  understanding  HOME EXERCISE PROGRAM: Heel raises, toe raises, standing and walking at home, LAQ, mini squats, elevation  ASSESSMENT:  CLINICAL IMPRESSION: Patient is a 61 y.o. female who was seen today for physical therapy evaluation and treatment for her chronic LE swelling.  Pt presents with bilateral soft pitting edema from foot to knee.  She reports that it is not very swollen in the morning.  Both legs are +1 ttp with the left calf being +2 and pt reporting this is more new in the past week.  She will let dialysis know about new calf pain.  Due to mulitple causes for edema we will order velcro garments for the lower legs and pt will bring them back in for education on use and donning, etc.  Pt knows it may be awhile due to new insurance payment rules.    OBJECTIVE IMPAIRMENTS: cardiopulmonary status limiting activity, decreased activity tolerance, decreased knowledge of condition, decreased knowledge of use of DME, decreased mobility, difficulty walking, and increased edema.   ACTIVITY LIMITATIONS: squatting, sleeping, and locomotion level  PARTICIPATION LIMITATIONS: meal prep, cleaning, laundry, driving, community activity, and yard work  PERSONAL FACTORS: Age, Behavior pattern, Fitness, and 3+ comorbidities:    are also affecting patient's functional outcome.   REHAB POTENTIAL: Good   CLINICAL DECISION MAKING: Evolving/moderate complexity  EVALUATION COMPLEXITY: Moderate  GOALS: Goals reviewed with patient? Yes   LONG TERM GOALS: Target date: 06/02/22  Pt will obtain compression to prevent worsening of LE edema and fibrosis Baseline:  Goal status: INITIAL   PLAN:  PT FREQUENCY: 1x/month  PT DURATION: 12 weeks  PLANNED INTERVENTIONS: Therapeutic exercises, Patient/Family education, DME instructions, Manual therapy, and Re-evaluation  PLAN FOR NEXT SESSION: education on donning velcro and fit check - schedule more for recheck? Addend: lymphedema treatment okayed by  cardiology via email on 03/12/22.    Stark Bray, PT 03/10/2022, 12:07 PM

## 2022-03-10 NOTE — Therapy (Signed)
OUTPATIENT PHYSICAL THERAPY ONCOLOGY EVALUATION  Patient Name: Tracey Stevens MRN: [id number] DOB:27-Apr-1960, 61 y.o., female Today's Date: 03/10/2022  END OF SESSION:  PT End of Session - 03/10/22 1206     Visit Number 1    Number of Visits 4    Date for PT Re-Evaluation 06/02/22    Authorization Type [employer]    PT Start Time 1104    PT Stop Time 1200    PT Time Calculation (min) 56 min    Activity Tolerance Patient tolerated treatment well    Behavior During Therapy WFL for tasks assessed/performed             Past Medical History:  Diagnosis Date   Acute lymphoblastic leukemia (ALL) ([place])    Adrenal nodule ([place])    Anemia    Cancer ([place])    cervical   Congestive heart failure (CHF) ([place])    Diffuse Large B Cell Lymphoma 2015 remission   Dyspnea    SOB when she over exerts herself   ESRD on dialysis [facility])    M-W-F   Hearing loss    History of blood transfusion    Hyperparathyroidism ([place])    s/p parathyroidectomy   Hypertension    Hypertension    Multinodular goiter    Multiple renal cysts    Nephrolithiasis    On antineoplastic chemotherapy    Pre-diabetes    Most recent A1C 6.7- 09/13/20. No blood sugar checks   Vertigo    Vitamin D deficiency    Past Surgical History:  Procedure Laterality Date   ABDOMINAL HYSTERECTOMY     BREAST BIOPSY Right over 10 years ago   benign   Left ear surgery     LIVER BIOPSY  2014   confirmed to be DLBCL   PARATHYROIDECTOMY     TUBAL LIGATION     Patient Active Problem List   Diagnosis Date Noted   Collapsed vertebra, not elsewhere classified, site unspecified, subsequent encounter for fracture with routine healing 05/12/2021   Colon cancer screening 05/12/2021   Disorder of adrenal gland, unspecified ([place]) 05/12/2021   Hardening of the aorta (main artery of the heart) ([place]) 05/12/2021   History of primary hyperparathyroidism 05/12/2021   Obesity 05/12/2021   Bilateral foot pain 04/15/2021   Febrile neutropenia  ([place]) 03/21/2021   Epiglottitis 03/21/2021   Sepsis ([place]) 03/21/2021   Conductive hearing loss, unilateral, left ear, 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 12/23/2020   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   ESRD (end stage renal disease) ([place]) 12/20/2020   ALL (acute lymphoblastic leukemia) ([place]) 12/12/2020   AKI (acute kidney injury) ([place]) 12/12/2020   SIRS (systemic inflammatory response syndrome) ([place]) 12/12/2020   Bilateral lower extremity edema 10/10/2020   Elevated glucose 10/09/2020   Peripheral edema 10/09/2020   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/2020   Lymphocytosis 09/11/2020   CKD (chronic kidney disease) stage 5, GFR less than 15 ml/min ([place]) 09/11/2020   Anemia 09/11/2020   Dysuria 09/11/2020   Diabetes mellitus without complication ([place]) 65/46/5035   Hypercalcemia 06/25/2020   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 Bernerd Limbo  REFERRING PROVIDER: Dr. 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 [facility])   THERAPY DIAG:  Acute lymphoblastic leukemia (ALL) not having achieved remission (HCC)  CKD (chronic kidney disease) stage 5, GFR less than 15 ml/min (HCC)  Lymphedema, not elsewhere classified  Difficulty in walking, not elsewhere classified  ONSET DATE: 09/12/20  Rationale for Evaluation and Treatment: Rehabilitation  SUBJECTIVE:                                                                                                                                                                                            SUBJECTIVE STATEMENT: I have swelling in both legs up to the knees.  It is not so swollen in the morning and then gets worse by the end of the day.    PERTINENT HISTORY: B-Cell ALL. Chemotherapy induction 10/04/20 with refractory disease noted. ESRD now with Dialysis. Recent podiatry exam: normal pedal pulses and good sensation. Hx of cervical cancer with total hysterectomy in 2003. Lymphoma in 2014. Ineligible for stem cell transplant due to ESRD. Compression fx T12, osteolytic sternal lesion. Has a chemotherapy pump that works 24/7.  Does not have compression socks.  I have dialysis M/W/F.  Last EF was 25%  Renal: [facility] Junious Silk [facility].  Hem/Onc: Dr. Linus Orn  Cardiac: Dorina Hoyer, MD  PAIN:  Are you having pain? Yes NPRS scale: did not rate Pain location: bil Les, bil knees Pain orientation: Bilateral   PRECAUTIONS: cardiac, kidney, compression fracture T12, sternal lesion, 24/7 chemo pump  WEIGHT BEARING RESTRICTIONS: No  FALLS:  Has patient fallen in last 6 months? No  LIVING ENVIRONMENT: Has rollator walker - has home health aide daily, not driving  OCCUPATION: not working  LEISURE: none  PRIOR LEVEL OF FUNCTION: Requires assistive device for independence and Needs assistance with ADLs  PATIENT GOALS: what to do for swelling   OBJECTIVE:  COGNITION: Overall cognitive status: Within functional limits for tasks assessed   PALPATION: Pitting bil from foot to knee, combination of soft/boggy and pitting around the malleoli.    OBSERVATIONS / OTHER ASSESSMENTS: most swollen around ankle, pretty equal bilaterally.  Slow to stand and walks in forward flexed position.   SENSATION: Reports burning and pain feet from chemotherapy.   POSTURE: increased kyphosis  LOWER EXTREMITY LANDMARK RIGHT eval  At groin   30 cm proximal to suprapatella   20 cm proximal to suprapatella   10 cm proximal to suprapatella   At  midpatella / popliteal crease 50  30  cm proximal to floor at lateral plantar foot 39.5  20 cm proximal to floor at lateral plantar foot 35  10 cm proximal to floor at lateral plantar foot 33  Circumference of ankle/heel 39 - widest part  5 cm proximal to 1st MTP joint 28.7  Across MTP joint 27.5  Around proximal great toe 10.2  (Blank rows = not tested)  47 length  LOWER EXTREMITY LANDMARK LEFT eval  At groin   30 cm proximal to suprapatella   20 cm proximal to suprapatella   10 cm proximal to suprapatella   At midpatella / popliteal crease 50  30 cm proximal to floor at lateral plantar foot 42  20 cm proximal to floor at lateral plantar foot 35.3  10 cm proximal to floor at lateral plantar foot 32.3  Circumference of ankle/heel 39 widest part  5 cm proximal to 1st MTP joint 27.7  Across MTP joint 26.9  Around proximal great toe 9.5  (Blank rows = not tested)  FUNCTIONAL TESTS:  Sit to stand: unable without hands and time of 10 seconds x 1  TODAY'S TREATMENT:                                                                                                                                         DATE:  03/10/22 Discussed treatment options: pt has dialysis 3 days a week and chemo 1 day a week so she is unable to come in for CDT and may not tolerate due to medical issues.  Discussed other options being velcro and other stockings that fit well, but due to fluctuation of edema and combination of lymphedema and cardiac/kidney related edema we will start with velcro.  Will send in medicare for new year payment of garments.  Discussed Left calf pain: pt reports she has a hx of blood clots that are discovered at dialysis and she will let them know tomorrow about the calf pain as well as go sooner with any SOB or increased pain.    PATIENT EDUCATION:  Education details: POC Person educated: Patient Education method: Customer service manager Education comprehension: verbalized  understanding  HOME EXERCISE PROGRAM: Heel raises, toe raises, standing and walking at home, LAQ, mini squats, elevation  ASSESSMENT:  CLINICAL IMPRESSION: Patient is a 61 y.o. female who was seen today for physical therapy evaluation and treatment for her chronic LE swelling.  Pt presents with bilateral soft pitting edema from foot to knee.  She reports that it is not very swollen in the morning.  Both legs are +1 ttp with the left calf being +2 and pt reporting this is more new in the past week.  She will let dialysis know about new calf pain.  Due to mulitple causes for edema we will order velcro garments for the lower legs and pt will bring them back in for education on use and donning, etc.  Pt knows it may be awhile due to new insurance payment rules.    OBJECTIVE IMPAIRMENTS: cardiopulmonary status limiting activity, decreased activity tolerance, decreased knowledge of condition, decreased knowledge of use of DME, decreased mobility, difficulty walking, and increased edema.   ACTIVITY LIMITATIONS: squatting, sleeping, and locomotion level  PARTICIPATION LIMITATIONS: meal prep, cleaning, laundry, driving, community activity, and yard work  PERSONAL FACTORS: Age, Behavior pattern, Fitness, and 3+ comorbidities:    are also affecting patient's functional outcome.   REHAB POTENTIAL: Good   CLINICAL DECISION MAKING: Evolving/moderate complexity  EVALUATION COMPLEXITY: Moderate  GOALS: Goals reviewed with patient? Yes   LONG TERM GOALS: Target date: 06/02/22  Pt will obtain compression to prevent worsening of LE edema and fibrosis Baseline:  Goal status: INITIAL   PLAN:  PT FREQUENCY: 1x/month  PT DURATION: 12 weeks  PLANNED INTERVENTIONS: Therapeutic exercises, Patient/Family education, DME instructions, Manual therapy, and Re-evaluation  PLAN FOR NEXT SESSION: education on donning velcro and fit check - schedule more for recheck?   Stark Bray, PT 03/10/2022, 12:07  PM

## 2022-03-10 NOTE — Addendum Note (Signed)
Addended bySherryle Lis, Ikenna Ohms R on: 03/10/2022 04:10 PM   Modules accepted: Orders

## 2022-04-01 ENCOUNTER — Telehealth: Payer: Self-pay | Admitting: Rehabilitation

## 2022-04-01 NOTE — Telephone Encounter (Signed)
Called pt to let her know that compression is still not being covered by her [facility] but they are anticipating it to start in the next month or two.  Pt would like to wait for coverage vs pay OOP at this time.

## 2022-07-18 IMAGING — CT CT NECK W/O CM
4 of 5 series · 14 of 33 positions shown, 16 images · IV contrast (Omni 300)
Comparison: None available.

CLINICAL DATA: Initial evaluation for acute neck/throat pain,
epiglottitis or tonsillitis suspected.

EXAM:
CT NECK WITHOUT CONTRAST
TECHNIQUE: Multidetector CT imaging of the neck was performed following the
standard protocol without intravenous contrast.

[Series 4: bone · axial · 0.57mm/px · z∈[-177,-61]mm · 3 of 116 slices shown, 4 images]
[im 29/116  soft-tissue]
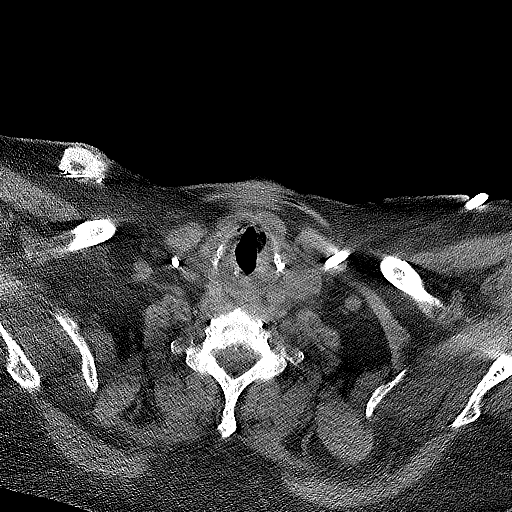
[im 29/116  bone]
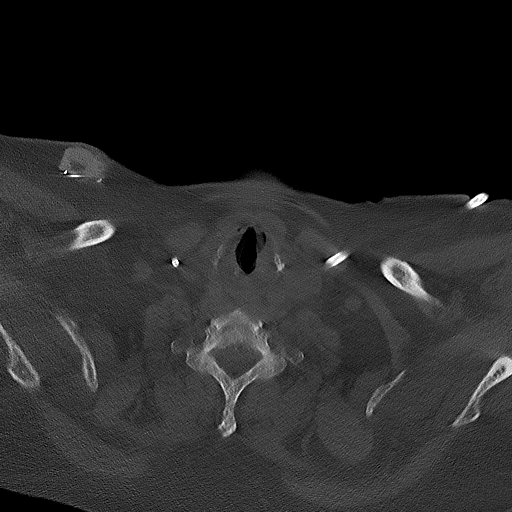
[im 58/116  bone]
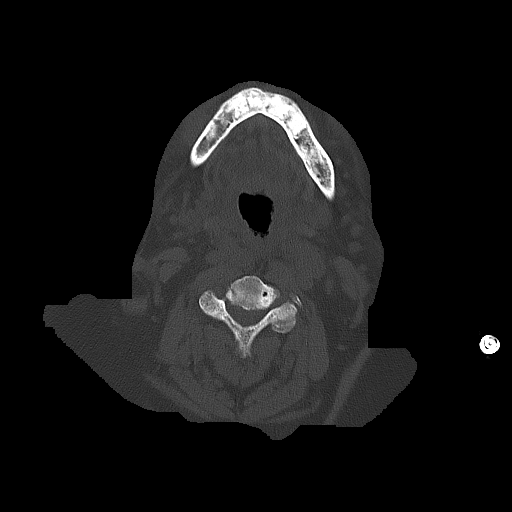
[im 87/116  bone]
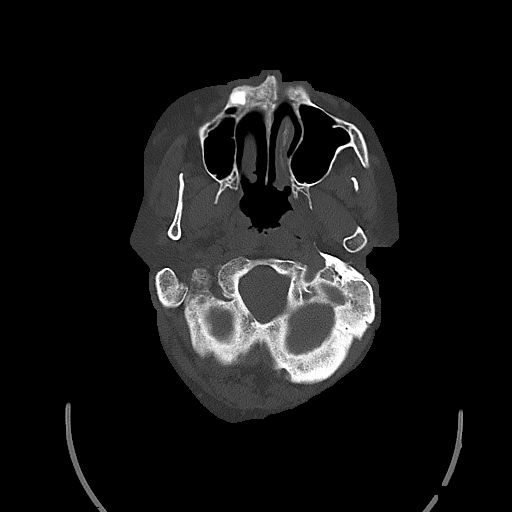

[Series 5: sagittal · sagittal · 0.46mm/px · 5 of 108 slices shown, 6 images]
[im 36/108  bone]
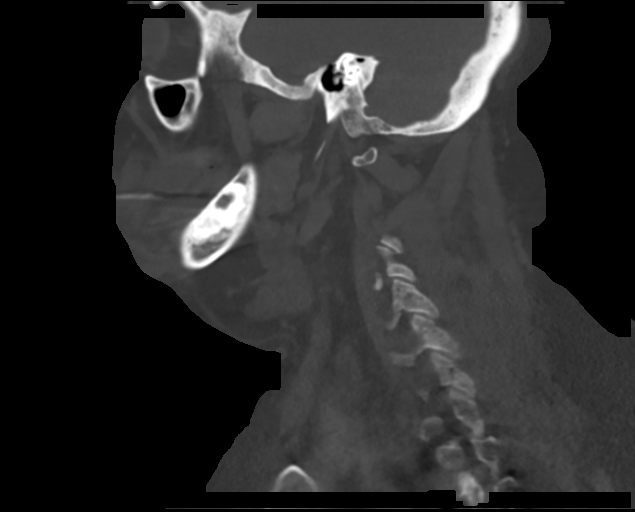
[im 45/108  bone]
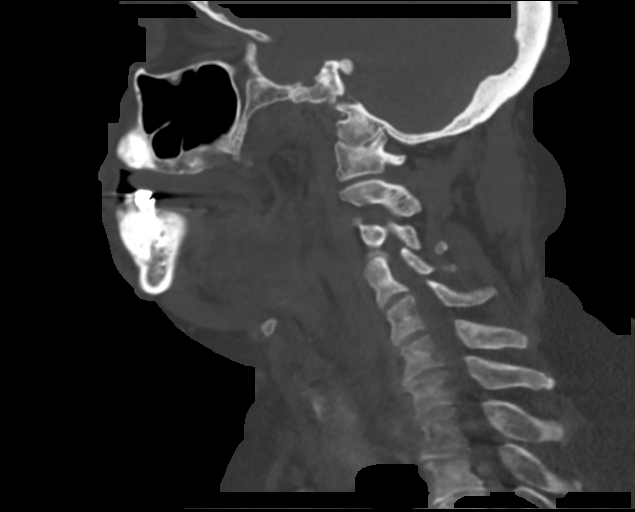
[im 54/108  soft-tissue]
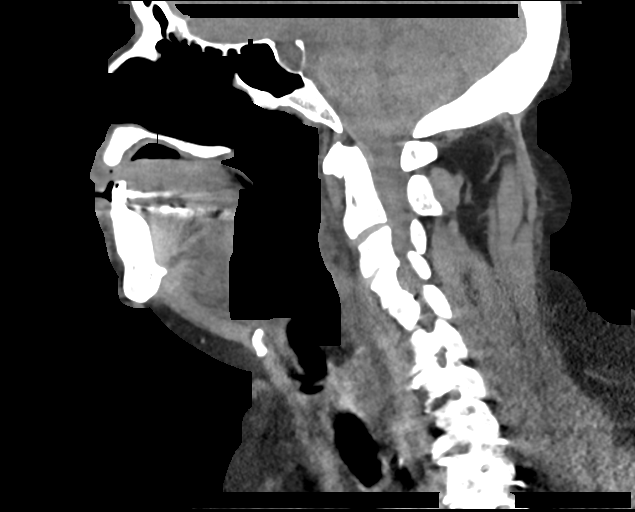
[im 54/108  bone]
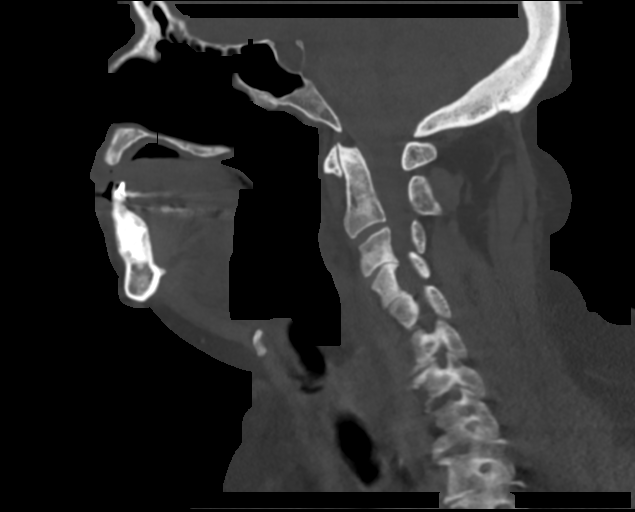
[im 63/108  bone]
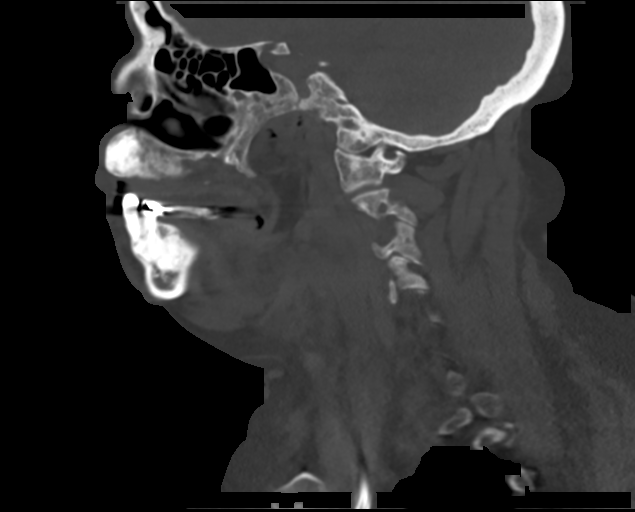
[im 72/108  bone]
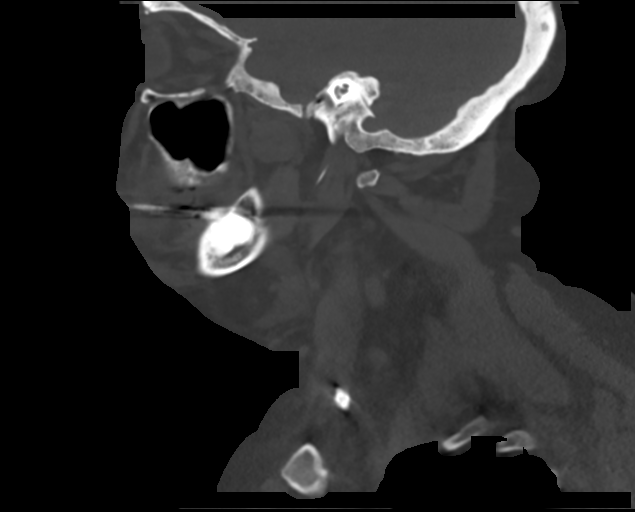

[Series 6: coronal · coronal · 0.45mm/px · 3 of 136 slices shown]
[im 28/136  bone]
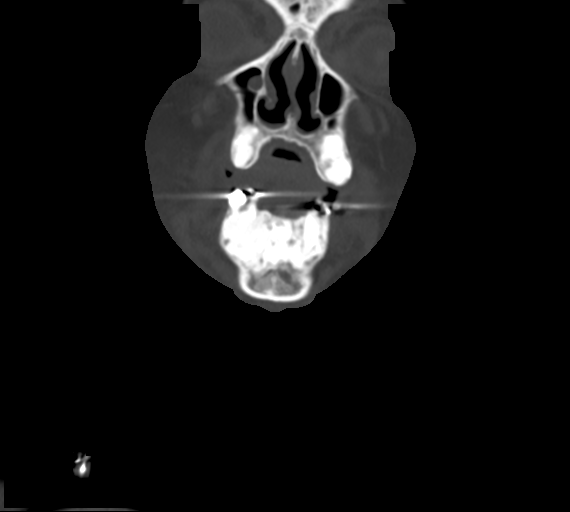
[im 55/136  bone]
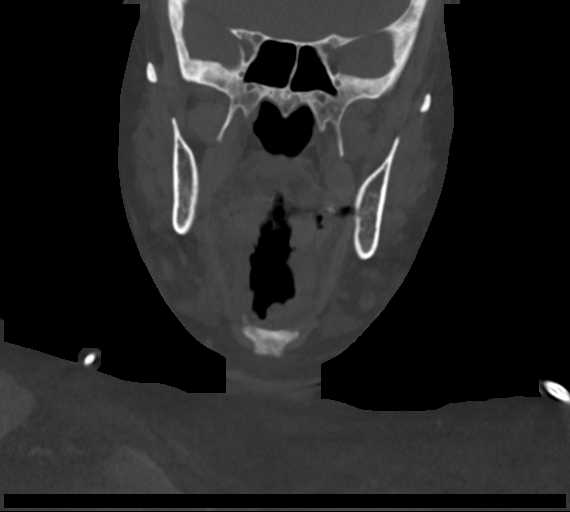
[im 82/136  bone]
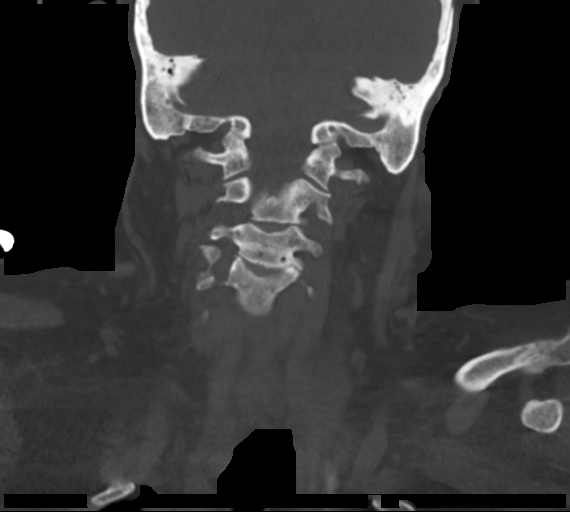

[Series 7: orthogonal · axial · 0.45mm/px · z∈[-195,-71]mm · 3 of 124 slices shown]
[im 31/124  bone]
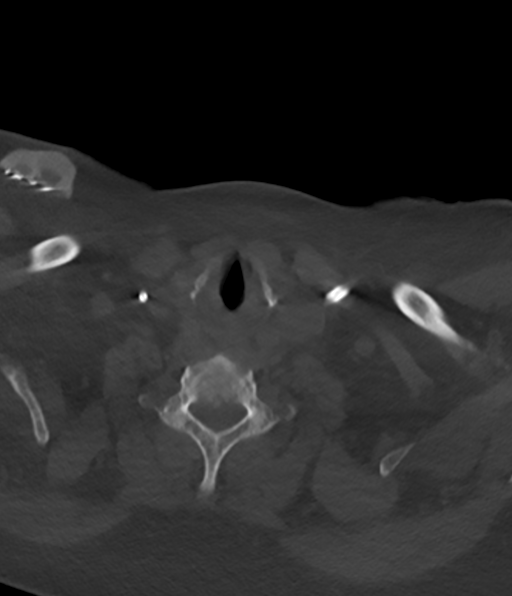
[im 62/124  bone]
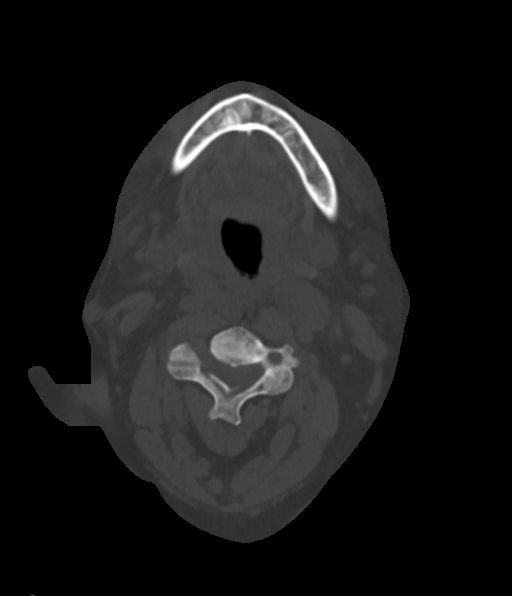
[im 93/124  bone]
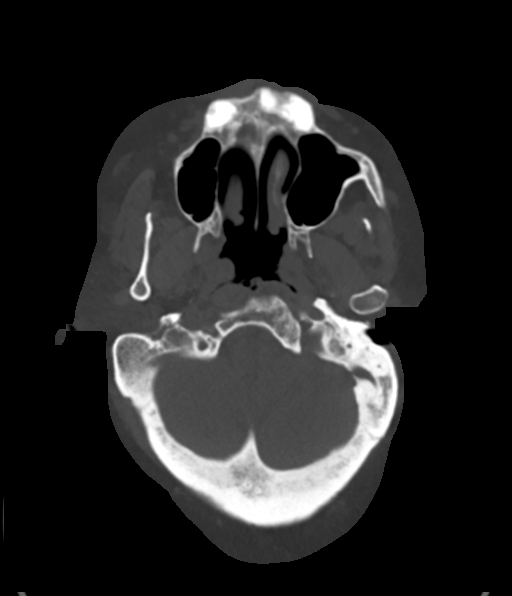

[14 of 33 positions shown; findings below may reference images not displayed]

FINDINGS: Pharynx and larynx: Oral cavity within normal limits. No evidence
for acute tonsillitis. The epiglottis is somewhat thickened and
edematous, more pronounced on the left (series 3, image 70).
Suggestion of mild mucosal edema within the adjacent oropharyngeal
mucosa. Hazy stranding seen within the retropharyngeal space,
consistent with edema and/or effusion. Findings concerning for acute
pharyngitis/supraglottitis. Oropharyngeal airway remains patent at
this time. Hazy stranding extends to involve the bilateral
parapharyngeal spaces, with additional inflammatory stranding
involving the right greater than left face at the level of the
masticator spaces. No visible loculated collections. Glottis itself
within normal limits. Subglottic airway clear.

Salivary glands: Salivary glands including the parotid and
submandibular glands within normal limits.

Thyroid: Visualized thyroid normal.

Lymph nodes: No enlarged or pathologic adenopathy within the neck.

Vascular: Left-sided dialysis catheter partially visualized.
Right-sided Port-A-Cath in place. Evaluation of the vascular
structures otherwise limited by lack of IV contrast. Minimal for age
atheromatous plaque about the left carotid bifurcation.

Limited intracranial: Unremarkable.

Visualized orbits: Unremarkable.

Mastoids and visualized paranasal sinuses: Paranasal sinuses are
largely clear. No significant mastoid effusion.

Skeleton: Prominent diffuse sclerosis seen about the maxilla and
mandible, suspected be related underlying dental disease. No other
discrete or worrisome osseous lesions.

Upper chest: Unremarkable.

Other: None.
IMPRESSION: Findings concerning for acute pharyngitis/supraglottitis, with
involvement of the left epiglottis. Associated hazy inflammatory
stranding within the retropharyngeal and parapharyngeal spaces, with
extension to involve the right greater than left masticator spaces.
No discrete abscess or drainable fluid collection seen on this
noncontrast examination. Supraglottic airway remains patent at this
time.

## 2022-08-10 ENCOUNTER — Emergency Department (HOSPITAL_COMMUNITY): Payer: Medicare HMO

## 2022-08-10 ENCOUNTER — Other Ambulatory Visit: Payer: Self-pay

## 2022-08-10 ENCOUNTER — Encounter (HOSPITAL_COMMUNITY): Payer: Self-pay | Admitting: Emergency Medicine

## 2022-08-10 ENCOUNTER — Emergency Department (HOSPITAL_COMMUNITY)
Admission: EM | Admit: 2022-08-10 | Discharge: 2022-08-11 | Disposition: A | Discharge status: Other Acute Inpatient Hospital | Payer: Medicare HMO | Attending: Emergency Medicine | Admitting: Emergency Medicine

## 2022-08-10 DIAGNOSIS — Z992 Dependence on renal dialysis: Secondary | ICD-10-CM | POA: Insufficient documentation

## 2022-08-10 DIAGNOSIS — C9100 Acute lymphoblastic leukemia not having achieved remission: Secondary | ICD-10-CM | POA: Insufficient documentation

## 2022-08-10 DIAGNOSIS — R29810 Facial weakness: Secondary | ICD-10-CM | POA: Diagnosis not present

## 2022-08-10 DIAGNOSIS — Z79899 Other long term (current) drug therapy: Secondary | ICD-10-CM | POA: Insufficient documentation

## 2022-08-10 DIAGNOSIS — R0602 Shortness of breath: Secondary | ICD-10-CM | POA: Diagnosis not present

## 2022-08-10 DIAGNOSIS — I120 Hypertensive chronic kidney disease with stage 5 chronic kidney disease or end stage renal disease: Secondary | ICD-10-CM | POA: Diagnosis not present

## 2022-08-10 DIAGNOSIS — N186 End stage renal disease: Secondary | ICD-10-CM | POA: Diagnosis not present

## 2022-08-10 DIAGNOSIS — R531 Weakness: Secondary | ICD-10-CM | POA: Diagnosis present

## 2022-08-10 LAB — CBC WITH DIFFERENTIAL/PLATELET
Abs Immature Granulocytes: 0.06 10*3/uL (ref 0.00–0.07)
Basophils Absolute: 0 10*3/uL (ref 0.0–0.1)
Basophils Relative: 0 %
Eosinophils Absolute: 0 10*3/uL (ref 0.0–0.5)
Eosinophils Relative: 0 %
HCT: 31 % — ABNORMAL LOW (ref 36.0–46.0)
Hemoglobin: 9.5 g/dL — ABNORMAL LOW (ref 12.0–15.0)
Immature Granulocytes: 1 %
Lymphocytes Relative: 6 %
Lymphs Abs: 0.4 10*3/uL — ABNORMAL LOW (ref 0.7–4.0)
MCH: 34.2 pg — ABNORMAL HIGH (ref 26.0–34.0)
MCHC: 30.6 g/dL (ref 30.0–36.0)
MCV: 111.5 fL — ABNORMAL HIGH (ref 80.0–100.0)
Monocytes Absolute: 0.9 10*3/uL (ref 0.1–1.0)
Monocytes Relative: 13 %
Neutro Abs: 5.2 10*3/uL (ref 1.7–7.7)
Neutrophils Relative %: 80 %
Platelets: 174 10*3/uL (ref 150–400)
RBC: 2.78 MIL/uL — ABNORMAL LOW (ref 3.87–5.11)
RDW: 16.8 % — ABNORMAL HIGH (ref 11.5–15.5)
WBC: 6.6 10*3/uL (ref 4.0–10.5)
nRBC: 0 % (ref 0.0–0.2)

## 2022-08-10 LAB — COMPREHENSIVE METABOLIC PANEL
ALT: 7 U/L (ref 0–44)
AST: 18 U/L (ref 15–41)
Albumin: 3 g/dL — ABNORMAL LOW (ref 3.5–5.0)
Alkaline Phosphatase: 121 U/L (ref 38–126)
Anion gap: 15 (ref 5–15)
BUN: 57 mg/dL — ABNORMAL HIGH (ref 8–23)
CO2: 20 mmol/L — ABNORMAL LOW (ref 22–32)
Calcium: 8.8 mg/dL — ABNORMAL LOW (ref 8.9–10.3)
Chloride: 107 mmol/L (ref 98–111)
Creatinine, Ser: 14.39 mg/dL — ABNORMAL HIGH (ref 0.44–1.00)
GFR, Estimated: 3 mL/min — ABNORMAL LOW (ref >60)
Glucose, Bld: 96 mg/dL (ref 70–99)
Potassium: 4.9 mmol/L (ref 3.5–5.1)
Sodium: 142 mmol/L (ref 135–145)
Total Bilirubin: 0.8 mg/dL (ref 0.3–1.2)
Total Protein: 4.7 g/dL — ABNORMAL LOW (ref 6.5–8.1)

## 2022-08-10 LAB — I-STAT CHEM 8, ED
BUN: 54 mg/dL — ABNORMAL HIGH (ref 8–23)
Calcium, Ion: 1.18 mmol/L (ref 1.15–1.40)
Chloride: 109 mmol/L (ref 98–111)
Creatinine, Ser: 15.5 mg/dL — ABNORMAL HIGH (ref 0.44–1.00)
Glucose, Bld: 94 mg/dL (ref 70–99)
HCT: 30 % — ABNORMAL LOW (ref 36.0–46.0)
Hemoglobin: 10.2 g/dL — ABNORMAL LOW (ref 12.0–15.0)
Potassium: 4.6 mmol/L (ref 3.5–5.1)
Sodium: 143 mmol/L (ref 135–145)
TCO2: 23 mmol/L (ref 22–32)

## 2022-08-10 LAB — ETHANOL: Alcohol, Ethyl (B): <10 mg/dL (ref <10)

## 2022-08-10 LAB — MAGNESIUM: Magnesium: 2.1 mg/dL (ref 1.7–2.4)

## 2022-08-10 LAB — LACTIC ACID, PLASMA: Lactic Acid, Venous: 2.9 mmol/L (ref 0.5–1.9)

## 2022-08-10 LAB — PHOSPHORUS: Phosphorus: 4.8 mg/dL — ABNORMAL HIGH (ref 2.5–4.6)

## 2022-08-10 LAB — URIC ACID: Uric Acid, Serum: 9.9 mg/dL — ABNORMAL HIGH (ref 2.5–7.1)

## 2022-08-10 LAB — LACTATE DEHYDROGENASE: LDH: 303 U/L — ABNORMAL HIGH (ref 98–192)

## 2022-08-10 NOTE — H&P (Incomplete)
Wyline Mood ZOX:[id number] DOB: Sep 30, 1960 DOA: 08/10/2022    PCP: Tracey Harries, MD   Outpatient Specialists: * NONE CARDS: * Dr. None  NEphrology: *  Dr. NEurology *   Dr. Pulmonary *  Dr.  Oncology . [facility]* Dr.  Deboraha Sprang, [facility]) No care team member to display Urology Dr. Theressa Stamps Mcdonald Patient arrived to ER on 08/10/22 at 2239 Referred by Attending Rondel Baton, MD   Patient coming from:    home Lives alone,   *** With family    Chief Complaint:   Chief Complaint  Patient presents with  . Shortness of Breath    HPI: Tracey Stevens is a 61 y.o. female with medical history significant of diffuse large B-cell lymphoma, hypertension, end-stage renal disease on hemodialysis, history of epiglottitis, hyperlipidemia    Presented with   fall slurred speech nausea vomiting diarrhea Patient had a fall today possible SVT in 160s associated left-sided weakness and slurred speech last known well was on Friday in the EMS had 1 episode of vomiting placed on on respirator Patient has missed her hemodialysis for the past 3 days past history of lymphoma in remission Blood pressure 126/100 Family did add that they were talking to her about 4 PM yesterday and at that time she had slurred speech Reports she has been having frequent nausea vomiting diarrhea and that is why she did not go to her dialysis Recently her chemotherapy has been changed by oncology      Denies significant ETOH intake *** Does not smoke*** but interested in quitting***  Lab Results  Component Value Date   SARSCOV2NAA NEGATIVE 03/22/2021   SARSCOV2NAA NEGATIVE 03/20/2021   SARSCOV2NAA NEGATIVE 12/11/2020       Regarding pertinent Chronic problems:    Hyperlipidemia -  on statins Lipitor (atorvastatin)      HTN on labetalol  ***chronic CHF diastolic/systolic/ combined - last echo*** No results found for this or any previous visit (from the past 81191 hour(s)). Entresto *** CAD  - On  Aspirin, statin, betablocker, Plavix                 - *followed by cardiology                - last cardiac cath       Morbid obesity-   BMI Readings from Last 1 Encounters:  08/10/22 42.17 kg/m     *** Asthma -well *** controlled on home inhalers/ nebs                         ***last no prior***admission  ***                       No ***history of intubation  *** COPD - not **followed by pulmonology *** not  on baseline oxygen  *L,    *** OSA -on nocturnal oxygen, *CPAP, *noncompliant with CPAP  End-stage renal disease  Lab Results  Component Value Date   CREATININE 15.50 (H) 08/10/2022   CREATININE 14.39 (H) 08/10/2022   CREATININE 9.71 (H) 08/06/2021     Chronic anemia - baseline hg Hemoglobin & Hematocrit  Recent Labs    08/10/22 2249 08/10/22 2253  HGB 9.5* 10.2*        While in ER:   Found to have cardiomegaly on chest x-ray but no infiltrates CT head unremarkable ventricular catheter positioned in right ventricle    Lab Orders  Blood culture (routine x 2)         CBC with Differential         Comprehensive metabolic panel         Ethanol         Rapid urine drug screen (hospital performed)         Lactic acid, plasma         Phosphorus         Uric acid         Lactate dehydrogenase         Magnesium         I-stat chem 8, ED (not at [facility], [facility] or [facility])      CT HEAD   NON acute    CXR -  NON acute    Following Medications were ordered in ER: Medications - No data to display  _______________________________________________________ ER Provider Called:     DrMarland Kitchen  They Recommend admit to medicine *** Will see in AM  ***SEEN in ER   ED Triage Vitals  Enc Vitals Group     BP 08/10/22 2245 119/69     Pulse Rate 08/10/22 2245 (!) 134     Resp 08/10/22 2300 (!) 24     Temp 08/10/22 2250 97.7 F (36.5 C)     Temp Source 08/10/22 2250 Oral     SpO2 08/10/22 2245 90 %     Weight 08/10/22 2248 (!) 319 lb 10.7 oz (145 kg)     Height 08/10/22  2248 6\' 1"  (1.854 m)     Head Circumference --      Peak Flow --      Pain Score 08/10/22 2248 0     Pain Loc --      Pain Edu? --      Excl. in GC? --   TMAX(24)@     _________________________________________ Significant initial  Findings: Abnormal Labs Reviewed  CBC WITH DIFFERENTIAL/PLATELET - Abnormal; Notable for the following components:      Result Value   RBC 2.78 (*)    Hemoglobin 9.5 (*)    HCT 31.0 (*)    MCV 111.5 (*)    MCH 34.2 (*)    RDW 16.8 (*)    Lymphs Abs 0.4 (*)    All other components within normal limits  COMPREHENSIVE METABOLIC PANEL - Abnormal; Notable for the following components:   CO2 20 (*)    BUN 57 (*)    Creatinine, Ser 14.39 (*)    Calcium 8.8 (*)    Total Protein 4.7 (*)    Albumin 3.0 (*)    GFR, Estimated 3 (*)    All other components within normal limits  LACTIC ACID, PLASMA - Abnormal; Notable for the following components:   Lactic Acid, Venous 2.9 (*)    All other components within normal limits  PHOSPHORUS - Abnormal; Notable for the following components:   Phosphorus 4.8 (*)    All other components within normal limits  URIC ACID - Abnormal; Notable for the following components:   Uric Acid, Serum 9.9 (*)    All other components within normal limits  LACTATE DEHYDROGENASE - Abnormal; Notable for the following components:   LDH 303 (*)    All other components within normal limits  I-STAT CHEM 8, ED - Abnormal; Notable for the following components:   BUN 54 (*)    Creatinine, Ser 15.50 (*)    Hemoglobin 10.2 (*)    HCT 30.0 (*)  All other components within normal limits      _________________________ Troponin ***ordered Cardiac Panel (last 3 results) No results for input(s): "CKTOTAL", "CKMB", "TROPONINIHS", "RELINDX" in the last 72 hours.   ECG: Ordered Personally reviewed and interpreted by me showing: HR : *** Rhythm: *NSR, Sinus tachycardia * A.fib. W RVR, RBBB, LBBB, Paced Ischemic changes*nonspecific changes,  no evidence of ischemic changes QTC*  BNP (last 3 results) No results for input(s): "BNP" in the last 8760 hours.   COVID-19 Labs  Recent Labs    08/10/22 2249  LDH 303*    Lab Results  Component Value Date   SARSCOV2NAA NEGATIVE 03/22/2021   SARSCOV2NAA NEGATIVE 03/20/2021   SARSCOV2NAA NEGATIVE 12/11/2020       ____________________ This patient meets SIRS Criteria and may be septic. SIRS = Systemic Inflammatory Response Syndrome  Order a lactic acid level if needed AND/OR Initiate the sepsis protocol with the attached order set OR Click "Treating Associated Infection or Illness" if the patient is being treated for an infection that is a known cause of these abnormalities     The recent clinical data is shown below. Vitals:   08/10/22 2248 08/10/22 2250 08/10/22 2300 08/10/22 2315  BP:   118/86 (!) 146/104  Pulse:   (!) 119 (!) 129  Resp:   (!) 24 (!) 27  Temp:  97.7 F (36.5 C)    TempSrc:  Oral    SpO2:   98% 96%  Weight: (!) 145 kg     Height: 6\' 1"  (1.854 m)           WBC     Component Value Date/Time   WBC 6.6 08/10/2022 2249   LYMPHSABS 0.4 (L) 08/10/2022 2249   LYMPHSABS 1.7 01/05/2017 1047   MONOABS 0.9 08/10/2022 2249   MONOABS 0.4 01/05/2017 1047   EOSABS 0.0 08/10/2022 2249   EOSABS 0.2 01/05/2017 1047   BASOSABS 0.0 08/10/2022 2249   BASOSABS 0.0 01/05/2017 1047        Lactic Acid, Venous    Component Value Date/Time   LATICACIDVEN 2.9 (HH) 08/10/2022 2255      Lactic Acid, Venous    Component Value Date/Time   LATICACIDVEN 2.9 (HH) 08/10/2022 2255    Procalcitonin *** Ordered      UA *** no evidence of UTI  ***Pending ***not ordered   Urine analysis:    Component Value Date/Time   COLORURINE YELLOW (A) 12/11/2020 2129   APPEARANCEUR CLEAR (A) 12/11/2020 2129   LABSPEC 1.015 12/11/2020 2129   PHURINE 6.0 12/11/2020 2129   GLUCOSEU NEGATIVE 12/11/2020 2129   HGBUR MODERATE (A) 12/11/2020 2129   BILIRUBINUR  NEGATIVE 12/11/2020 2129   KETONESUR NEGATIVE 12/11/2020 2129   PROTEINUR >300 (A) 12/11/2020 2129   NITRITE NEGATIVE 12/11/2020 2129   LEUKOCYTESUR NEGATIVE 12/11/2020 2129    Results for orders placed or performed during the hospital encounter of 03/20/21  Resp Panel by RT-PCR (Flu A&B, Covid) Peripheral     Status: None   Collection Time: 03/20/21  8:02 PM   Specimen: Peripheral; Nasopharyngeal(NP) swabs in vial transport medium  Result Value Ref Range Status   SARS Coronavirus 2 by RT PCR NEGATIVE NEGATIVE Final    Comment: (NOTE) SARS-CoV-2 target nucleic acids are NOT DETECTED.  The SARS-CoV-2 RNA is generally detectable in upper respiratory specimens during the acute phase of infection. The lowest concentration of SARS-CoV-2 viral copies this assay can detect is 138 copies/mL. A negative result does not preclude SARS-Cov-2 infection  and should not be used as the sole basis for treatment or other patient management decisions. A negative result may occur with  improper specimen collection/handling, submission of specimen other than nasopharyngeal swab, presence of viral mutation(s) within the areas targeted by this assay, and inadequate number of viral copies(<138 copies/mL). A negative result must be combined with clinical observations, patient history, and epidemiological information. The expected result is Negative.  Fact Sheet for Patients:  BloggerCourse.com  Fact Sheet for Healthcare Providers:  SeriousBroker.it  This test is no t yet approved or cleared by the [place] FDA and  has been authorized for detection and/or diagnosis of SARS-CoV-2 by FDA under an Emergency Use Authorization (EUA). This EUA will remain  in effect (meaning this test can be used) for the duration of the COVID-19 declaration under Section 564(b)(1) of the Act, 21 U.S.C.section 360bbb-3(b)(1), unless the authorization is terminated  or  revoked sooner.       Influenza A by PCR NEGATIVE NEGATIVE Final   Influenza B by PCR NEGATIVE NEGATIVE Final    Comment: (NOTE) The Xpert Xpress SARS-CoV-2/FLU/RSV plus assay is intended as an aid in the diagnosis of influenza from Nasopharyngeal swab specimens and should not be used as a sole basis for treatment. Nasal washings and aspirates are unacceptable for Xpert Xpress SARS-CoV-2/FLU/RSV testing.  Fact Sheet for Patients: BloggerCourse.com  Fact Sheet for Healthcare Providers: SeriousBroker.it  This test is not yet approved or cleared by the [place] FDA and has been authorized for detection and/or diagnosis of SARS-CoV-2 by FDA under an Emergency Use Authorization (EUA). This EUA will remain in effect (meaning this test can be used) for the duration of the COVID-19 declaration under Section 564(b)(1) of the Act, 21 U.S.C. section 360bbb-3(b)(1), unless the authorization is terminated or revoked.  Performed at [facility], [address], [place], [place] [address]   Culture, blood (routine x 2)     Status: None   Collection Time: 03/20/21  8:15 PM   Specimen: BLOOD LEFT ARM  Result Value Ref Range Status   Specimen Description BLOOD LEFT ARM  Final   Special Requests   Final    BOTTLES DRAWN AEROBIC AND ANAEROBIC Blood Culture results may not be optimal due to an inadequate volume of blood received in culture bottles   Culture   Final    NO GROWTH 5 DAYS Performed at [facility], [address], [place], [place] [address]    Report Status 03/25/2021 FINAL  Final  Culture, blood (routine x 2)     Status: None   Collection Time: 03/20/21  8:16 PM   Specimen: BLOOD LEFT HAND  Result Value Ref Range Status   Specimen Description BLOOD LEFT HAND  Final   Special Requests   Final    BOTTLES DRAWN AEROBIC ONLY Blood Culture results may not be optimal due to an inadequate volume of blood received in  culture bottles   Culture   Final    NO GROWTH 5 DAYS Performed at [facility], [address] [place], [place] [address]    Report Status 03/25/2021 FINAL  Final  MRSA Next Gen by PCR, Nasal     Status: None   Collection Time: 03/21/21 11:54 AM   Specimen: Nasal Mucosa; Nasal Swab  Result Value Ref Range Status   MRSA by PCR Next Gen NOT DETECTED NOT DETECTED Final    Comment: (NOTE) The GeneXpert MRSA Assay (FDA approved for NASAL specimens  only), is one component of a comprehensive MRSA colonization surveillance program. It is not intended to diagnose MRSA infection nor to guide or monitor treatment for MRSA infections. Test performance is not FDA approved in patients less than 85 years old. Performed at [facility], [address], [place], [place] [address]   Resp Panel by RT-PCR (Flu A&B, Covid) Nasopharyngeal Swab     Status: None   Collection Time: 03/22/21 10:53 AM   Specimen: Nasopharyngeal Swab; Nasopharyngeal(NP) swabs in vial transport medium  Result Value Ref Range Status   SARS Coronavirus 2 by RT PCR NEGATIVE NEGATIVE Final    Comment: (NOTE) SARS-CoV-2 target nucleic acids are NOT DETECTED.  The SARS-CoV-2 RNA is generally detectable in upper respiratory specimens during the acute phase of infection. The lowest concentration of SARS-CoV-2 viral copies this assay can detect is 138 copies/mL. A negative result does not preclude SARS-Cov-2 infection and should not be used as the sole basis for treatment or other patient management decisions. A negative result may occur with  improper specimen collection/handling, submission of specimen other than nasopharyngeal swab, presence of viral mutation(s) within the areas targeted by this assay, and inadequate number of viral copies(<138 copies/mL). A negative result must be combined with clinical observations, patient history, and epidemiological information. The expected result is Negative.  Fact Sheet  for Patients:  BloggerCourse.com  Fact Sheet for Healthcare Providers:  SeriousBroker.it  This test is no t yet approved or cleared by the [place] FDA and  has been authorized for detection and/or diagnosis of SARS-CoV-2 by FDA under an Emergency Use Authorization (EUA). This EUA will remain  in effect (meaning this test can be used) for the duration of the COVID-19 declaration under Section 564(b)(1) of the Act, 21 U.S.C.section 360bbb-3(b)(1), unless the authorization is terminated  or revoked sooner.       Influenza A by PCR NEGATIVE NEGATIVE Final   Influenza B by PCR NEGATIVE NEGATIVE Final    Comment: (NOTE) The Xpert Xpress SARS-CoV-2/FLU/RSV plus assay is intended as an aid in the diagnosis of influenza from Nasopharyngeal swab specimens and should not be used as a sole basis for treatment. Nasal washings and aspirates are unacceptable for Xpert Xpress SARS-CoV-2/FLU/RSV testing.  Fact Sheet for Patients: BloggerCourse.com  Fact Sheet for Healthcare Providers: SeriousBroker.it  This test is not yet approved or cleared by the [place] FDA and has been authorized for detection and/or diagnosis of SARS-CoV-2 by FDA under an Emergency Use Authorization (EUA). This EUA will remain in effect (meaning this test can be used) for the duration of the COVID-19 declaration under Section 564(b)(1) of the Act, 21 U.S.C. section 360bbb-3(b)(1), unless the authorization is terminated or revoked.  Performed at [facility], [address], [place], [place] [address]     ABX started Antibiotics Given (last 72 hours)     None       No results found for the last 90 days.     ________________________________________________________________  Arterial ***Venous  Blood Gas result:  pH *** pCO2 ***; pO2 ***;     %O2 Sat ***.  ABG    Component Value Date/Time    TCO2 23 08/10/2022 2253       __________________________________________________________ Recent Labs  Lab 08/10/22 2249 08/10/22 2253  NA 142 143  K 4.9 4.6  CO2 20*  --   GLUCOSE 96 94  BUN 57* 54*  CREATININE 14.39* 15.50*  CALCIUM 8.8*  --   MG 2.1  --  PHOS 4.8*  --     Cr  * stable,  Up from baseline see below Lab Results  Component Value Date   CREATININE 15.50 (H) 08/10/2022   CREATININE 14.39 (H) 08/10/2022   CREATININE 9.71 (H) 08/06/2021    Recent Labs  Lab 08/10/22 2249  AST 18  ALT 7  ALKPHOS 121  BILITOT 0.8  PROT 4.7*  ALBUMIN 3.0*   Lab Results  Component Value Date   CALCIUM 8.8 (L) 08/10/2022   PHOS 4.8 (H) 08/10/2022          Plt: Lab Results  Component Value Date   PLT 174 08/10/2022         Recent Labs  Lab 08/10/22 2249 08/10/22 2253  WBC 6.6  --   NEUTROABS 5.2  --   HGB 9.5* 10.2*  HCT 31.0* 30.0*  MCV 111.5*  --   PLT 174  --     HG/HCT * stable,  Down *Up from baseline see below    Component Value Date/Time   HGB 10.2 (L) 08/10/2022 2253   HGB 8.4 (L) 09/11/2020 1609   HGB 13.1 01/05/2017 1047   HCT 30.0 (L) 08/10/2022 2253   HCT 38.9 01/05/2017 1047   MCV 111.5 (H) 08/10/2022 2249   MCV 91.1 01/05/2017 1047      No results for input(s): "LIPASE", "AMYLASE" in the last 168 hours. No results for input(s): "AMMONIA" in the last 168 hours.    .lab  _______________________________________________ Hospitalist was called for admission for *** There are no diagnoses linked to this encounter.   The following Work up has been ordered so far:  Orders Placed This Encounter  Procedures  . Blood culture (routine x 2)  . DG Chest Port 1 View  . CT HEAD WO CONTRAST ( )  . CBC with Differential  . Comprehensive metabolic panel  . Ethanol  . Rapid urine drug screen (hospital performed)  . Lactic acid, plasma  . Phosphorus  . Uric acid  . Lactate dehydrogenase  . Magnesium  . Consult to hospitalist   . I-stat chem 8, ED (not at [facility], [facility] or [facility])  . EKG 12-Lead  . EKG 12-Lead     OTHER Significant initial  Findings:  labs showing:     DM  labs:  HbA1C: No results for input(s): "HGBA1C" in the last 8760 hours.     CBG (last 3)  No results for input(s): "GLUCAP" in the last 72 hours.        Cultures:    Component Value Date/Time   SDES BLOOD LEFT HAND 03/20/2021 2016   SPECREQUEST  03/20/2021 2016    BOTTLES DRAWN AEROBIC ONLY Blood Culture results may not be optimal due to an inadequate volume of blood received in culture bottles   CULT  03/20/2021 2016    NO GROWTH 5 DAYS Performed at [facility], [address] [place], [place] [address]    REPTSTATUS 03/25/2021 FINAL 03/20/2021 2016     Radiological Exams on Admission: CT HEAD WO CONTRAST ( )  Result Date: 08/10/2022 CLINICAL DATA:  Left-sided weakness. EXAM: CT HEAD WITHOUT CONTRAST TECHNIQUE: Contiguous axial images were obtained from the base of the skull through the vertex without intravenous contrast. RADIATION DOSE REDUCTION: This exam was performed according to the departmental dose-optimization program which includes automated exposure control, adjustment of the mA and/or kV according to patient size and/or use of iterative reconstruction technique. COMPARISON:  None Available. FINDINGS: Brain: There is mild cerebral  atrophy with widening of the extra-axial spaces and ventricular dilatation. There are areas of decreased attenuation within the white matter tracts of the supratentorial brain, consistent with microvascular disease changes. A ventriculostomy catheter is seen entering via a right frontal burr hole. Its distal tip is located within the medial aspect of the mid right lateral ventricle. Vascular: No hyperdense vessel or unexpected calcification. Skull: The skull is diffusely sclerotic with moderate severity diffuse hyperostosis. Sinuses/Orbits: No acute finding. Other: None. IMPRESSION: 1. No acute  intracranial abnormality. 2. Ventriculostomy catheter positioning, as described above. 3. Generalized cerebral atrophy with widening of the extra-axial spaces and ventricular dilatation. Electronically Signed   By: Aram Candela M.D.   On: 08/10/2022 23:19   DG Chest Port 1 View  Result Date: 08/10/2022 CLINICAL DATA:  Stroke symptoms. Vomiting. Shortness of breath. EXAM: PORTABLE CHEST 1 VIEW COMPARISON:  Chest radiograph 08/06/2021 FINDINGS: Right chest port with tip in the right atrium. Left-sided dialysis catheter with tip overlying the atrial caval junction. Lower lung volumes from prior exam. Cardiomegaly is similar. Bronchovascular crowding versus vascular congestion. No evidence of focal airspace disease, large pleural effusion or visible pneumothorax. IMPRESSION: Lower lung volumes from prior exam with bronchovascular crowding versus vascular congestion. Stable cardiomegaly. Electronically Signed   By: Narda Rutherford M.D.   On: 08/10/2022 23:04   _______________________________________________________________________________________________________ Latest  Blood pressure (!) 146/104, pulse (!) 129, temperature 97.7 F (36.5 C), temperature source Oral, resp. rate (!) 27, height 6\' 1"  (1.854 m), weight (!) 145 kg, SpO2 96 %.   Vitals  labs and radiology finding personally reviewed  Review of Systems:    Pertinent positives include: ***  Constitutional:  No weight loss, night sweats, Fevers, chills, fatigue, weight loss  HEENT:  No headaches, Difficulty swallowing,Tooth/dental problems,Sore throat,  No sneezing, itching, ear ache, nasal congestion, post nasal drip,  Cardio-vascular:  No chest pain, Orthopnea, PND, anasarca, dizziness, palpitations.no Bilateral lower extremity swelling  GI:  No heartburn, indigestion, abdominal pain, nausea, vomiting, diarrhea, change in bowel habits, loss of appetite, melena, blood in stool, hematemesis Resp:  no shortness of breath at rest. No  dyspnea on exertion, No excess mucus, no productive cough, No non-productive cough, No coughing up of blood.No change in color of mucus.No wheezing. Skin:  no rash or lesions. No jaundice GU:  no dysuria, change in color of urine, no urgency or frequency. No straining to urinate.  No flank pain.  Musculoskeletal:  No joint pain or no joint swelling. No decreased range of motion. No back pain.  Psych:  No change in mood or affect. No depression or anxiety. No memory loss.  Neuro: no localizing neurological complaints, no tingling, no weakness, no double vision, no gait abnormality, no slurred speech, no confusion  All systems reviewed and apart from HOPI all are negative _______________________________________________________________________________________________ Past Medical History:   Past Medical History:  Diagnosis Date  . Acute lymphoblastic leukemia (ALL) (HCC)   . Adrenal nodule (HCC)   . Anemia   . Cancer (HCC)    cervical  . Congestive heart failure (CHF) (HCC)   . Diffuse Large B Cell Lymphoma 2015 remission  . Dyspnea    SOB when she over exerts herself  . ESRD on dialysis [facility])    M-W-F  . Hearing loss   . History of blood transfusion   . Hyperparathyroidism (HCC)    s/p parathyroidectomy  . Hypertension   . Hypertension   . Multinodular goiter   . Multiple renal cysts   .  Nephrolithiasis   . On antineoplastic chemotherapy   . Pre-diabetes    Most recent A1C 6.7- 09/13/20. No blood sugar checks  . Vertigo   . Vitamin D deficiency       Past Surgical History:  Procedure Laterality Date  . ABDOMINAL HYSTERECTOMY    . BREAST BIOPSY Right over 10 years ago   benign  . Left ear surgery    . LIVER BIOPSY  2014   confirmed to be DLBCL  . PARATHYROIDECTOMY    . TUBAL LIGATION      Social History:  Ambulatory *** independently cane, walker  wheelchair bound, bed bound     reports that she has never smoked. She has never used smokeless tobacco. She  reports current alcohol use. She reports that she does not use drugs.     Family History: *** Family History  Problem Relation Age of Onset  . Heart disease Mother   . Hypertension Mother   . Gastric cancer Father   . Renal Disease Father   . Cancer Maternal Grandmother   . Leukemia Maternal Grandfather   . Cancer Paternal Grandmother   . Cancer Paternal Grandfather    ______________________________________________________________________________________________ Allergies: Allergies  Allergen Reactions  . Other Other (See Comments)    Transparent dressing - skin tear  . Amlodipine Swelling    Peripheral edema, ankles swell   . Methotrexate Dermatitis     Prior to Admission medications   Medication Sig Start Date End Date Taking? Authorizing Provider  acyclovir (ZOVIRAX) 400 MG tablet Take 1 tablet (400 mg) by mouth once daily after 5 pm. Take while neutropenic as directed by oncologist. 11/20/21   [provider]  atorvastatin (LIPITOR) 20 MG tablet Take 20 mg by mouth daily. 04/01/21   [provider]  benzonatate (TESSALON) 100 MG capsule Take 1 capsule by mouth 3 (three) times daily as needed. 07/24/21   [provider]  cinacalcet (SENSIPAR) 30 MG tablet Take 30 mg by mouth daily.    [provider]  cyanocobalamin 1000 MCG tablet Take by mouth. 06/19/21   [provider]  dapsone 100 MG tablet Take 100 mg by mouth daily.    [provider]  dexamethasone (DECADRON) 2 MG tablet Take 3 tablets (6mg ) on 02/20/22 and 1 tablet (2mg ) on 02/21/22. 02/19/22   [provider]  diclofenac Sodium (VOLTAREN) 1 % GEL Apply topically.    [provider]  docusate sodium (COLACE) 100 MG capsule Take 1 capsule (100 mg total) by mouth 2 (two) times daily as needed for mild constipation. Patient not taking: Reported on 06/03/2021 03/22/21   Lorin Glass, MD  ENTRESTO 24-26 MG Take by mouth. 09/08/21   [provider]   gabapentin (NEURONTIN) 300 MG capsule Take 300 mg by mouth at bedtime. 05/23/21   [provider]  labetalol (NORMODYNE) 5 MG/ML injection Inject 2 mLs (10 mg total) into the vein every 2 (two) hours as needed (SBP > 180 or DBP > 100; HR must be > 70 to administer). Patient not taking: Reported on 06/03/2021 03/22/21   Lorin Glass, MD  lidocaine, PF, (XYLOCAINE) 1 % SOLN injection Inject 5 mLs into the skin as needed (topical anesthesia for hemodialysis if GEBAUERS is ineffective.). Patient not taking: Reported on 06/03/2021 03/22/21   Lorin Glass, MD  lidocaine-prilocaine (EMLA) cream Apply 1 application topically as needed (topical anesthesia for hemodialysis if Gebauers and Lidocaine injection are ineffective.). Patient not taking: Reported on 06/03/2021  03/22/21   Lorin Glass, MD  magic mouthwash w/lidocaine SOLN Take 5 mLs by mouth 3 (three) times daily as needed for mouth pain. Patient not taking: Reported on 06/03/2021 03/22/21   Lorin Glass, MD  Mouthwashes (MOUTH RINSE) LIQD solution 15 mLs by Mouth Rinse route 2 (two) times daily. Patient not taking: Reported on 06/03/2021 03/22/21   Lorin Glass, MD  ondansetron [facility]) 4 MG/2ML SOLN injection Inject 2 mLs (4 mg total) into the vein every 8 (eight) hours as needed for nausea or vomiting. Patient not taking: Reported on 06/03/2021 03/22/21   Lorin Glass, MD  pantoprazole (PROTONIX) 40 MG injection Inject 40 mg into the vein daily. Patient not taking: Reported on 06/03/2021 03/23/21   Lorin Glass, MD  pentafluoroprop-tetrafluoroeth Peggye Pitt) AERO Apply 1 application topically as needed (topical anesthesia for hemodialysis). Patient not taking: Reported on 06/03/2021 03/22/21   Lorin Glass, MD  polyethylene glycol (MIRALAX / GLYCOLAX) 17 g packet Take 17 g by mouth daily as needed for moderate constipation. Patient not taking: Reported on 06/03/2021 03/22/21   Lorin Glass, MD  sevelamer carbonate (RENVELA) 800 MG tablet  Take by mouth.    [provider]  Tbo-Filgrastim (GRANIX) 480 MCG/0.8ML SOSY injection Inject 0.8 mLs (480 mcg total) into the skin daily at 6 (six) AM. Patient not taking: Reported on 06/03/2021 03/22/21   Lorin Glass, MD  traMADol (ULTRAM) 50 MG tablet Take 50 mg by mouth 3 (three) times daily as needed.    [provider]    ___________________________________________________________________________________________________ Physical Exam:    08/10/2022   11:15 PM 08/10/2022   11:00 PM 08/10/2022   10:48 PM  Vitals with BMI  Height   6\' 1"   Weight   319 lbs 11 oz  BMI   42.18  Systolic 146 118   Diastolic 104 86   Pulse 129 119      1. General:  in No ***Acute distress***increased work of breathing ***complaining of severe pain****agitated * Chronically ill *well *cachectic *toxic acutely ill -appearing 2. Psychological: Alert and *** Oriented 3. Head/ENT:   Moist *** Dry Mucous Membranes                          Head Non traumatic, neck supple                          Normal *** Poor Dentition 4. SKIN: normal *** decreased Skin turgor,  Skin clean Dry and intact no rash    5. Heart: Regular rate and rhythm no*** Murmur, no Rub or gallop 6. Lungs: ***Clear to auscultation bilaterally, no wheezes or crackles   7. Abdomen: Soft, ***non-tender, Non distended *** obese ***bowel sounds present 8. Lower extremities: no clubbing, cyanosis, no ***edema 9. Neurologically Grossly intact, moving all 4 extremities equally *** strength 5 out of 5 in all 4 extremities cranial nerves II through XII intact 10. MSK: Normal range of motion    Chart has been reviewed  ______________________________________________________________________________________________  Assessment/Plan  ***  Admitted for *** There are no diagnoses linked to this encounter.   Present on Admission: **None**     No problem-specific Assessment & Plan notes found for this  encounter.    Other plan as per orders.  DVT prophylaxis:  SCD *** Lovenox       Code Status:    Code Status: Prior FULL CODE ***  DNR/DNI ***comfort care as per patient ***family  I had personally discussed CODE STATUS with patient and family* I had spent *min discussing goals of care and CODE STATUS ACP has been reviewed ***   Family Communication:   Family not at  Bedside  plan of care was discussed on the phone with *** Son, Daughter, Wife, Husband, Sister, Brother , father, mother  Diet    Disposition Plan:   *** likely will need placement for rehabilitation                          Back to current facility when stable                            To home once workup is complete and patient is stable  ***Following barriers for discharge:                            Electrolytes corrected                               Anemia corrected                             Pain controlled with PO medications                               Afebrile, white count improving able to transition to PO antibiotics                             Will need to be able to tolerate PO                            Will likely need home health, home O2, set up                           Will need consultants to evaluate patient prior to discharge  ****EXPECT DC tomorrow       Consult Orders  (From admission, onward)           Start     Ordered   08/10/22 2349  Consult to hospitalist  Once       Provider:  (Not yet assigned)  Question Answer Comment  Place call to: [facility]   Reason for Consult Admit      08/10/22 2348                              ***Would benefit from PT/OT eval prior to DC  Ordered                   Swallow eval - SLP ordered                   Diabetes care coordinator                   Transition of care consulted                   Nutrition    consulted  Wound care  consulted                   Palliative care    consulted                    Behavioral health  consulted                    Consults called: ***     Admission status:  ED Disposition     ED Disposition  Admit   Condition  --   Comment  The patient appears reasonably stabilized for admission considering the current resources, flow, and capabilities available in the ED at this time, and I doubt any other [facility] requiring further screening and/or treatment in the ED prior to admission is  present.           Obs***  ***  inpatient     I Expect 2 midnight stay secondary to severity of patient's current illness need for inpatient interventions justified by the following: ***hemodynamic instability despite optimal treatment (tachycardia *hypotension * tachypnea *hypoxia, hypercapnia) * Severe lab/radiological/exam abnormalities including:     and extensive comorbidities including: *substance abuse  *Chronic pain *DM2  * CHF * CAD  * COPD/asthma *Morbid Obesity * CKD *dementia *liver disease *history of stroke with residual deficits *  malignancy, * sickle cell disease  History of amputation Chronic anticoagulation  That are currently affecting medical management.   I expect  patient to be hospitalized for 2 midnights requiring inpatient medical care.  Patient is at high risk for adverse outcome (such as loss of life or disability) if not treated.  Indication for inpatient stay as follows:  Severe change from baseline regarding mental status Hemodynamic instability despite maximal medical therapy,  ongoing suicidal ideations,  severe pain requiring acute inpatient management,  inability to maintain oral hydration   persistent chest pain despite medical management Need for operative/procedural  intervention New or worsening hypoxia   Need for IV antibiotics, IV fluids, IV rate controling medications, IV antihypertensives, IV pain medications, IV anticoagulation, need for biPAP    Level of care   *** tele  For 12H 24H     medical  floor       progressive tele indefinitely please discontinue once patient no longer qualifies COVID-19 Labs    Lab Results  Component Value Date   SARSCOV2NAA NEGATIVE 03/22/2021     Precautions: admitted as *** Covid Negative  ***asymptomatic screening protocol****PUI *** covid positive No active isolations ***If Covid PCR is negative  - please DC precautions - would need additional investigation given very high risk for false native test result    Critical***  Patient is critically ill due to  hemodynamic instability * respiratory failure *severe sepsis* ongoing chest pain*  They are at high risk for life/limb threatening clinical deterioration requiring frequent reassessment and modifications of care.  Services provided include examination of the patient, review of relevant ancillary tests, prescription of lifesaving therapies, review of medications and prophylactic therapy.  Total critical care time excluding separately billable procedures: 60*  Minutes.    Dshaun Reppucci 08/11/2022, 12:00 AM ***  [facility]     after 2 AM please page floor coverage PA If 7AM-7PM, please contact the day team taking care of the patient using [email or web address]

## 2022-08-10 NOTE — ED Notes (Signed)
Patient's caretaker at bedside.

## 2022-08-10 NOTE — Subjective & Objective (Signed)
Patient had a fall today possible SVT in 160s associated left-sided weakness and slurred speech last known well was on Friday in the EMS had 1 episode of vomiting placed on on respirator Patient has missed her hemodialysis for the past 3 days past history of lymphoma in remission Blood pressure 126/100 Family did add that they were talking to her about 4 PM yesterday and at that time she had slurred speech Reports she has been having frequent nausea vomiting diarrhea and that is why she did not go to her dialysis Recently her chemotherapy has been changed by oncology

## 2022-08-10 NOTE — ED Triage Notes (Signed)
Patient BIB [facility] for multiple complaints.  EMS initially called out for fall, BLS unit responded, found patient to be in a possible SVT rate so they called for ALS.  ALS unit reports left side weakness and slurred speech, LKW on Friday.  Patient had one episode of vomiting with EMS.  Patient placed on NRB.  Patient reports she has missed 3 dialysis treatments d/t not feeling well.   CBG 122 126/100 200 mL of NaCl

## 2022-08-10 NOTE — ED Provider Notes (Signed)
[place] EMERGENCY DEPARTMENT AT [facility] Provider Note   CSN: [id number] Arrival date & time: 08/10/22  2239     History {Add pertinent medical, surgical, social history, OB history to HPI:1} Chief Complaint  Patient presents with   Shortness of Breath    Tracey Stevens is a 62 y.o. female.  The history is provided by the patient and medical records.  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  EMS initially called out for a fall, BLS unit responded and found patient to be in SVT with rate 160's so another unit called.  Then she was found to have left sided weakness and slurred speech.  No one has physically seen her since Friday, however family talked to her on the phone yesterday around 4PM and speech slurred at that time.  She did have some vomiting with EMS.  She has missed her last 3 HD sessions due to N/V/D and overall not feeling well.  Apparently some of her chemo treatments were recently changed by oncology.  She was given 250cc with EMS, tachycardia improved to 120's.  Home Medications Prior to Admission medications   Medication Sig Start Date End Date Taking? Authorizing Provider  acyclovir (ZOVIRAX) 400 MG tablet Take 1 tablet (400 mg) by mouth once daily after 5 pm. Take while neutropenic as directed by oncologist. 11/20/21   [provider]  atorvastatin (LIPITOR) 20 MG tablet Take 20 mg by mouth daily. 04/01/21   [provider]  benzonatate (TESSALON) 100 MG capsule Take 1 capsule by mouth 3 (three) times daily as needed. 07/24/21   [provider]  cinacalcet (SENSIPAR) 30 MG tablet Take 30 mg by mouth daily.    [provider]  cyanocobalamin 1000 MCG tablet Take by mouth. 06/19/21   [provider]  dapsone 100 MG tablet Take 100 mg by mouth daily.     [provider]  dexamethasone (DECADRON) 2 MG tablet Take 3 tablets (6mg ) on 02/20/22 and 1 tablet (2mg ) on 02/21/22. 02/19/22   [provider]  diclofenac Sodium (VOLTAREN) 1 % GEL Apply topically.    [provider]  docusate sodium (COLACE) 100 MG capsule Take 1 capsule (100 mg total) by mouth 2 (two) times daily as needed for mild constipation. Patient not taking: Reported on 06/03/2021 03/22/21   Lorin Glass, MD  ENTRESTO 24-26 MG Take by mouth. 09/08/21   [provider]  gabapentin (NEURONTIN) 300 MG capsule Take 300 mg by mouth at bedtime. 05/23/21   [provider]  labetalol (NORMODYNE) 5 MG/ML injection Inject 2 mLs (10 mg total) into the vein every 2 (two) hours as needed (SBP > 180 or DBP > 100; HR must be > 70 to administer). Patient not taking: Reported on 06/03/2021 03/22/21   Lorin Glass, MD  lidocaine, PF, (XYLOCAINE) 1 % SOLN injection Inject 5 mLs into the skin as needed (topical anesthesia for hemodialysis if GEBAUERS is ineffective.). Patient not taking: Reported on 06/03/2021 03/22/21   Lorin Glass, MD  lidocaine-prilocaine (EMLA) cream Apply 1 application topically as needed (topical anesthesia for hemodialysis if Gebauers and Lidocaine injection are ineffective.). Patient not taking: Reported on 06/03/2021 03/22/21   Lorin Glass, MD  magic mouthwash w/lidocaine SOLN Take 5 mLs by mouth 3 (three) times daily as needed for mouth pain. Patient not taking: Reported  on 06/03/2021 03/22/21   Lorin Glass, MD  Mouthwashes (MOUTH RINSE) LIQD solution 15 mLs by Mouth Rinse route 2 (two) times daily. Patient not taking: Reported on 06/03/2021 03/22/21   Lorin Glass, MD  ondansetron [facility]) 4 MG/2ML SOLN injection Inject 2 mLs (4 mg total) into the vein every 8 (eight) hours as needed for nausea or vomiting. Patient not taking: Reported on 06/03/2021 03/22/21   Lorin Glass, MD  pantoprazole (PROTONIX) 40 MG injection Inject 40 mg  into the vein daily. Patient not taking: Reported on 06/03/2021 03/23/21   Lorin Glass, MD  pentafluoroprop-tetrafluoroeth Peggye Pitt) AERO Apply 1 application topically as needed (topical anesthesia for hemodialysis). Patient not taking: Reported on 06/03/2021 03/22/21   Lorin Glass, MD  polyethylene glycol (MIRALAX / GLYCOLAX) 17 g packet Take 17 g by mouth daily as needed for moderate constipation. Patient not taking: Reported on 06/03/2021 03/22/21   Lorin Glass, MD  sevelamer carbonate (RENVELA) 800 MG tablet Take by mouth.    [provider]  Tbo-Filgrastim (GRANIX) 480 MCG/0.8ML SOSY injection Inject 0.8 mLs (480 mcg total) into the skin daily at 6 (six) AM. Patient not taking: Reported on 06/03/2021 03/22/21   Lorin Glass, MD  traMADol (ULTRAM) 50 MG tablet Take 50 mg by mouth 3 (three) times daily as needed.    [provider]      Allergies    Other, Amlodipine, and Methotrexate    Review of Systems   Review of Systems  Respiratory:  Positive for shortness of breath.   Neurological:  Positive for weakness.  All other systems reviewed and are negative.   Physical Exam Updated Vital Signs Ht 6\' 1"  (1.854 m)   Wt (!) 145 kg   BMI 42.17 kg/m   Physical Exam Vitals and nursing note reviewed.  Constitutional:      Appearance: She is well-developed. She is obese.     Comments: Awake, alert, emesis present on shirt  HENT:     Head: Normocephalic and atraumatic.  Eyes:     Conjunctiva/sclera: Conjunctivae normal.     Pupils: Pupils are equal, round, and reactive to light.  Cardiovascular:     Rate and Rhythm: Normal rate and regular rhythm.     Heart sounds: Normal heart sounds.  Pulmonary:     Effort: Pulmonary effort is normal.     Breath sounds: Normal breath sounds.  Abdominal:     General: Bowel sounds are normal.     Palpations: Abdomen is soft.  Musculoskeletal:        General: Normal range of motion.     Cervical back: Normal range of  motion.  Skin:    General: Skin is warm and dry.  Neurological:     Mental Status: She is alert and oriented to person, place, and time.     Comments: AAOx3, left sided weakness     ED Results / Procedures / Treatments   Labs (all labs ordered are listed, but only abnormal results are displayed) Labs Reviewed  CBC WITH DIFFERENTIAL/PLATELET - Abnormal; Notable for the following components:      Result Value   RBC 2.78 (*)    Hemoglobin 9.5 (*)    HCT 31.0 (*)    MCV 111.5 (*)    MCH 34.2 (*)    RDW 16.8 (*)    All other components within normal limits  COMPREHENSIVE METABOLIC PANEL - Abnormal; Notable for the following components:   CO2  20 (*)    BUN 57 (*)    Creatinine, Ser 14.39 (*)    Calcium 8.8 (*)    Total Protein 4.7 (*)    Albumin 3.0 (*)    GFR, Estimated 3 (*)    All other components within normal limits  LACTIC ACID, PLASMA - Abnormal; Notable for the following components:   Lactic Acid, Venous 2.9 (*)    All other components within normal limits  PHOSPHORUS - Abnormal; Notable for the following components:   Phosphorus 4.8 (*)    All other components within normal limits  URIC ACID - Abnormal; Notable for the following components:   Uric Acid, Serum 9.9 (*)    All other components within normal limits  LACTATE DEHYDROGENASE - Abnormal; Notable for the following components:   LDH 303 (*)    All other components within normal limits  I-STAT CHEM 8, ED - Abnormal; Notable for the following components:   BUN 54 (*)    Creatinine, Ser 15.50 (*)    Hemoglobin 10.2 (*)    HCT 30.0 (*)    All other components within normal limits  CULTURE, BLOOD (ROUTINE X 2)  CULTURE, BLOOD (ROUTINE X 2)  ETHANOL  MAGNESIUM  RAPID URINE DRUG SCREEN, HOSP PERFORMED  LACTIC ACID, PLASMA    EKG EKG Interpretation  Date/Time:  Monday Aug 10 2022 22:49:04 EDT Ventricular Rate:  126 PR Interval:  145 QRS Duration: 114 QT Interval:  310 QTC Calculation: 449 R  Axis:   -54 Text Interpretation: Sinus tachycardia Incomplete left bundle branch block Low voltage, precordial leads Probable left ventricular hypertrophy Confirmed by Vonita Moss [phone]) on 08/10/2022 10:53:07 PM  Radiology CT HEAD WO CONTRAST ( )  Result Date: 08/10/2022 CLINICAL DATA:  Left-sided weakness. EXAM: CT HEAD WITHOUT CONTRAST TECHNIQUE: Contiguous axial images were obtained from the base of the skull through the vertex without intravenous contrast. RADIATION DOSE REDUCTION: This exam was performed according to the departmental dose-optimization program which includes automated exposure control, adjustment of the mA and/or kV according to patient size and/or use of iterative reconstruction technique. COMPARISON:  None Available. FINDINGS: Brain: There is mild cerebral atrophy with widening of the extra-axial spaces and ventricular dilatation. There are areas of decreased attenuation within the white matter tracts of the supratentorial brain, consistent with microvascular disease changes. A ventriculostomy catheter is seen entering via a right frontal burr hole. Its distal tip is located within the medial aspect of the mid right lateral ventricle. Vascular: No hyperdense vessel or unexpected calcification. Skull: The skull is diffusely sclerotic with moderate severity diffuse hyperostosis. Sinuses/Orbits: No acute finding. Other: None. IMPRESSION: 1. No acute intracranial abnormality. 2. Ventriculostomy catheter positioning, as described above. 3. Generalized cerebral atrophy with widening of the extra-axial spaces and ventricular dilatation. Electronically Signed   By: Aram Candela M.D.   On: 08/10/2022 23:19   DG Chest Port 1 View  Result Date: 08/10/2022 CLINICAL DATA:  Stroke symptoms. Vomiting. Shortness of breath. EXAM: PORTABLE CHEST 1 VIEW COMPARISON:  Chest radiograph 08/06/2021 FINDINGS: Right chest port with tip in the right atrium. Left-sided dialysis catheter with tip  overlying the atrial caval junction. Lower lung volumes from prior exam. Cardiomegaly is similar. Bronchovascular crowding versus vascular congestion. No evidence of focal airspace disease, large pleural effusion or visible pneumothorax. IMPRESSION: Lower lung volumes from prior exam with bronchovascular crowding versus vascular congestion. Stable cardiomegaly. Electronically Signed   By: Narda Rutherford M.D.   On: 08/10/2022 23:04  Procedures Procedures  {Document cardiac monitor, telemetry assessment procedure when appropriate:1}  Medications Ordered in ED Medications - No data to display  ED Course/ Medical Decision Making/ A&P   {   Click here for ABCD2, HEART and other calculatorsREFRESH Note before signing :1}                          Medical Decision Making Amount and/or Complexity of Data Reviewed Labs: ordered. Radiology: ordered and independent interpretation performed. ECG/medicine tests: ordered and independent interpretation performed.  Risk Decision regarding hospitalization.   ***  {Document critical care time when appropriate:1} {Document review of labs and clinical decision tools ie heart score, Chads2Vasc2 etc:1}  {Document your independent review of radiology images, and any outside records:1} {Document your discussion with family members, caretakers, and with consultants:1} {Document social determinants of health affecting pt's care:1} {Document your decision making why or why not admission, treatments were needed:1} Final Clinical Impression(s) / ED Diagnoses Final diagnoses:  None    Rx / DC Orders ED Discharge Orders     None

## 2022-08-11 DIAGNOSIS — C9100 Acute lymphoblastic leukemia not having achieved remission: Secondary | ICD-10-CM | POA: Diagnosis not present

## 2022-08-11 LAB — LACTIC ACID, PLASMA: Lactic Acid, Venous: 1.7 mmol/L (ref 0.5–1.9)

## 2022-08-11 LAB — CULTURE, BLOOD (ROUTINE X 2)
Culture: NO GROWTH
Special Requests: ADEQUATE

## 2022-08-11 MED ORDER — ACETAMINOPHEN 325 MG PO TABS
650.0000 mg | ORAL_TABLET | Freq: Once | ORAL | Status: AC
  Administered 2022-08-11: 650 mg via ORAL
  Filled 2022-08-11: qty 2

## 2022-08-11 MED ORDER — TRAMADOL HCL 50 MG PO TABS
50.0000 mg | ORAL_TABLET | Freq: Once | ORAL | Status: AC
  Administered 2022-08-11: 50 mg via ORAL
  Filled 2022-08-11: qty 1

## 2022-08-11 NOTE — ED Notes (Signed)
[facility] ambulance crew at bedside for transfer.

## 2022-08-11 NOTE — ED Notes (Signed)
Report given to [facility]

## 2022-08-11 NOTE — Plan of Care (Signed)
Patient with known history of B-cell AL L undergoing treatment at [facility], brought in with complaints of generalized weakness, SVT, possible left face and arm weakness with last known well on Friday, also missed 3 dialysis.  Initial plan was to transfer to [facility] but beds are not available and she might be put on a wait list but in the interim requires dialysis here and may be admitted to [facility]. She is outside the window for any acute stroke treatment.  I recommended MRI of the brain without contrast and if that shows a stroke we will be happy to see her. Plan was discussed with Sharilyn Sites, PA-C  -- Milon Dikes, MD Neurologist [facility] Pager: [phone]

## 2022-08-11 NOTE — ED Notes (Signed)
[facility] transfer line called and updated vitals given.  Bed assigned: C621.  Number for report [phone].  Dispatch to call when transportation is arranged

## 2022-08-12 LAB — CULTURE, BLOOD (ROUTINE X 2)

## 2022-08-13 LAB — CULTURE, BLOOD (ROUTINE X 2)

## 2022-08-14 LAB — CULTURE, BLOOD (ROUTINE X 2): Special Requests: ADEQUATE

## 2022-08-15 LAB — CULTURE, BLOOD (ROUTINE X 2)

## 2022-08-16 LAB — CULTURE, BLOOD (ROUTINE X 2): Culture: NO GROWTH

## 2022-09-07 ENCOUNTER — Emergency Department (HOSPITAL_COMMUNITY): Payer: Medicare HMO

## 2022-09-07 ENCOUNTER — Encounter (HOSPITAL_COMMUNITY): Payer: Self-pay

## 2022-09-07 ENCOUNTER — Other Ambulatory Visit: Payer: Self-pay

## 2022-09-07 ENCOUNTER — Emergency Department (HOSPITAL_COMMUNITY)
Admission: EM | Admit: 2022-09-07 | Discharge: 2022-09-08 | Disposition: A | Discharge status: Home / Self Care | Payer: Medicare HMO | Attending: Emergency Medicine | Admitting: Emergency Medicine

## 2022-09-07 DIAGNOSIS — R29810 Facial weakness: Secondary | ICD-10-CM | POA: Insufficient documentation

## 2022-09-07 DIAGNOSIS — Z79899 Other long term (current) drug therapy: Secondary | ICD-10-CM | POA: Insufficient documentation

## 2022-09-07 DIAGNOSIS — Z992 Dependence on renal dialysis: Secondary | ICD-10-CM | POA: Insufficient documentation

## 2022-09-07 DIAGNOSIS — N186 End stage renal disease: Secondary | ICD-10-CM | POA: Insufficient documentation

## 2022-09-07 DIAGNOSIS — Z8541 Personal history of malignant neoplasm of cervix uteri: Secondary | ICD-10-CM | POA: Insufficient documentation

## 2022-09-07 DIAGNOSIS — R4781 Slurred speech: Secondary | ICD-10-CM | POA: Diagnosis not present

## 2022-09-07 DIAGNOSIS — I509 Heart failure, unspecified: Secondary | ICD-10-CM | POA: Diagnosis not present

## 2022-09-07 DIAGNOSIS — G9340 Encephalopathy, unspecified: Secondary | ICD-10-CM

## 2022-09-07 DIAGNOSIS — Z1152 Encounter for screening for COVID-19: Secondary | ICD-10-CM | POA: Insufficient documentation

## 2022-09-07 DIAGNOSIS — R4182 Altered mental status, unspecified: Secondary | ICD-10-CM | POA: Diagnosis not present

## 2022-09-07 DIAGNOSIS — R410 Disorientation, unspecified: Secondary | ICD-10-CM | POA: Diagnosis present

## 2022-09-07 DIAGNOSIS — I132 Hypertensive heart and chronic kidney disease with heart failure and with stage 5 chronic kidney disease, or end stage renal disease: Secondary | ICD-10-CM | POA: Diagnosis not present

## 2022-09-07 LAB — CBC
HCT: 30.8 % — ABNORMAL LOW (ref 36.0–46.0)
Hemoglobin: 9.5 g/dL — ABNORMAL LOW (ref 12.0–15.0)
MCH: 32.9 pg (ref 26.0–34.0)
MCHC: 30.8 g/dL (ref 30.0–36.0)
MCV: 106.6 fL — ABNORMAL HIGH (ref 80.0–100.0)
Platelets: 189 10*3/uL (ref 150–400)
RBC: 2.89 MIL/uL — ABNORMAL LOW (ref 3.87–5.11)
RDW: 13.6 % (ref 11.5–15.5)
WBC: 5.8 10*3/uL (ref 4.0–10.5)
nRBC: 0 % (ref 0.0–0.2)

## 2022-09-07 LAB — LACTIC ACID, PLASMA
Lactic Acid, Venous: 1.4 mmol/L (ref 0.5–1.9)
Lactic Acid, Venous: 1.5 mmol/L (ref 0.5–1.9)

## 2022-09-07 LAB — URINALYSIS, COMPLETE (UACMP) WITH MICROSCOPIC
Bilirubin Urine: NEGATIVE
Glucose, UA: 50 mg/dL — AB
Hgb urine dipstick: NEGATIVE
Ketones, ur: NEGATIVE mg/dL
Nitrite: NEGATIVE
Protein, ur: 100 mg/dL — AB
Specific Gravity, Urine: 1.016 (ref 1.005–1.030)
pH: 9 — ABNORMAL HIGH (ref 5.0–8.0)

## 2022-09-07 LAB — DIFFERENTIAL
Abs Immature Granulocytes: 0.03 10*3/uL (ref 0.00–0.07)
Basophils Absolute: 0 10*3/uL (ref 0.0–0.1)
Basophils Relative: 0 %
Eosinophils Absolute: 0 10*3/uL (ref 0.0–0.5)
Eosinophils Relative: 1 %
Immature Granulocytes: 1 %
Lymphocytes Relative: 10 %
Lymphs Abs: 0.6 10*3/uL — ABNORMAL LOW (ref 0.7–4.0)
Monocytes Absolute: 1 10*3/uL (ref 0.1–1.0)
Monocytes Relative: 17 %
Neutro Abs: 4.1 10*3/uL (ref 1.7–7.7)
Neutrophils Relative %: 71 %

## 2022-09-07 LAB — I-STAT VENOUS BLOOD GAS, ED
Acid-Base Excess: 8 mmol/L — ABNORMAL HIGH (ref 0.0–2.0)
Bicarbonate: 30.2 mmol/L — ABNORMAL HIGH (ref 20.0–28.0)
Calcium, Ion: 0.96 mmol/L — ABNORMAL LOW (ref 1.15–1.40)
HCT: 28 % — ABNORMAL LOW (ref 36.0–46.0)
Hemoglobin: 9.5 g/dL — ABNORMAL LOW (ref 12.0–15.0)
O2 Saturation: 65 %
Potassium: 3.3 mmol/L — ABNORMAL LOW (ref 3.5–5.1)
Sodium: 138 mmol/L (ref 135–145)
TCO2: 31 mmol/L (ref 22–32)
pCO2, Ven: 33.9 mmHg — ABNORMAL LOW (ref 44–60)
pH, Ven: 7.558 — ABNORMAL HIGH (ref 7.25–7.43)
pO2, Ven: 29 mmHg — CL (ref 32–45)

## 2022-09-07 LAB — I-STAT CHEM 8, ED
BUN: 17 mg/dL (ref 8–23)
Calcium, Ion: 0.97 mmol/L — ABNORMAL LOW (ref 1.15–1.40)
Chloride: 97 mmol/L — ABNORMAL LOW (ref 98–111)
Creatinine, Ser: 5.7 mg/dL — ABNORMAL HIGH (ref 0.44–1.00)
Glucose, Bld: 85 mg/dL (ref 70–99)
HCT: 30 % — ABNORMAL LOW (ref 36.0–46.0)
Hemoglobin: 10.2 g/dL — ABNORMAL LOW (ref 12.0–15.0)
Potassium: 3.1 mmol/L — ABNORMAL LOW (ref 3.5–5.1)
Sodium: 139 mmol/L (ref 135–145)
TCO2: 29 mmol/L (ref 22–32)

## 2022-09-07 LAB — COMPREHENSIVE METABOLIC PANEL
ALT: 8 U/L (ref 0–44)
AST: 10 U/L — ABNORMAL LOW (ref 15–41)
Albumin: 3.1 g/dL — ABNORMAL LOW (ref 3.5–5.0)
Alkaline Phosphatase: 111 U/L (ref 38–126)
Anion gap: 13 (ref 5–15)
BUN: 17 mg/dL (ref 8–23)
CO2: 27 mmol/L (ref 22–32)
Calcium: 8.1 mg/dL — ABNORMAL LOW (ref 8.9–10.3)
Chloride: 97 mmol/L — ABNORMAL LOW (ref 98–111)
Creatinine, Ser: 5.85 mg/dL — ABNORMAL HIGH (ref 0.44–1.00)
GFR, Estimated: 8 mL/min — ABNORMAL LOW (ref >60)
Glucose, Bld: 86 mg/dL (ref 70–99)
Potassium: 3 mmol/L — ABNORMAL LOW (ref 3.5–5.1)
Sodium: 137 mmol/L (ref 135–145)
Total Bilirubin: 1 mg/dL (ref 0.3–1.2)
Total Protein: 5.2 g/dL — ABNORMAL LOW (ref 6.5–8.1)

## 2022-09-07 LAB — CBG MONITORING, ED: Glucose-Capillary: 84 mg/dL (ref 70–99)

## 2022-09-07 LAB — OSMOLALITY: Osmolality: 298 mOsm/kg — ABNORMAL HIGH (ref 275–295)

## 2022-09-07 LAB — RESP PANEL BY RT-PCR (RSV, FLU A&B, COVID)  RVPGX2
Influenza A by PCR: NEGATIVE
Influenza B by PCR: NEGATIVE
Resp Syncytial Virus by PCR: NEGATIVE
SARS Coronavirus 2 by RT PCR: NEGATIVE

## 2022-09-07 LAB — PROTIME-INR
INR: 1.1 (ref 0.8–1.2)
Prothrombin Time: 14.5 seconds (ref 11.4–15.2)

## 2022-09-07 LAB — MAGNESIUM: Magnesium: 1.6 mg/dL — ABNORMAL LOW (ref 1.7–2.4)

## 2022-09-07 LAB — ETHANOL: Alcohol, Ethyl (B): <10 mg/dL (ref <10)

## 2022-09-07 LAB — PHOSPHORUS: Phosphorus: 3.1 mg/dL (ref 2.5–4.6)

## 2022-09-07 LAB — APTT: aPTT: 32 seconds (ref 24–36)

## 2022-09-07 LAB — AMMONIA: Ammonia: 13 umol/L (ref 9–35)

## 2022-09-07 LAB — TSH: TSH: 1.413 u[IU]/mL (ref 0.350–4.500)

## 2022-09-07 MED ORDER — IOHEXOL 350 MG/ML SOLN
50.0000 mL | Freq: Once | INTRAVENOUS | Status: AC | PRN
  Administered 2022-09-07: 50 mL via INTRAVENOUS

## 2022-09-07 MED ORDER — METRONIDAZOLE 500 MG/100ML IV SOLN
500.0000 mg | Freq: Once | INTRAVENOUS | Status: AC
  Administered 2022-09-07: 500 mg via INTRAVENOUS
  Filled 2022-09-07: qty 100

## 2022-09-07 MED ORDER — SODIUM CHLORIDE 0.9 % IV SOLN
2.0000 g | Freq: Once | INTRAVENOUS | Status: AC
  Administered 2022-09-07: 2 g via INTRAVENOUS
  Filled 2022-09-07: qty 12.5

## 2022-09-07 MED ORDER — SODIUM CHLORIDE 0.9% FLUSH
3.0000 mL | Freq: Once | INTRAVENOUS | Status: AC
  Administered 2022-09-07: 3 mL via INTRAVENOUS

## 2022-09-07 MED ORDER — IOHEXOL 350 MG/ML SOLN
100.0000 mL | Freq: Once | INTRAVENOUS | Status: AC | PRN
  Administered 2022-09-07: 100 mL via INTRAVENOUS

## 2022-09-07 MED ORDER — VANCOMYCIN VARIABLE DOSE PER UNSTABLE RENAL FUNCTION (PHARMACIST DOSING): Status: DC

## 2022-09-07 MED ORDER — DIAZEPAM 5 MG/ML IJ SOLN
5.0000 mg | Freq: Once | INTRAMUSCULAR | Status: AC
  Administered 2022-09-07: 5 mg via INTRAVENOUS
  Filled 2022-09-07: qty 2

## 2022-09-07 MED ORDER — SODIUM CHLORIDE 0.9 % IV SOLN
1.0000 g | INTRAVENOUS | Status: DC
  Filled 2022-09-07: qty 10

## 2022-09-07 MED ORDER — VANCOMYCIN HCL 2000 MG/400ML IV SOLN
2000.0000 mg | Freq: Once | INTRAVENOUS | Status: AC
  Administered 2022-09-07: 2000 mg via INTRAVENOUS
  Filled 2022-09-07: qty 400

## 2022-09-07 MED ORDER — VANCOMYCIN HCL IN DEXTROSE 1-5 GM/200ML-% IV SOLN: 1000.0000 mg | Freq: Once | INTRAVENOUS | Status: DC

## 2022-09-07 NOTE — Progress Notes (Signed)
RT attempted ABG x2 per order. RT unable to obtain, pt in no respiratory distress saturating 100% on 2L.

## 2022-09-07 NOTE — Code Documentation (Signed)
Stroke Response Nurse Documentation Code Documentation  Tracey Stevens is a 62 y.o. female arriving to [facility]  via [facility] on 09/07/2022 with past medical hx of ESRD, CHF, HTN. On No antithrombotic. Code stroke was activated by EMS.   Patient from hemodialysis center where she was LKW at 1120 and now complaining of left facial weakness and slurred speech.    Stroke team at the bedside on patient arrival. Labs drawn and patient cleared for CT by Dr. Dalene Seltzer. Patient to CT with team. NIHSS 18, see documentation for details and code stroke times.  Patient with disoriented, not following commands, right hemianopia, left facial droop, right arm weakness, bilateral leg weakness, Global aphasia , and dysarthria  on exam.   The following imaging was completed:  CT Head, CTA, and CTP.   Patient is not a candidate for IV Thrombolytic due to LKW 1120. Patient is not a candidate for IR due to imaging negative for LVO.   Care Plan: q2h NIHSS and VS.   Bedside handoff with ED RN Chloe.    Tracey Stevens Tracey Stevens  Rapid Response RN

## 2022-09-07 NOTE — Sepsis Progress Note (Signed)
Elink monitoring for the code sepsis protocol.  

## 2022-09-07 NOTE — Progress Notes (Signed)
Pharmacy Antibiotic Note  Tracey Stevens is a 62 y.o. female for which pharmacy has been consulted for cefepime and vancomycin dosing for sepsis.  Patient with a history of ALL, ESRD on HD, HF, HTN, Ommya reservoir, IT chemo administration. Patient presenting as a code stroke from HD center today secondary to AMS. HD session not completed per EMS report.  WBC 5.8; LA pending; T 97.9; HR 101; RR 18  Plan: Vancomycin 2g given in the ED -- repeat dosing per dialysis plan, pharmacy to follow Metronidazole per MD Cefepime 2g given in ED will continue with Cefepime 1g q24hr  Monitor WBC, fever, renal function, cultures De-escalate when able F/u Nephrology plan  Weight: 127.4 kg (280 lb 13.9 oz)  Temp (24hrs), Avg:97.9 F (36.6 C), Min:97.9 F (36.6 C), Max:97.9 F (36.6 C)  Recent Labs  Lab 09/07/22 1517 09/07/22 1720  WBC 5.8  --   CREATININE  --  5.70*    Estimated Creatinine Clearance: 15.3 mL/min (A) (by C-G formula based on SCr of 5.7 mg/dL (H)).    Allergies  Allergen Reactions   Other Other (See Comments)    Transparent dressing - skin tear   Amlodipine Swelling    Peripheral edema, ankles swell    Methotrexate Dermatitis   Microbiology results: Pending  Thank you for allowing pharmacy to be a part of this patient's care.  Delmar Landau, PharmD, BCPS 09/07/2022 5:51 PM ED Clinical Pharmacist -  [phone]

## 2022-09-07 NOTE — Consult Note (Signed)
Neurology Consultation  Reason for Consult: Code Stroke Referring Physician: Criss Alvine  CC: confusion  History is obtained from: Chart Review, EMS  HPI: Tracey Stevens is a 62 y.o. female with a past medical history of ALL, ESRD, CHF, HTN, Ommya reservoir to reduce the need for LP and IT chemo administration presenting with confusion and weakness.  She was apparently relatively normal when she went to dialysis, and they reported that she was connected around 11:20 AM.  At some point during dialysis, she began to feel off, but it is unclear exactly when.  She has to be disconnected about 40 minutes prior to the end of her course, and she was assisted outside.  She continued sitting on the porch outside the facility, and it was at the time of reassessment that it was noted that she was slurring her speech more than typical and therefore EMS was called.  EMS reports that she is in a wheelchair at baseline   LKW: 11:20 AM TNK given?: No, outside window Premorbid modified Rankin scale (mRS): 4 0-Completely asymptomatic and back to baseline post-stroke 1-No significant post stroke disability and can perform usual duties with stroke symptoms 2-Slight disability-UNABLE to perform all activities but does not need assistance  3-Moderate disability-requires help but walks WITHOUT assistance 4-Needs assistance to walk and tend to bodily needs 5-Severe disability-bedridden, incontinent, needs constant attention 6- Death   ROS:  Unable to obtain due to altered mental status.   Past Medical History:  Diagnosis Date   Acute lymphoblastic leukemia (ALL) (HCC)    Adrenal nodule (HCC)    Anemia    Cancer (HCC)    cervical   Congestive heart failure (CHF) (HCC)    Diffuse Large B Cell Lymphoma 2015 remission   Dyspnea    SOB when she over exerts herself   ESRD on dialysis [facility])    M-W-F   Hearing loss    History of blood transfusion    Hyperparathyroidism (HCC)    s/p parathyroidectomy    Hypertension    Hypertension    Multinodular goiter    Multiple renal cysts    Nephrolithiasis    On antineoplastic chemotherapy    Pre-diabetes    Most recent A1C 6.7- 09/13/20. No blood sugar checks   Vertigo    Vitamin D deficiency      Family History  Problem Relation Age of Onset   Heart disease Mother    Hypertension Mother    Gastric cancer Father    Renal Disease Father    Cancer Maternal Grandmother    Leukemia Maternal Grandfather    Cancer Paternal Grandmother    Cancer Paternal Grandfather     Social History:   reports that she has never smoked. She has never used smokeless tobacco. She reports current alcohol use. She reports that she does not use drugs.  Medications  Current Facility-Administered Medications:    sodium chloride flush (NS) 0.9 % injection 3 mL, 3 mL, Intravenous, Once, Pricilla Loveless, MD  Current Outpatient Medications:    acyclovir (ZOVIRAX) 400 MG tablet, Take 1 tablet (400 mg) by mouth once daily after 5 pm. Take while neutropenic as directed by oncologist., Disp: , Rfl:    atorvastatin (LIPITOR) 20 MG tablet, Take 20 mg by mouth daily., Disp: , Rfl:    benzonatate (TESSALON) 100 MG capsule, Take 1 capsule by mouth 3 (three) times daily as needed., Disp: , Rfl:    cinacalcet (SENSIPAR) 30 MG tablet, Take 30 mg by mouth  daily., Disp: , Rfl:    cyanocobalamin 1000 MCG tablet, Take by mouth., Disp: , Rfl:    dapsone 100 MG tablet, Take 100 mg by mouth daily., Disp: , Rfl:    dexamethasone (DECADRON) 2 MG tablet, Take 3 tablets (6mg ) on 02/20/22 and 1 tablet (2mg ) on 02/21/22., Disp: , Rfl:    diclofenac Sodium (VOLTAREN) 1 % GEL, Apply topically., Disp: , Rfl:    docusate sodium (COLACE) 100 MG capsule, Take 1 capsule (100 mg total) by mouth 2 (two) times daily as needed for mild constipation. (Patient not taking: Reported on 06/03/2021), Disp: 10 capsule, Rfl: 0   ENTRESTO 24-26 MG, Take by mouth., Disp: , Rfl:    gabapentin (NEURONTIN) 300 MG  capsule, Take 300 mg by mouth at bedtime., Disp: , Rfl:    labetalol (NORMODYNE) 5 MG/ML injection, Inject 2 mLs (10 mg total) into the vein every 2 (two) hours as needed (SBP > 180 or DBP > 100; HR must be > 70 to administer). (Patient not taking: Reported on 06/03/2021), Disp: 20 mL, Rfl:    lidocaine, PF, (XYLOCAINE) 1 % SOLN injection, Inject 5 mLs into the skin as needed (topical anesthesia for hemodialysis if GEBAUERS is ineffective.). (Patient not taking: Reported on 06/03/2021), Disp: 1 mL, Rfl: 0   lidocaine-prilocaine (EMLA) cream, Apply 1 application topically as needed (topical anesthesia for hemodialysis if Gebauers and Lidocaine injection are ineffective.). (Patient not taking: Reported on 06/03/2021), Disp: 30 g, Rfl: 0   magic mouthwash w/lidocaine SOLN, Take 5 mLs by mouth 3 (three) times daily as needed for mouth pain. (Patient not taking: Reported on 06/03/2021), Disp: , Rfl: 0   Mouthwashes (MOUTH RINSE) LIQD solution, 15 mLs by Mouth Rinse route 2 (two) times daily. (Patient not taking: Reported on 06/03/2021), Disp: , Rfl: 0   ondansetron (ZOFRAN) 4 MG/2ML SOLN injection, Inject 2 mLs (4 mg total) into the vein every 8 (eight) hours as needed for nausea or vomiting. (Patient not taking: Reported on 06/03/2021), Disp: 2 mL, Rfl: 0   pantoprazole (PROTONIX) 40 MG injection, Inject 40 mg into the vein daily. (Patient not taking: Reported on 06/03/2021), Disp: 1 each, Rfl:    pentafluoroprop-tetrafluoroeth (GEBAUERS) AERO, Apply 1 application topically as needed (topical anesthesia for hemodialysis). (Patient not taking: Reported on 06/03/2021), Disp: , Rfl: 0   polyethylene glycol (MIRALAX / GLYCOLAX) 17 g packet, Take 17 g by mouth daily as needed for moderate constipation. (Patient not taking: Reported on 06/03/2021), Disp: 14 each, Rfl: 0   sevelamer carbonate (RENVELA) 800 MG tablet, Take by mouth., Disp: , Rfl:    Tbo-Filgrastim (GRANIX) 480 MCG/0.8ML SOSY injection, Inject 0.8 mLs (480 mcg  total) into the skin daily at 6 (six) AM. (Patient not taking: Reported on 06/03/2021), Disp: 1 mL, Rfl: 0   traMADol (ULTRAM) 50 MG tablet, Take 50 mg by mouth 3 (three) times daily as needed., Disp: , Rfl:    Exam: Current vital signs: Wt 127.4 kg   BMI 37.06 kg/m  Vital signs in last 24 hours: Weight:  [127.4 kg] 127.4 kg (06/24 1719)  GENERAL: Awake, alert in NAD HEENT: - Normocephalic and atraumatic, dry mm, no LN++, no Thyromegally LUNGS - Clear to auscultation bilaterally with no wheezes CV - S1S2 RRR, no m/r/g, equal pulses bilaterally. ABDOMEN - Soft, nontender, nondistended with normoactive BS Ext: warm, well perfused, intact peripheral pulses, no edema  NEURO:  Mental Status: Oriented to name, states she is 62 years old and that the  month is April Language: Speech is dysarthric, she has increased latency of response and Cranial Nerves: PERRL. EOMI, visual fields are very difficult to assess given that she has an unreliable blink to threat, but does fixate and track at times, and is able to identify simple objects.  She does not reliably count in any visual field,  slight left facial droop, hearing intact, tongue/uvula/soft palate midline, normal sternocleidomastoid and trapezius muscle strength. No evidence of tongue atrophy or fasciculations Motor:  RUE 4/5 LUE 4/5 RLE  2/5 LLE  2/5 Sensation-she response to noxious stimulation bilaterally Coordination: She does not perform, when attempting to check drift she clearly has postural tremor bilaterally which is apparently normal for her after dialysis.  NIHSS 1a Level of Conscious.: 0 1b LOC Questions: 2 1c LOC Commands: 0 2 Best Gaze: 0 3 Visual: 2(I had the impression that she might have a right field deficit, though this is very difficult given inconsistent answers and difficulty checking blink to threat) 4 Facial Palsy: 1 5a Motor Arm - left: 1 5b Motor Arm - Right: 2 6a Motor Leg - Left: 3 6b Motor Leg - Right: 3 7  Limb Ataxia: 0 8 Sensory: 0 9 Best Language: 1 10 Dysarthria: 1 11 Extinct. and Inatten.: 0 TOTAL: 16   Labs I have reviewed labs in epic and the results pertinent to this consultation are:   CBC    Component Value Date/Time   WBC 6.6 08/10/2022 2249   RBC 2.78 (L) 08/10/2022 2249   HGB 10.2 (L) 08/10/2022 2253   HGB 8.4 (L) 09/11/2020 1609   HGB 13.1 01/05/2017 1047   HCT 30.0 (L) 08/10/2022 2253   HCT 38.9 01/05/2017 1047   PLT 174 08/10/2022 2249   PLT 218 09/11/2020 1609   PLT 279 01/05/2017 1047   MCV 111.5 (H) 08/10/2022 2249   MCV 91.1 01/05/2017 1047   MCH 34.2 (H) 08/10/2022 2249   MCHC 30.6 08/10/2022 2249   RDW 16.8 (H) 08/10/2022 2249   RDW 13.3 01/05/2017 1047   LYMPHSABS 0.4 (L) 08/10/2022 2249   LYMPHSABS 1.7 01/05/2017 1047   MONOABS 0.9 08/10/2022 2249   MONOABS 0.4 01/05/2017 1047   EOSABS 0.0 08/10/2022 2249   EOSABS 0.2 01/05/2017 1047   BASOSABS 0.0 08/10/2022 2249   BASOSABS 0.0 01/05/2017 1047    CMP     Component Value Date/Time   NA 143 08/10/2022 2253   NA 144 01/05/2017 1047   K 4.6 08/10/2022 2253   K 4.1 01/05/2017 1047   CL 109 08/10/2022 2253   CO2 20 (L) 08/10/2022 2249   CO2 29 01/05/2017 1047   GLUCOSE 94 08/10/2022 2253   GLUCOSE 93 01/05/2017 1047   BUN 54 (H) 08/10/2022 2253   BUN 14.3 01/05/2017 1047   CREATININE 15.50 (H) 08/10/2022 2253   CREATININE 3.32 (HH) 09/11/2020 1609   CREATININE 1.1 01/05/2017 1047   CALCIUM 8.8 (L) 08/10/2022 2249   CALCIUM 10.1 01/05/2017 1047   PROT 4.7 (L) 08/10/2022 2249   PROT 7.1 01/05/2017 1047   ALBUMIN 3.0 (L) 08/10/2022 2249   ALBUMIN 3.8 01/05/2017 1047   AST 18 08/10/2022 2249   AST 14 (L) 09/11/2020 1609   AST 17 01/05/2017 1047   ALT 7 08/10/2022 2249   ALT <6 09/11/2020 1609   ALT 9 01/05/2017 1047   ALKPHOS 121 08/10/2022 2249   ALKPHOS 66 01/05/2017 1047   BILITOT 0.8 08/10/2022 2249   BILITOT 0.3 09/11/2020 1609   BILITOT  0.62 01/05/2017 1047   GFRNONAA 3 (L)  08/10/2022 2249   GFRNONAA 15 (L) 09/11/2020 1609   GFRAA 30 (L) 01/10/2019 1049    Lipid Panel  No results found for: "CHOL", "TRIG", "HDL", "CHOLHDL", "VLDL", "LDLCALC", "LDLDIRECT"   Imaging   CT-head-no acute findings  CT Angio Head and Neck-no acute findings    Assessment:  62 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  Certainly with her ongoing chemotherapy, and history of neutropenic fever, sepsis would be a consideration.  Stroke could be a consideration as well, and she will need MRI.  CNS toxicity from chemotherapy would not be expected to come on this quickly.  Also, would expect a slower presentation with CNS malignancy.  Recommendations: 1) MRI brain with and without contrast 2) EEG 3) ammonia, TSH 4) if no clear cause, may need to consider CSF sampling, including cytology. 5) neurology will continue to follow  Ritta Slot, MD [facility] [phone]  If 7pm- 7am, please page neurology on call as listed in AMION.

## 2022-09-07 NOTE — ED Notes (Signed)
Patient transported to CT scan . 

## 2022-09-07 NOTE — ED Triage Notes (Signed)
Pt BIB EMS due to a code stroke. LSN 1545. Sx started at 1630. Pt was at dialysis and stopped tx 45 min early. Pt having left sided weakness and left sided facial droop. Pt usually axox4 and wheelchair bound.

## 2022-09-07 NOTE — ED Provider Notes (Signed)
[place] EMERGENCY DEPARTMENT AT [facility] Provider Note   CSN: [id number] Arrival date & time: 09/07/22  1711  An emergency department physician performed an initial assessment on this suspected stroke patient at 1714.  History  Chief Complaint  Patient presents with   Code Stroke    Tracey Stevens is a 62 y.o. female.  HPI 62 year old female presents from dialysis with altered mental status as a code stroke.  History is minimal from the patient.  EMS reports that dialysis called and reported that she was last known well at 3:45 PM.  Did not get her full treatment.  Seems to have a left-sided facial droop and weakness diffusely.  Home Medications Prior to Admission medications   Medication Sig Start Date End Date Taking? Authorizing Provider  acyclovir (ZOVIRAX) 400 MG tablet Take 1 tablet (400 mg) by mouth once daily after 5 pm. Take while neutropenic as directed by oncologist. 11/20/21   [provider]  atorvastatin (LIPITOR) 20 MG tablet Take 20 mg by mouth daily. 04/01/21   [provider]  benzonatate (TESSALON) 100 MG capsule Take 1 capsule by mouth 3 (three) times daily as needed. 07/24/21   [provider]  cinacalcet (SENSIPAR) 30 MG tablet Take 30 mg by mouth daily.    [provider]  cyanocobalamin 1000 MCG tablet Take by mouth. 06/19/21   [provider]  dapsone 100 MG tablet Take 100 mg by mouth daily.    [provider]  dexamethasone (DECADRON) 2 MG tablet Take 3 tablets (6mg ) on 02/20/22 and 1 tablet (2mg ) on 02/21/22. 02/19/22   [provider]  diclofenac Sodium (VOLTAREN) 1 % GEL Apply topically.    [provider]  docusate sodium (COLACE) 100 MG capsule Take 1 capsule (100 mg total) by mouth 2 (two) times daily as needed for mild constipation. Patient not taking: Reported on 06/03/2021 03/22/21   Lorin Glass, MD  ENTRESTO 24-26 MG Take by mouth. 09/08/21   [provider]   gabapentin (NEURONTIN) 300 MG capsule Take 300 mg by mouth at bedtime. 05/23/21   [provider]  labetalol (NORMODYNE) 5 MG/ML injection Inject 2 mLs (10 mg total) into the vein every 2 (two) hours as needed (SBP > 180 or DBP > 100; HR must be > 70 to administer). Patient not taking: Reported on 06/03/2021 03/22/21   Lorin Glass, MD  lidocaine, PF, (XYLOCAINE) 1 % SOLN injection Inject 5 mLs into the skin as needed (topical anesthesia for hemodialysis if GEBAUERS is ineffective.). Patient not taking: Reported on 06/03/2021 03/22/21   Lorin Glass, MD  lidocaine-prilocaine (EMLA) cream Apply 1 application topically as needed (topical anesthesia for hemodialysis if Gebauers and Lidocaine injection are ineffective.). Patient not taking: Reported on 06/03/2021 03/22/21   Lorin Glass, MD  magic mouthwash w/lidocaine SOLN Take 5 mLs by mouth 3 (three) times daily as needed for mouth pain. Patient not taking: Reported on 06/03/2021 03/22/21   Lorin Glass, MD  Mouthwashes (MOUTH RINSE) LIQD solution 15 mLs by Mouth Rinse route 2 (two) times daily. Patient not taking: Reported on 06/03/2021 03/22/21   Lorin Glass, MD  ondansetron [facility]) 4 MG/2ML SOLN injection Inject 2 mLs (4 mg total) into the vein every 8 (eight) hours as needed for nausea or vomiting. Patient not taking: Reported on 06/03/2021 03/22/21   Lorin Glass, MD  pantoprazole (PROTONIX) 40 MG injection Inject 40 mg into the vein daily. Patient not taking:  Reported on 06/03/2021 03/23/21   Lorin Glass, MD  pentafluoroprop-tetrafluoroeth Peggye Pitt) AERO Apply 1 application topically as needed (topical anesthesia for hemodialysis). Patient not taking: Reported on 06/03/2021 03/22/21   Lorin Glass, MD  polyethylene glycol (MIRALAX / GLYCOLAX) 17 g packet Take 17 g by mouth daily as needed for moderate constipation. Patient not taking: Reported on 06/03/2021 03/22/21   Lorin Glass, MD  sevelamer carbonate (RENVELA) 800 MG tablet  Take by mouth.    [provider]  Tbo-Filgrastim (GRANIX) 480 MCG/0.8ML SOSY injection Inject 0.8 mLs (480 mcg total) into the skin daily at 6 (six) AM. Patient not taking: Reported on 06/03/2021 03/22/21   Lorin Glass, MD  traMADol (ULTRAM) 50 MG tablet Take 50 mg by mouth 3 (three) times daily as needed.    [provider]      Allergies    Other, Amlodipine, and Methotrexate    Review of Systems   Review of Systems  Unable to perform ROS: Mental status change    Physical Exam Updated Vital Signs BP (!) 171/108   Pulse (!) 109   Temp 97.9 F (36.6 C) (Rectal)   Resp (!) 29   Wt 127.4 kg   SpO2 97%   BMI 37.06 kg/m  Physical Exam Vitals and nursing note reviewed.  Constitutional:      Appearance: She is well-developed. She is obese.  HENT:     Head: Normocephalic and atraumatic.  Eyes:     Pupils: Pupils are equal, round, and reactive to light.  Cardiovascular:     Rate and Rhythm: Regular rhythm. Tachycardia present.     Pulses:          Dorsalis pedis pulses are 2+ on the right side.     Heart sounds: Normal heart sounds.  Pulmonary:     Effort: Pulmonary effort is normal.     Breath sounds: Normal breath sounds.  Abdominal:     General: There is no distension.     Palpations: Abdomen is soft.     Tenderness: There is no abdominal tenderness.  Musculoskeletal:     Cervical back: Normal range of motion. No rigidity.     Comments: Right foot is diffusely swollen, mild tenderness. Has increased warmth compared to left, but no overt cellulitis.  Skin:    General: Skin is warm and dry.  Neurological:     Mental Status: She is alert. She is disoriented.     Comments: Awake, alert, responds but mostly with sounds (sounds like yes). Equal strength in all 4 extremities but seems generally weak. After CT I don't particularly notice a facial droop.     ED Results / Procedures / Treatments   Labs (all labs ordered are listed, but only abnormal  results are displayed) Labs Reviewed  CBC - Abnormal; Notable for the following components:      Result Value   RBC 2.89 (*)    Hemoglobin 9.5 (*)    HCT 30.8 (*)    MCV 106.6 (*)    All other components within normal limits  DIFFERENTIAL - Abnormal; Notable for the following components:   Lymphs Abs 0.6 (*)    All other components within normal limits  COMPREHENSIVE METABOLIC PANEL - Abnormal; Notable for the following components:   Potassium 3.0 (*)    Chloride 97 (*)    Creatinine, Ser 5.85 (*)    Calcium 8.1 (*)    Total Protein 5.2 (*)    Albumin  3.1 (*)    AST 10 (*)    GFR, Estimated 8 (*)    All other components within normal limits  I-STAT CHEM 8, ED - Abnormal; Notable for the following components:   Potassium 3.1 (*)    Chloride 97 (*)    Creatinine, Ser 5.70 (*)    Calcium, Ion 0.97 (*)    Hemoglobin 10.2 (*)    HCT 30.0 (*)    All other components within normal limits  RESP PANEL BY RT-PCR (RSV, FLU A&B, COVID)  RVPGX2  CULTURE, BLOOD (ROUTINE X 2)  CULTURE, BLOOD (ROUTINE X 2)  PROTIME-INR  APTT  ETHANOL  LACTIC ACID, PLASMA  LACTIC ACID, PLASMA  URINALYSIS, W/ REFLEX TO CULTURE (INFECTION SUSPECTED)  CBG MONITORING, ED  I-STAT ARTERIAL BLOOD GAS, ED  I-STAT VENOUS BLOOD GAS, ED    EKG EKG Interpretation  Date/Time:  Monday September 07 2022 17:43:55 EDT Ventricular Rate:  103 PR Interval:  182 QRS Duration: 122 QT Interval:  368 QTC Calculation: 482 R Axis:   -67 Text Interpretation: Sinus tachycardia Nonspecific IVCD with LAD LVH with secondary repolarization abnormality Artifact in lead(s) I II aVR aVL V1 V2 similar to May 2024 Confirmed by Pricilla Loveless [phone]) on 09/07/2022 7:32:53 PM  Radiology DG Foot Complete Right  Result Date: 09/07/2022 CLINICAL DATA:  AMS swollen foot of uncertain etiology EXAM: RIGHT FOOT COMPLETE - 3+ VIEW COMPARISON:  None Available. FINDINGS: Osteopenia. Age indeterminate fracture of the medial malleolus joint  spaces and alignment are maintained. No area of erosion or osseous destruction. No unexpected radiopaque foreign body. Diffuse soft tissue edema. IMPRESSION: Age indeterminate fracture of the medial malleolus. Recommend dedicated ankle radiographs when clinically appropriate. Electronically Signed   By: Meda Klinefelter M.D.   On: 09/07/2022 18:47   DG Chest Port 1 View  Result Date: 09/07/2022 CLINICAL DATA:  Altered mental status. EXAM: PORTABLE CHEST 1 VIEW COMPARISON:  Chest radiograph dated 08/10/2022. FINDINGS: Support apparatus in similar position. No focal consolidation, pleural effusion, or pneumothorax. Mild cardiomegaly. Atherosclerotic calcification of the aortic arch. No acute osseous pathology. IMPRESSION: 1. No acute cardiopulmonary process. 2. Mild cardiomegaly. Electronically Signed   By: Elgie Collard M.D.   On: 09/07/2022 18:31   CT ANGIO HEAD NECK W WO CM W PERF (CODE STROKE)  Result Date: 09/07/2022 CLINICAL DATA:  Neuro deficit, acute, stroke suspected. EXAM: CT ANGIOGRAPHY HEAD AND NECK CT PERFUSION BRAIN TECHNIQUE: Multidetector CT imaging of the head and neck was performed using the standard protocol during bolus administration of intravenous contrast. Multiplanar CT image reconstructions and MIPs were obtained to evaluate the vascular anatomy. Carotid stenosis measurements (when applicable) are obtained utilizing NASCET criteria, using the distal internal carotid diameter as the denominator. Multiphase CT imaging of the brain was performed following IV bolus contrast injection. Subsequent parametric perfusion maps were calculated using RAPID software. RADIATION DOSE REDUCTION: This exam was performed according to the departmental dose-optimization program which includes automated exposure control, adjustment of the mA and/or kV according to patient size and/or use of iterative reconstruction technique. CONTRAST:  OMNIPAQUE IOHEXOL 350 MG/ML SOLN COMPARISON:  Head CT  immediately prior. FINDINGS: CTA NECK FINDINGS Aortic arch: Branching pattern shows the left vertebral artery arising directly from the arch. No origin stenosis. Right carotid system: Common carotid artery widely patent to the bifurcation. Carotid bifurcation is normal without soft or calcified plaque. Cervical ICA is somewhat tortuous but widely patent. Left carotid system: Left common carotid artery widely patent to  the bifurcation. Minimal calcified plaque in the ICA bulb but no stenosis. Cervical ICA patent beyond that. Vertebral arteries: Both vertebral artery origins are patent. As noted above, the left vertebral artery arises directly from the arch. Both vessels are widely patent through the cervical region to the foramen magnum. Skeleton: Ordinary cervical spondylosis. Other neck: No lymphadenopathy. Few small thyroid nodules. The largest on the right measures 9 mm. No follow-up recommended. Upper chest: Lung apices are clear. Review of the MIP images confirms the above findings CTA HEAD FINDINGS Anterior circulation: Both internal carotid arteries are patent through the skull base and siphon regions. No siphon stenosis. The anterior and middle cerebral vessels are patent. Both anterior cerebral arteries receive there supply from the left carotid circulation. No large vessel occlusion or proximal stenosis. No aneurysm or vascular malformation. Posterior circulation: Both vertebral arteries are patent through the foramen magnum to the basilar artery. No basilar stenosis. The basilar artery is a small vessel because of fetal origin of both posterior cerebral arteries. No branch vessel occlusion is seen. Venous sinuses: Patent and normal. Anatomic variants: None significant. Review of the MIP images confirms the above findings CT Brain Perfusion Findings: ASPECTS: 10 CBF (<30%) Volume: 0mL Perfusion (Tmax>6.0s) volume: 26mL. I believe this to be artifactual, related to a degree of motion and the VP shunt.  Mismatch Volume: 26mL calculated. As above, I believe this is artifactual. Infarction Location:None IMPRESSION: 1. No large vessel occlusion. No stenosis of the carotid bifurcations. 2. No intracranial large or medium vessel occlusion. Fetal origin of both posterior cerebral arteries. 3. CT perfusion study shows no evidence of core infarction. 26 mL of apparent ischemia is thought to be artifactual. Electronically Signed   By: Paulina Fusi M.D.   On: 09/07/2022 17:48   CT HEAD CODE STROKE WO CONTRAST  Result Date: 09/07/2022 CLINICAL DATA:  Code stroke. Neuro deficit, acute, stroke suspected. EXAM: CT HEAD WITHOUT CONTRAST TECHNIQUE: Contiguous axial images were obtained from the base of the skull through the vertex without intravenous contrast. RADIATION DOSE REDUCTION: This exam was performed according to the departmental dose-optimization program which includes automated exposure control, adjustment of the mA and/or kV according to patient size and/or use of iterative reconstruction technique. COMPARISON:  CT head without contrast 08/10/2022 FINDINGS: Brain: No acute infarct, hemorrhage, or mass lesion is present. Right frontal ventriculostomy catheter is stable. No significant white matter lesions are present. Deep brain nuclei are within normal limits. The ventricles are of normal size. No significant extraaxial fluid collection is present. The brainstem and cerebellum are within normal limits. Midline structures are within normal limits. Vascular: No hyperdense vessel or unexpected calcification. Skull: No significant extracranial soft tissue lesion is present. Calvarium is intact apart from the burr hole for the ventriculostomy catheter. No focal lytic or blastic lesions are present. Sinuses/Orbits: The paranasal sinuses and mastoid air cells are clear. The globes and orbits are within normal limits. ASPECTS [facility] Stroke Program Early CT Score) - Ganglionic level infarction (caudate, lentiform nuclei,  internal capsule, insula, M1-M3 cortex): 7/7 - Supraganglionic infarction (M4-M6 cortex): 3/3 Total score (0-10 with 10 being normal): 10/10 IMPRESSION: 1. No acute intracranial abnormality or significant interval change. 2. Stable right frontal ventriculostomy catheter. 3. Aspects is 10/10. The above was relayed via text pager to Dr. Amada Jupiter on 09/07/2022 at 17:28 . Electronically Signed   By: Marin Roberts M.D.   On: 09/07/2022 17:29    Procedures .Critical Care  Performed by: Pricilla Loveless, MD Authorized by:  Pricilla Loveless, MD   Critical care provider statement:    Critical care time (minutes):  45   Critical care time was exclusive of:  Separately billable procedures and treating other patients   Critical care was necessary to treat or prevent imminent or life-threatening deterioration of the following conditions:  CNS failure or compromise and sepsis   Critical care was time spent personally by me on the following activities:  Development of treatment plan with patient or surrogate, discussions with consultants, evaluation of patient's response to treatment, examination of patient, ordering and review of laboratory studies, ordering and review of radiographic studies, ordering and performing treatments and interventions, pulse oximetry, re-evaluation of patient's condition and review of old charts     Medications Ordered in ED Medications  metroNIDAZOLE (FLAGYL) IVPB 500 mg (500 mg Intravenous New Bag/Given 09/07/22 1854)  vancomycin (VANCOREADY) IVPB 2000 mg/400 mL (2,000 mg Intravenous New Bag/Given 09/07/22 1912)  vancomycin variable dose per unstable renal function (pharmacist dosing) (has no administration in time range)  ceFEPIme (MAXIPIME) 1 g in sodium chloride 0.9 % 100 mL IVPB (has no administration in time range)  sodium chloride flush (NS) 0.9 % injection 3 mL (3 mLs Intravenous Given 09/07/22 1850)  iohexol (OMNIPAQUE) 350 MG/ML injection 100 mL (100 mLs Intravenous  Contrast Given 09/07/22 1739)  ceFEPIme (MAXIPIME) 2 g in sodium chloride 0.9 % 100 mL IVPB (2 g Intravenous New Bag/Given 09/07/22 1853)  iohexol (OMNIPAQUE) 350 MG/ML injection 50 mL (50 mLs Intravenous Contrast Given 09/07/22 1938)    ED Course/ Medical Decision Making/ A&P Clinical Course as of 09/07/22 2241  Mon Sep 07, 2022  2110 Dr Adela Glimpse asks for [facility] to be notified to see if this is related to her ALL and/or treatments and to consider transfer.  [SG]    Clinical Course User Index [SG] Pricilla Loveless, MD                             Medical Decision Making Amount and/or Complexity of Data Reviewed Labs: ordered.    Details: Chronic renal failure.  Normal white blood cell count and no neutropenia. Radiology: ordered and independent interpretation performed.    Details: No head bleed on CT head.  No clear pneumonia or PE on chest x-ray or CTA. ECG/medicine tests: ordered and independent interpretation performed.    Details: No acute ischemia.  Risk Prescription drug management.   Patient presents as a code stroke.  Has acute altered mental status.  I do not appreciate a facial droop though she was worked up acutely for stroke and there is no clear LVO and presentation was not strong enough to consider TNK for neurology (there was also some question of the actual last known well.).  Given this, she was worked up for more general altered mental status.  She was treated broadly for possible sepsis given her immunosuppression but at this point there is no clear signs of an infection.  She has remained altered.  At 1 point she was given some Valium as we were trying to get her to MRI but she is still restless and unable to get an MRI at this time.  As above I discussed with hospitalist, I have consulted with [facility]. Unfortunately, there have been multiple pages and no response from Hem/Onc yet through the PAL line (over 2 hours). Thus, hospitalist admission still on hold as not clear  if she will go to [place] or not.  Of note, no fever or meningismus. I don't think meningitis is likely, will hold on LP for now. Care to Dr. Eudelia Bunch.         Final Clinical Impression(s) / ED Diagnoses Final diagnoses:  Altered mental status, unspecified altered mental status type    Rx / DC Orders ED Discharge Orders     None         Pricilla Loveless, MD 09/07/22 2338

## 2022-09-07 NOTE — ED Provider Notes (Signed)
I assumed care of this patient from previous provider.  Please see their note for further details of history, exam, and MDM.   Briefly patient is a 62 y.o. female who presented for AMS from HD. 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 Work up thus far is reassuring. Unable to get MRI 2/2 agitation despite anxiolytic. [facility] consulted as they are patient's primary oncology .   Is AMS related ALL or Chemo related? Does she need to be at [facility] or can she stay here with them providing recommendations?   Clinical Course as of 09/08/22 0755  Mon Sep 07, 2022  2110 Dr Adela Glimpse asks for [facility] to be notified to see if this is related to her ALL and/or treatments and to consider transfer.  [SG]  Tue Sep 08, 2022  0206 I spoke to Dr. Lowell Guitar from [facility] oncology.  He inquired whether the patient was on Blincyto pump as this could cause altered mental status.  The patient is currently on pump but is not hooked up.  He recommended discontinuing which it appears that already has been.  He also recommended giving the patient 10 mg of Decadron.  They accepted patient for admission to wait for [facility].  Currently pending availability. [PC]  [facility] has a bed assigned.  Coordinating transfer. I reevaluated the patient and she is hemodynamically stable.  [PC]  0755 Stable. Transportation on the way [PC]    Clinical Course User Index [PC] Adanely Reynoso, Amadeo Garnet, MD [SG] Pricilla Loveless, MD   .Critical Care  Performed by: Nira Conn, MD Authorized by: Nira Conn, MD   Critical care provider statement:    Critical care time (minutes):  45   Critical care time was exclusive of:  Separately billable procedures and treating other patients   Critical care was necessary to treat or prevent imminent or life-threatening deterioration of the following conditions:  Toxidrome   Critical care was time spent personally by me on the following activities:  Development of treatment plan with  patient or surrogate, discussions with consultants, evaluation of patient's response to treatment, examination of patient, obtaining history from patient or surrogate, review of old charts, re-evaluation of patient's condition, pulse oximetry, ordering and review of radiographic studies, ordering and review of laboratory studies and ordering and performing treatments and interventions   Care discussed with: accepting provider at another facility       Nira Conn, MD 09/08/22 [phone]

## 2022-09-07 NOTE — Subjective & Objective (Signed)
Came in to [facility] by [facility] EMS code stroke was activated patient comes from hemodialysis center complaining of left facial weakness and slurred speech Patient with right hemianopsia left facial droop right arm weakness bilateral leg weakness aphasia and dysarthria Noted candidate for IV thrombolytics last known well 11:20 AM

## 2022-09-07 NOTE — Plan of Care (Signed)
Tracey Stevens UJW:[id number] DOB: [birth year] DOA: 09/07/2022     PCP: Tracey Harries, MD   Outpatient Specialists: * NONE CARDS: * Dr. None  NEphrology: *  Dr. NEurology *   Dr. Pulmonary *  Dr.  Oncology  Dr.Clark, [facility]  GI* Dr.  Deboraha Sprang, [facility]) No care team member to display Urology Dr. *  Patient arrived to ER on 09/07/22 at 1711 Referred by Attending Pricilla Loveless, MD   Patient coming from:    home Lives alone,   *** With family From facility ***  Chief Complaint: *** Chief Complaint  Patient presents with  . Code Stroke    HPI: Tracey Stevens is a 62 y.o. female with medical history significant of ESRD on HD MWF , Bcell lymphoma    Presented with  code stroke being treated for ALL at [facility] Came in to [facility] code stroke was activated patient comes from hemodialysis center complaining of left facial weakness and slurred speech Patient with right hemianopsia left facial droop right arm weakness bilateral leg weakness aphasia and dysarthria Noted candidate for IV thrombolytics last known well 11:20 AM      Denies significant ETOH intake *** Does not smoke*** but interested in quitting***  Lab Results  Component Value Date   SARSCOV2NAA NEGATIVE 09/07/2022   SARSCOV2NAA NEGATIVE 03/22/2021   SARSCOV2NAA NEGATIVE 03/20/2021   SARSCOV2NAA NEGATIVE 12/11/2020        Regarding pertinent Chronic problems: ***  ****Hyperlipidemia - *on statins {statin:315258}  Lipid Panel  No results found for: "CHOL", "TRIG", "HDL", "CHOLHDL", "VLDL", "LDLCALC", "LDLDIRECT", "LABVLDL"  ***HTN on   ***chronic CHF diastolic/systolic/ combined - last echo*** No results found for this or any previous visit (from the past 56213 hour(s)).  *** CAD  - On Aspirin, statin, betablocker, Plavix                 - *followed by cardiology                - last cardiac cath       ***DM 2 - No results found for: "HGBA1C" ****on insulin, PO meds only, diet  controlled  ***Hypothyroidism: No results found for: "TSH" on synthroid  *** Morbid obesity-   BMI Readings from Last 1 Encounters:  09/07/22 37.06 kg/m     *** Asthma -well *** controlled on home inhalers/ nebs                         ***last no prior***admission  ***                       No ***history of intubation  *** COPD - not **followed by pulmonology *** not  on baseline oxygen  *L,    *** OSA -on nocturnal oxygen, *CPAP, *noncompliant with CPAP  *** Hx of CVA - *with/out residual deficits on Aspirin 81 mg, 325, Plavix  ***A. Fib -  - CHA2DS2 vas score **** CHA2DS2/VAS Stroke Risk Points      N/A >= 2 Points: High Risk  1 - 1.99 Points: Medium Risk  0 Points: Low Risk    Last Change: N/A      This score determines the patient's risk of having a stroke if the  patient has atrial fibrillation.      This score is not applicable to this patient. Components are not  calculated.     current  on anticoagulation  with ****Coumadin  ***Xarelto,* Eliquis,  *** Not on anticoagulation secondary to Risk of Falls, *** recurrent bleeding         -  Rate control:  Currently controlled with ***Toprolol,  *Metoprolol,* Diltiazem, *Coreg          - Rhythm control: *** amiodarone, *flecainide  ***Hx of DVT/PE on - anticoagulation with ****Coumadin  ***Xarelto,* Eliquis,     ***CKD stage III*- baseline Cr **** Estimated Creatinine Clearance: 15.3 mL/min (A) (by C-G formula based on SCr of 5.7 mg/dL (H)).  Lab Results  Component Value Date   CREATININE 5.70 (H) 09/07/2022   CREATININE 5.85 (H) 09/07/2022   CREATININE 15.50 (H) 08/10/2022     **** Liver disease MELD 3.0: 20 at 09/07/2022  8:28 PM MELD-Na: 21 at 09/07/2022  8:28 PM Calculated from: Serum Creatinine: 5.70 mg/dL (Using max of 3 mg/dL) at 1/61/0960  4:54 PM Serum Sodium: 138 mmol/L (Using max of 137 mmol/L) at 09/07/2022  8:28 PM Total Bilirubin: 1.0 mg/dL at 0/98/1191  4:78 PM Serum Albumin: 3.1 g/dL at 2/95/6213   0:86 PM INR(ratio): 1.1 at 09/07/2022  3:17 PM Age at listing (hypothetical): 62 years Sex: Female at 09/07/2022  8:28 PM    ***BPH - on Flomax, Proscar    *** Dementia - on Aricept** Nemenda  *** Chronic anemia - baseline hg Hemoglobin & Hematocrit  Recent Labs    09/07/22 1517 09/07/22 1720 09/07/22 2028  HGB 9.5* 10.2* 9.5*   Iron/TIBC/Ferritin/ %Sat No results found for: "IRON", "TIBC", "FERRITIN", "IRONPCTSAT"   Seizure DO - las seizure *** currently on     While in ER: Clinical Course as of 09/07/22 2111  Mon Sep 07, 2022  2110 Dr Adela Glimpse asks for [facility] to be notified to see if this is related to her ALL and/or treatments and to consider transfer.  [SG]    Clinical Course User Index [SG] Pricilla Loveless, MD         Lab Orders         Resp panel by RT-PCR (RSV, Flu A&B, Covid) Anterior Nasal Swab         Blood Culture (routine x 2)         Protime-INR         APTT         CBC         Differential         Comprehensive metabolic panel         Ethanol         Lactic acid, plasma         Urinalysis, w/ Reflex to Culture (Infection Suspected) -Urine, Catheterized         I-stat chem 8, ED         CBG monitoring, ED         I-Stat arterial blood gas, ED         I-Stat venous blood gas, ED      CT HEAD *** NON acute    CXR - ***NON acute  CTabd/pelvis - ***nonacute  CTA chest - ***nonacute, no PE, * no evidence of infiltrate  Following Medications were ordered in ER: Medications  vancomycin (VANCOREADY) IVPB 2000 mg/400 mL (2,000 mg Intravenous New Bag/Given 09/07/22 1912)  vancomycin variable dose per unstable renal function (pharmacist dosing) (has no administration in time range)  ceFEPIme (MAXIPIME) 1 g in sodium chloride 0.9 % 100 mL IVPB (has no administration in time range)  sodium chloride flush (NS)  0.9 % injection 3 mL (3 mLs Intravenous Given 09/07/22 1850)  iohexol (OMNIPAQUE) 350 MG/ML injection 100 mL (100 mLs Intravenous Contrast  Given 09/07/22 1739)  ceFEPIme (MAXIPIME) 2 g in sodium chloride 0.9 % 100 mL IVPB (0 g Intravenous Stopped 09/07/22 2005)  metroNIDAZOLE (FLAGYL) IVPB 500 mg (0 mg Intravenous Stopped 09/07/22 2005)  iohexol (OMNIPAQUE) 350 MG/ML injection 50 mL (50 mLs Intravenous Contrast Given 09/07/22 1938)  diazepam (VALIUM) injection 5 mg (5 mg Intravenous Given 09/07/22 2043)    _______________________________________________________ ER Provider Called:     DrMarland Kitchen  They Recommend admit to medicine *** Will see in AM  ***SEEN in ER   ED Triage Vitals  Enc Vitals Group     BP 09/07/22 1715 131/74     Pulse Rate 09/07/22 1715 (!) 104     Resp 09/07/22 1745 18     Temp 09/07/22 1745 97.9 F (36.6 C)     Temp Source 09/07/22 1745 Rectal     SpO2 09/07/22 1715 97 %     Weight 09/07/22 1719 280 lb 13.9 oz (127.4 kg)     Height --      Head Circumference --      Peak Flow --      Pain Score 09/07/22 1743 5     Pain Loc --      Pain Edu? --      Excl. in GC? --   TMAX(24)@     _________________________________________ Significant initial  Findings: Abnormal Labs Reviewed  CBC - Abnormal; Notable for the following components:      Result Value   RBC 2.89 (*)    Hemoglobin 9.5 (*)    HCT 30.8 (*)    MCV 106.6 (*)    All other components within normal limits  DIFFERENTIAL - Abnormal; Notable for the following components:   Lymphs Abs 0.6 (*)    All other components within normal limits  COMPREHENSIVE METABOLIC PANEL - Abnormal; Notable for the following components:   Potassium 3.0 (*)    Chloride 97 (*)    Creatinine, Ser 5.85 (*)    Calcium 8.1 (*)    Total Protein 5.2 (*)    Albumin 3.1 (*)    AST 10 (*)    GFR, Estimated 8 (*)    All other components within normal limits  I-STAT CHEM 8, ED - Abnormal; Notable for the following components:   Potassium 3.1 (*)    Chloride 97 (*)    Creatinine, Ser 5.70 (*)    Calcium, Ion 0.97 (*)    Hemoglobin 10.2 (*)    HCT 30.0 (*)    All other  components within normal limits  I-STAT VENOUS BLOOD GAS, ED - Abnormal; Notable for the following components:   pH, Ven 7.558 (*)    pCO2, Ven 33.9 (*)    pO2, Ven 29 (*)    Bicarbonate 30.2 (*)    Acid-Base Excess 8.0 (*)    Potassium 3.3 (*)    Calcium, Ion 0.96 (*)    HCT 28.0 (*)    Hemoglobin 9.5 (*)    All other components within normal limits      _________________________ Troponin ***ordered Cardiac Panel (last 3 results) No results for input(s): "CKTOTAL", "CKMB", "TROPONINIHS", "RELINDX" in the last 72 hours.   ECG: Ordered Personally reviewed and interpreted by me showing: HR : *** Rhythm: *NSR, Sinus tachycardia * A.fib. W RVR, RBBB, LBBB, Paced Ischemic changes*nonspecific changes, no evidence of ischemic changes  QTC*  BNP (last 3 results) No results for input(s): "BNP" in the last 8760 hours.   COVID-19 Labs  No results for input(s): "DDIMER", "FERRITIN", "LDH", "CRP" in the last 72 hours.  Lab Results  Component Value Date   SARSCOV2NAA NEGATIVE 09/07/2022   SARSCOV2NAA NEGATIVE 03/22/2021   SARSCOV2NAA NEGATIVE 03/20/2021   SARSCOV2NAA NEGATIVE 12/11/2020       ____________________ This patient meets SIRS Criteria and may be septic. SIRS = Systemic Inflammatory Response Syndrome  Order a lactic acid level if needed AND/OR Initiate the sepsis protocol with the attached order set OR Click "Treating Associated Infection or Illness" if the patient is being treated for an infection that is a known cause of these abnormalities     The recent clinical data is shown below. Vitals:   09/07/22 1745 09/07/22 1845 09/07/22 1910 09/07/22 2038  BP: 131/77 (!) 140/92 (!) 171/108 101/72  Pulse: (!) 101 (!) 107 (!) 109 (!) 102  Resp: 18 (!) 26 (!) 29 (!) 22  Temp: 97.9 F (36.6 C)   98 F (36.7 C)  TempSrc: Rectal   Temporal  SpO2: 98% 97% 97% 96%  Weight:            WBC     Component Value Date/Time   WBC 5.8 09/07/2022 1517   LYMPHSABS 0.6  (L) 09/07/2022 1517   LYMPHSABS 1.7 01/05/2017 1047   MONOABS 1.0 09/07/2022 1517   MONOABS 0.4 01/05/2017 1047   EOSABS 0.0 09/07/2022 1517   EOSABS 0.2 01/05/2017 1047   BASOSABS 0.0 09/07/2022 1517   BASOSABS 0.0 01/05/2017 1047        Lactic Acid, Venous    Component Value Date/Time   LATICACIDVEN 1.4 09/07/2022 1812      Lactic Acid, Venous    Component Value Date/Time   LATICACIDVEN 1.4 09/07/2022 1812    Procalcitonin *** Ordered      UA *** no evidence of UTI  ***Pending ***not ordered   Urine analysis:    Component Value Date/Time   COLORURINE YELLOW (A) 12/11/2020 2129   APPEARANCEUR CLEAR (A) 12/11/2020 2129   LABSPEC 1.015 12/11/2020 2129   PHURINE 6.0 12/11/2020 2129   GLUCOSEU NEGATIVE 12/11/2020 2129   HGBUR MODERATE (A) 12/11/2020 2129   BILIRUBINUR NEGATIVE 12/11/2020 2129   KETONESUR NEGATIVE 12/11/2020 2129   PROTEINUR >300 (A) 12/11/2020 2129   NITRITE NEGATIVE 12/11/2020 2129   LEUKOCYTESUR NEGATIVE 12/11/2020 2129    Results for orders placed or performed during the hospital encounter of 09/07/22  Resp panel by RT-PCR (RSV, Flu A&B, Covid) Anterior Nasal Swab     Status: None   Collection Time: 09/07/22  5:47 PM   Specimen: Anterior Nasal Swab  Result Value Ref Range Status   SARS Coronavirus 2 by RT PCR NEGATIVE NEGATIVE Final   Influenza A by PCR NEGATIVE NEGATIVE Final   Influenza B by PCR NEGATIVE NEGATIVE Final    Comment: (NOTE) The Xpert Xpress SARS-CoV-2/FLU/RSV plus assay is intended as an aid in the diagnosis of influenza from Nasopharyngeal swab specimens and should not be used as a sole basis for treatment. Nasal washings and aspirates are unacceptable for Xpert Xpress SARS-CoV-2/FLU/RSV testing.  Fact Sheet for Patients: BloggerCourse.com  Fact Sheet for Healthcare Providers: SeriousBroker.it  This test is not yet approved or cleared by the [place] FDA and has  been authorized for detection and/or diagnosis of SARS-CoV-2 by FDA under an Emergency Use Authorization (EUA). This EUA will remain in effect (meaning  this test can be used) for the duration of the COVID-19 declaration under Section 564(b)(1) of the Act, 21 U.S.C. section 360bbb-3(b)(1), unless the authorization is terminated or revoked.     Resp Syncytial Virus by PCR NEGATIVE NEGATIVE Final    Comment: (NOTE) Fact Sheet for Patients: BloggerCourse.com  Fact Sheet for Healthcare Providers: SeriousBroker.it  This test is not yet approved or cleared by the [place] FDA and has been authorized for detection and/or diagnosis of SARS-CoV-2 by FDA under an Emergency Use Authorization (EUA). This EUA will remain in effect (meaning this test can be used) for the duration of the COVID-19 declaration under Section 564(b)(1) of the Act, 21 U.S.C. section 360bbb-3(b)(1), unless the authorization is terminated or revoked.  Performed at [facility], [address], [place], [place] [address]     ABX started Antibiotics Given (last 72 hours)     Date/Time Action Medication Dose Rate   09/07/22 1853 New Bag/Given   ceFEPIme (MAXIPIME) 2 g in sodium chloride 0.9 % 100 mL IVPB 2 g 200 mL/hr   09/07/22 1854 New Bag/Given   metroNIDAZOLE (FLAGYL) IVPB 500 mg 500 mg 100 mL/hr   09/07/22 1912 New Bag/Given   vancomycin (VANCOREADY) IVPB 2000 mg/400 mL 2,000 mg 200 mL/hr       No results found for the last 90 days.     ________________________________________________________________  Arterial ***Venous  Blood Gas result:  pH *** pCO2 ***; pO2 ***;     %O2 Sat ***.  ABG    Component Value Date/Time   HCO3 30.2 (H) 09/07/2022 2028   TCO2 31 09/07/2022 2028   O2SAT 65 09/07/2022 2028  __________________________________________________________ Recent Labs  Lab 09/07/22 1517 09/07/22 1720 09/07/22 2028  NA 137 139 138   K 3.0* 3.1* 3.3*  CO2 27  --   --   GLUCOSE 86 85  --   BUN 17 17  --   CREATININE 5.85* 5.70*  --   CALCIUM 8.1*  --   --     Cr  * stable,  Up from baseline see below Lab Results  Component Value Date   CREATININE 5.70 (H) 09/07/2022   CREATININE 5.85 (H) 09/07/2022   CREATININE 15.50 (H) 08/10/2022    Recent Labs  Lab 09/07/22 1517  AST 10*  ALT 8  ALKPHOS 111  BILITOT 1.0  PROT 5.2*  ALBUMIN 3.1*   Lab Results  Component Value Date   CALCIUM 8.1 (L) 09/07/2022   PHOS 4.8 (H) 08/10/2022          Plt: Lab Results  Component Value Date   PLT 189 09/07/2022         Recent Labs  Lab 09/07/22 1517 09/07/22 1720 09/07/22 2028  WBC 5.8  --   --   NEUTROABS 4.1  --   --   HGB 9.5* 10.2* 9.5*  HCT 30.8* 30.0* 28.0*  MCV 106.6*  --   --   PLT 189  --   --     HG/HCT * stable,  Down *Up from baseline see below    Component Value Date/Time   HGB 9.5 (L) 09/07/2022 2028   HGB 8.4 (L) 09/11/2020 1609   HGB 13.1 01/05/2017 1047   HCT 28.0 (L) 09/07/2022 2028   HCT 38.9 01/05/2017 1047   MCV 106.6 (H) 09/07/2022 1517   MCV 91.1 01/05/2017 1047   _______________________________________________ Hospitalist was called for admission for *** There are no diagnoses linked to this encounter.  The following Work up has been ordered so far:  Orders Placed This Encounter  Procedures  . Resp panel by RT-PCR (RSV, Flu A&B, Covid) Anterior Nasal Swab  . Blood Culture (routine x 2)  . CT HEAD CODE STROKE WO CONTRAST  . CT ANGIO HEAD NECK W WO CM W PERF (CODE STROKE)  . DG Chest Port 1 View  . DG Foot Complete Right  . MR BRAIN WO CONTRAST  . CT Angio Chest PE W and/or Wo Contrast  . Protime-INR  . APTT  . CBC  . Differential  . Comprehensive metabolic panel  . Ethanol  . Lactic acid, plasma  . Urinalysis, w/ Reflex to Culture (Infection Suspected) -Urine, Catheterized  . Diet NPO time specified  . ED Cardiac monitoring  . Swallow screen (NPO until  completed)  . NIH Stroke Scale  . Saline Lock IV, Maintain IV access  . If O2 sat  . Check Rectal Temperature  . Document height and weight  . Assess and Document Glasgow Coma Scale  . Document vital signs within 1-hour of fluid bolus completion. Notify provider of abnormal vital signs despite fluid resuscitation.  . DO NOT delay antibiotics if unable to obtain blood culture.  . Refer to Sidebar Report: Sepsis Sidebar ED/IP  . Notify provider for difficulties obtaining IV access.  . Insert peripheral IV x 2  . Initiate Carrier Fluid Protocol  . Code Sepsis activation.  This occurs automatically when order is signed and prioritizes pharmacy, lab, and radiology services for STAT collections and interventions.  If [facility] downtime, call [facility] [phone]) to activate Code Sepsis.  Marland Kitchen pharmacy consult  . ceFEPime (MAXIPIME) per pharmacy consult           . vancomycin per pharmacy consult  . Consult to hospitalist  . I-stat chem 8, ED  . CBG monitoring, ED  . I-Stat arterial blood gas, ED  . I-Stat venous blood gas, ED  . ED EKG  . EKG 12-Lead  . ED EKG 12-Lead     OTHER Significant initial  Findings:  labs showing:     DM  labs:  HbA1C: No results for input(s): "HGBA1C" in the last 8760 hours.     CBG (last 3)  Recent Labs    09/07/22 1715  GLUCAP 84      Cultures:    Component Value Date/Time   SDES BLOOD RIGHT HAND 08/11/2022 0029   SPECREQUEST  08/11/2022 0029    BOTTLES DRAWN AEROBIC AND ANAEROBIC Blood Culture adequate volume   CULT  08/11/2022 0029    NO GROWTH 5 DAYS Performed at [facility], [address], [place], [place] [address]    REPTSTATUS 08/16/2022 FINAL 08/11/2022 0029     Radiological Exams on Admission: CT Angio Chest PE W and/or Wo Contrast  Result Date: 09/07/2022 CLINICAL DATA:  Presented from dialysis center to ED with altered mental status, obtained CTA head and neck with no LV O or significant stenotic lesions. No core  infarction seen on CT perfusion study. Pulmonary embolism suspected. High probability.Patient shaking during study with limited image quality as a result. EXAM: CT ANGIOGRAPHY CHEST WITH CONTRAST TECHNIQUE: Multidetector CT imaging of the chest was performed using the standard protocol during bolus administration of intravenous contrast. Multiplanar CT image reconstructions and MIPs were obtained to evaluate the vascular anatomy. RADIATION DOSE REDUCTION: This exam was performed according to the departmental dose-optimization program which includes automated exposure control, adjustment of the mA and/or kV according  to patient size and/or use of iterative reconstruction technique. CONTRAST:  50mL OMNIPAQUE IOHEXOL 350 MG/ML SOLN COMPARISON:  Portable chest today, portable chest 08/10/2022, AP Lat chest 08/06/2021, chest CT without contrast 12/11/2020. FINDINGS: Cardiovascular: There is a right chest port with IJ approach catheter terminating just above the superior cavoatrial junction, left IJ dialysis catheter with the tip just below the superior cavoatrial junction. There is abundant patient motion on exam. The heart is slightly enlarged. There is no pericardial effusion. There are no visible coronary artery calcifications. There are small amounts of scattered soft plaque in the descending thoracic aorta with tortuous aorta and no appreciable calcific plaques, aneurysm, stenosis or dissection. The great vessels are clear. The pulmonary trunk is upper normal in caliber. The pulmonary veins are normal caliber. Pulmonary arteries show no embolic filling defects at least through the main lobar arteries. The segmental and subsegmental arteries are obscured due to patient motion and not evaluated. Mediastinum/Nodes: There is a 1.8 cm heterogeneous nodule in the posterior aspect of the right lobe of the thyroid gland. Nonemergent ultrasound follow-up is recommended. This may have been present previously but not  appreciated without IV contrast. There is no intrathoracic or axillary adenopathy. The thoracic esophagus, thoracic trachea and main bronchi are unremarkable. Lungs/Pleura: Eventration and mild elevation left hemidiaphragm. There is no pleural effusion, thickening or pneumothorax. There is a low inspiration on exam. There are increased bands of atelectasis in the lower lobes. No focal pneumonia is evident. There is a 10 mm noncalcified right lower lobe nodule on 6:101 which is unchanged in size since 12/11/2020. No other nodules are seen. There is no significant bronchial thickening. Upper Abdomen: 6.1 cm lobulated cyst again noted in the superior pole left kidney, similar size as previously with prior renal ultrasound 12/12/2020 noting a simple cyst in this location. Both kidneys are atrophic consistent with end-stage renal failure. There is a 3 mm nonobstructive stone in the inferior pole left kidney. Small cysts are noted on the right with both kidneys having undergone significant interval volume loss since 2022. Adenomatous hyperplasia of both adrenal glands is also again noted. There are no acute upper abdominal imaging findings. Musculoskeletal: There is a chronic moderate to severe compression fracture with 3 mm retropulsion at T12. There is advanced degenerative disc disease with spondylosis in the thoracic spine with mild thoracic kyphodextroscoliosis. No focal chest wall abnormality. Review of the MIP images confirms the above findings. IMPRESSION: 1. No arterial embolus is seen at least through the main lobar arteries. The segmental and subsegmental arteries are obscured due to patient motion. 2. Slight cardiomegaly with borderline prominence of the pulmonary trunk. No venous dilatation or edema. 3. Low inspiration with increased bands of atelectasis in the lower lobes. No focal pneumonia. 4. 1.8 cm heterogeneous nodule in the right lobe of the thyroid gland. Nonemergent ultrasound follow-up is  recommended. 5. 10 mm noncalcified right lower lobe nodule, unchanged since 12/11/2020. Follow-up chest CT recommended either in late September or in October of 2024 to confirm 2 years of stability. 6. Minimal aortic noncalcified atherosclerosis. 7. Atrophic kidneys consistent with end-stage renal failure. 8. Chronic moderate to severe T12 compression fracture with 3 mm retropulsion. 9. Nonobstructive 3 mm stone in the inferior pole left kidney. 10. Chronic adenomatous hyperplasia of both adrenal glands. 11. Additional findings described above. Aortic Atherosclerosis (ICD10-I70.0). Electronically Signed   By: Almira Bar M.D.   On: 09/07/2022 20:25   DG Foot Complete Right  Result Date: 09/07/2022 CLINICAL DATA:  AMS swollen foot of uncertain etiology EXAM: RIGHT FOOT COMPLETE - 3+ VIEW COMPARISON:  None Available. FINDINGS: Osteopenia. Age indeterminate fracture of the medial malleolus joint spaces and alignment are maintained. No area of erosion or osseous destruction. No unexpected radiopaque foreign body. Diffuse soft tissue edema. IMPRESSION: Age indeterminate fracture of the medial malleolus. Recommend dedicated ankle radiographs when clinically appropriate. Electronically Signed   By: Meda Klinefelter M.D.   On: 09/07/2022 18:47   DG Chest Port 1 View  Result Date: 09/07/2022 CLINICAL DATA:  Altered mental status. EXAM: PORTABLE CHEST 1 VIEW COMPARISON:  Chest radiograph dated 08/10/2022. FINDINGS: Support apparatus in similar position. No focal consolidation, pleural effusion, or pneumothorax. Mild cardiomegaly. Atherosclerotic calcification of the aortic arch. No acute osseous pathology. IMPRESSION: 1. No acute cardiopulmonary process. 2. Mild cardiomegaly. Electronically Signed   By: Elgie Collard M.D.   On: 09/07/2022 18:31   CT ANGIO HEAD NECK W WO CM W PERF (CODE STROKE)  Result Date: 09/07/2022 CLINICAL DATA:  Neuro deficit, acute, stroke suspected. EXAM: CT ANGIOGRAPHY HEAD AND  NECK CT PERFUSION BRAIN TECHNIQUE: Multidetector CT imaging of the head and neck was performed using the standard protocol during bolus administration of intravenous contrast. Multiplanar CT image reconstructions and MIPs were obtained to evaluate the vascular anatomy. Carotid stenosis measurements (when applicable) are obtained utilizing NASCET criteria, using the distal internal carotid diameter as the denominator. Multiphase CT imaging of the brain was performed following IV bolus contrast injection. Subsequent parametric perfusion maps were calculated using RAPID software. RADIATION DOSE REDUCTION: This exam was performed according to the departmental dose-optimization program which includes automated exposure control, adjustment of the mA and/or kV according to patient size and/or use of iterative reconstruction technique. CONTRAST:  OMNIPAQUE IOHEXOL 350 MG/ML SOLN COMPARISON:  Head CT immediately prior. FINDINGS: CTA NECK FINDINGS Aortic arch: Branching pattern shows the left vertebral artery arising directly from the arch. No origin stenosis. Right carotid system: Common carotid artery widely patent to the bifurcation. Carotid bifurcation is normal without soft or calcified plaque. Cervical ICA is somewhat tortuous but widely patent. Left carotid system: Left common carotid artery widely patent to the bifurcation. Minimal calcified plaque in the ICA bulb but no stenosis. Cervical ICA patent beyond that. Vertebral arteries: Both vertebral artery origins are patent. As noted above, the left vertebral artery arises directly from the arch. Both vessels are widely patent through the cervical region to the foramen magnum. Skeleton: Ordinary cervical spondylosis. Other neck: No lymphadenopathy. Few small thyroid nodules. The largest on the right measures 9 mm. No follow-up recommended. Upper chest: Lung apices are clear. Review of the MIP images confirms the above findings CTA HEAD FINDINGS Anterior  circulation: Both internal carotid arteries are patent through the skull base and siphon regions. No siphon stenosis. The anterior and middle cerebral vessels are patent. Both anterior cerebral arteries receive there supply from the left carotid circulation. No large vessel occlusion or proximal stenosis. No aneurysm or vascular malformation. Posterior circulation: Both vertebral arteries are patent through the foramen magnum to the basilar artery. No basilar stenosis. The basilar artery is a small vessel because of fetal origin of both posterior cerebral arteries. No branch vessel occlusion is seen. Venous sinuses: Patent and normal. Anatomic variants: None significant. Review of the MIP images confirms the above findings CT Brain Perfusion Findings: ASPECTS: 10 CBF (<30%) Volume: 0mL Perfusion (Tmax>6.0s) volume: 26mL. I believe this to be artifactual, related to a degree of motion and the VP shunt.  Mismatch Volume: 26mL calculated. As above, I believe this is artifactual. Infarction Location:None IMPRESSION: 1. No large vessel occlusion. No stenosis of the carotid bifurcations. 2. No intracranial large or medium vessel occlusion. Fetal origin of both posterior cerebral arteries. 3. CT perfusion study shows no evidence of core infarction. 26 mL of apparent ischemia is thought to be artifactual. Electronically Signed   By: Paulina Fusi M.D.   On: 09/07/2022 17:48   CT HEAD CODE STROKE WO CONTRAST  Result Date: 09/07/2022 CLINICAL DATA:  Code stroke. Neuro deficit, acute, stroke suspected. EXAM: CT HEAD WITHOUT CONTRAST TECHNIQUE: Contiguous axial images were obtained from the base of the skull through the vertex without intravenous contrast. RADIATION DOSE REDUCTION: This exam was performed according to the departmental dose-optimization program which includes automated exposure control, adjustment of the mA and/or kV according to patient size and/or use of iterative reconstruction technique. COMPARISON:  CT  head without contrast 08/10/2022 FINDINGS: Brain: No acute infarct, hemorrhage, or mass lesion is present. Right frontal ventriculostomy catheter is stable. No significant white matter lesions are present. Deep brain nuclei are within normal limits. The ventricles are of normal size. No significant extraaxial fluid collection is present. The brainstem and cerebellum are within normal limits. Midline structures are within normal limits. Vascular: No hyperdense vessel or unexpected calcification. Skull: No significant extracranial soft tissue lesion is present. Calvarium is intact apart from the burr hole for the ventriculostomy catheter. No focal lytic or blastic lesions are present. Sinuses/Orbits: The paranasal sinuses and mastoid air cells are clear. The globes and orbits are within normal limits. ASPECTS [facility] Stroke Program Early CT Score) - Ganglionic level infarction (caudate, lentiform nuclei, internal capsule, insula, M1-M3 cortex): 7/7 - Supraganglionic infarction (M4-M6 cortex): 3/3 Total score (0-10 with 10 being normal): 10/10 IMPRESSION: 1. No acute intracranial abnormality or significant interval change. 2. Stable right frontal ventriculostomy catheter. 3. Aspects is 10/10. The above was relayed via text pager to Dr. Amada Jupiter on 09/07/2022 at 17:28 . Electronically Signed   By: Marin Roberts M.D.   On: 09/07/2022 17:29   _______________________________________________________________________________________________________ Latest  Blood pressure 101/72, pulse (!) 102, temperature 98 F (36.7 C), temperature source Temporal, resp. rate (!) 22, weight 127.4 kg, SpO2 96 %.   Vitals  labs and radiology finding personally reviewed  Review of Systems:    Pertinent positives include: ***  Constitutional:  No weight loss, night sweats, Fevers, chills, fatigue, weight loss  HEENT:  No headaches, Difficulty swallowing,Tooth/dental problems,Sore throat,  No sneezing, itching, ear ache,  nasal congestion, post nasal drip,  Cardio-vascular:  No chest pain, Orthopnea, PND, anasarca, dizziness, palpitations.no Bilateral lower extremity swelling  GI:  No heartburn, indigestion, abdominal pain, nausea, vomiting, diarrhea, change in bowel habits, loss of appetite, melena, blood in stool, hematemesis Resp:  no shortness of breath at rest. No dyspnea on exertion, No excess mucus, no productive cough, No non-productive cough, No coughing up of blood.No change in color of mucus.No wheezing. Skin:  no rash or lesions. No jaundice GU:  no dysuria, change in color of urine, no urgency or frequency. No straining to urinate.  No flank pain.  Musculoskeletal:  No joint pain or no joint swelling. No decreased range of motion. No back pain.  Psych:  No change in mood or affect. No depression or anxiety. No memory loss.  Neuro: no localizing neurological complaints, no tingling, no weakness, no double vision, no gait abnormality, no slurred speech, no confusion  All systems reviewed and apart  from HOPI all are negative _______________________________________________________________________________________________ Past Medical History:   Past Medical History:  Diagnosis Date  . Acute lymphoblastic leukemia (ALL) (HCC)   . Adrenal nodule (HCC)   . Anemia   . Cancer (HCC)    cervical  . Congestive heart failure (CHF) (HCC)   . Diffuse Large B Cell Lymphoma 2015 remission  . Dyspnea    SOB when she over exerts herself  . ESRD on dialysis [facility])    M-W-F  . Hearing loss   . History of blood transfusion   . Hyperparathyroidism (HCC)    s/p parathyroidectomy  . Hypertension   . Hypertension   . Multinodular goiter   . Multiple renal cysts   . Nephrolithiasis   . On antineoplastic chemotherapy   . Pre-diabetes    Most recent A1C 6.7- 09/13/20. No blood sugar checks  . Vertigo   . Vitamin D deficiency     Past Surgical History:  Procedure Laterality Date  . ABDOMINAL  HYSTERECTOMY    . BREAST BIOPSY Right over 10 years ago   benign  . Left ear surgery    . LIVER BIOPSY  2014   confirmed to be DLBCL  . PARATHYROIDECTOMY    . TUBAL LIGATION      Social History:  Ambulatory *** independently cane, walker  wheelchair bound, bed bound     reports that she has never smoked. She has never used smokeless tobacco. She reports current alcohol use. She reports that she does not use drugs.      Family History: *** Family History  Problem Relation Age of Onset  . Heart disease Mother   . Hypertension Mother   . Gastric cancer Father   . Renal Disease Father   . Cancer Maternal Grandmother   . Leukemia Maternal Grandfather   . Cancer Paternal Grandmother   . Cancer Paternal Grandfather    ______________________________________________________________________________________________ Allergies: Allergies  Allergen Reactions  . Other Other (See Comments)    Transparent dressing - skin tear  . Amlodipine Swelling    Peripheral edema, ankles swell   . Methotrexate Dermatitis     Prior to Admission medications   Medication Sig Start Date End Date Taking? Authorizing Provider  acyclovir (ZOVIRAX) 400 MG tablet Take 1 tablet (400 mg) by mouth once daily after 5 pm. Take while neutropenic as directed by oncologist. 11/20/21   [provider]  atorvastatin (LIPITOR) 20 MG tablet Take 20 mg by mouth daily. 04/01/21   [provider]  benzonatate (TESSALON) 100 MG capsule Take 1 capsule by mouth 3 (three) times daily as needed. 07/24/21   [provider]  cinacalcet (SENSIPAR) 30 MG tablet Take 30 mg by mouth daily.    [provider]  cyanocobalamin 1000 MCG tablet Take by mouth. 06/19/21   [provider]  dapsone 100 MG tablet Take 100 mg by mouth daily.    [provider]  dexamethasone (DECADRON) 2 MG tablet Take 3 tablets (6mg ) on 02/20/22 and 1 tablet (2mg ) on 02/21/22. 02/19/22   [provider]  diclofenac Sodium (VOLTAREN) 1 % GEL Apply topically.    [provider]  docusate sodium (COLACE) 100 MG capsule Take 1 capsule (100 mg total) by mouth 2 (two) times daily as needed for mild constipation. Patient not taking: Reported on 06/03/2021 03/22/21   Lorin Glass, MD  ENTRESTO 24-26 MG Take by mouth. 09/08/21   [provider]  gabapentin (NEURONTIN) 300 MG capsule Take 300  mg by mouth at bedtime. 05/23/21   [provider]  labetalol (NORMODYNE) 5 MG/ML injection Inject 2 mLs (10 mg total) into the vein every 2 (two) hours as needed (SBP > 180 or DBP > 100; HR must be > 70 to administer). Patient not taking: Reported on 06/03/2021 03/22/21   Lorin Glass, MD  lidocaine, PF, (XYLOCAINE) 1 % SOLN injection Inject 5 mLs into the skin as needed (topical anesthesia for hemodialysis if GEBAUERS is ineffective.). Patient not taking: Reported on 06/03/2021 03/22/21   Lorin Glass, MD  lidocaine-prilocaine (EMLA) cream Apply 1 application topically as needed (topical anesthesia for hemodialysis if Gebauers and Lidocaine injection are ineffective.). Patient not taking: Reported on 06/03/2021 03/22/21   Lorin Glass, MD  magic mouthwash w/lidocaine SOLN Take 5 mLs by mouth 3 (three) times daily as needed for mouth pain. Patient not taking: Reported on 06/03/2021 03/22/21   Lorin Glass, MD  Mouthwashes (MOUTH RINSE) LIQD solution 15 mLs by Mouth Rinse route 2 (two) times daily. Patient not taking: Reported on 06/03/2021 03/22/21   Lorin Glass, MD  ondansetron [facility]) 4 MG/2ML SOLN injection Inject 2 mLs (4 mg total) into the vein every 8 (eight) hours as needed for nausea or vomiting. Patient not taking: Reported on 06/03/2021 03/22/21   Lorin Glass, MD  pantoprazole (PROTONIX) 40 MG injection Inject 40 mg into the vein daily. Patient not taking: Reported on 06/03/2021 03/23/21   Lorin Glass, MD  pentafluoroprop-tetrafluoroeth Peggye Pitt) AERO Apply 1 application  topically as needed (topical anesthesia for hemodialysis). Patient not taking: Reported on 06/03/2021 03/22/21   Lorin Glass, MD  polyethylene glycol (MIRALAX / GLYCOLAX) 17 g packet Take 17 g by mouth daily as needed for moderate constipation. Patient not taking: Reported on 06/03/2021 03/22/21   Lorin Glass, MD  sevelamer carbonate (RENVELA) 800 MG tablet Take by mouth.    [provider]  Tbo-Filgrastim (GRANIX) 480 MCG/0.8ML SOSY injection Inject 0.8 mLs (480 mcg total) into the skin daily at 6 (six) AM. Patient not taking: Reported on 06/03/2021 03/22/21   Lorin Glass, MD  traMADol (ULTRAM) 50 MG tablet Take 50 mg by mouth 3 (three) times daily as needed.    [provider]    ___________________________________________________________________________________________________ Physical Exam:    09/07/2022    8:38 PM 09/07/2022    7:10 PM 09/07/2022    6:45 PM  Vitals with BMI  Systolic 101 171 295  Diastolic 72 108 92  Pulse 102 621 107     1. General:  in No ***Acute distress***increased work of breathing ***complaining of severe pain****agitated * Chronically ill *well *cachectic *toxic acutely ill -appearing 2. Psychological: Alert and *** Oriented 3. Head/ENT:   Moist *** Dry Mucous Membranes                          Head Non traumatic, neck supple                          Normal *** Poor Dentition 4. SKIN: normal *** decreased Skin turgor,  Skin clean Dry and intact no rash    5. Heart: Regular rate and rhythm no*** Murmur, no Rub or gallop 6. Lungs: ***Clear to auscultation bilaterally, no wheezes or crackles   7. Abdomen: Soft, ***non-tender, Non distended *** obese ***bowel sounds present 8. Lower extremities: no clubbing, cyanosis, no ***edema 9. Neurologically  Grossly intact, moving all 4 extremities equally *** strength 5 out of 5 in all 4 extremities cranial nerves II through XII intact 10. MSK: Normal range of motion    Chart has been  reviewed  ______________________________________________________________________________________________  Assessment/Plan  ***  Admitted for *** There are no diagnoses linked to this encounter.   Present on Admission: **None**     No problem-specific Assessment & Plan notes found for this encounter.    Other plan as per orders.  DVT prophylaxis:  SCD *** Lovenox       Code Status:    Code Status: Prior FULL CODE *** DNR/DNI ***comfort care as per patient ***family  I had personally discussed CODE STATUS with patient and family* I had spent *min discussing goals of care and CODE STATUS ACP has been reviewed ***   Family Communication:   Family not at  Bedside  plan of care was discussed on the phone with *** Son, Daughter, Wife, Husband, Sister, Brother , father, mother  Diet  Diet Orders (From admission, onward)     Start     Ordered   09/07/22 1714  Diet NPO time specified  Diet effective now        09/07/22 1714            Disposition Plan:   *** likely will need placement for rehabilitation                          Back to current facility when stable                            To home once workup is complete and patient is stable  ***Following barriers for discharge:                            Electrolytes corrected                               Anemia corrected                             Pain controlled with PO medications                               Afebrile, white count improving able to transition to PO antibiotics                             Will need to be able to tolerate PO                            Will likely need home health, home O2, set up                           Will need consultants to evaluate patient prior to discharge  ****EXPECT DC tomorrow       Consult Orders  (From admission, onward)           Start     Ordered   09/07/22 2043  Consult to hospitalist  PG SENT BY DELORIS  Once  Provider:  (Not yet assigned)   Question Answer Comment  Place call to: [facility]   Reason for Consult Admit      09/07/22 2042                              ***Would benefit from PT/OT eval prior to DC  Ordered                   Swallow eval - SLP ordered                   Diabetes care coordinator                   Transition of care consulted                   Nutrition    consulted                  Wound care  consulted                   Palliative care    consulted                   Behavioral health  consulted                    Consults called: ***     Admission status:  ED Disposition     None        Obs***  ***  inpatient     I Expect 2 midnight stay secondary to severity of patient's current illness need for inpatient interventions justified by the following: ***hemodynamic instability despite optimal treatment (tachycardia *hypotension * tachypnea *hypoxia, hypercapnia) * Severe lab/radiological/exam abnormalities including:     and extensive comorbidities including: *substance abuse  *Chronic pain *DM2  * CHF * CAD  * COPD/asthma *Morbid Obesity * CKD *dementia *liver disease *history of stroke with residual deficits *  malignancy, * sickle cell disease  History of amputation Chronic anticoagulation  That are currently affecting medical management.   I expect  patient to be hospitalized for 2 midnights requiring inpatient medical care.  Patient is at high risk for adverse outcome (such as loss of life or disability) if not treated.  Indication for inpatient stay as follows:  Severe change from baseline regarding mental status Hemodynamic instability despite maximal medical therapy,  ongoing suicidal ideations,  severe pain requiring acute inpatient management,  inability to maintain oral hydration   persistent chest pain despite medical management Need for operative/procedural  intervention New or worsening hypoxia   Need for IV antibiotics, IV fluids,  IV rate controling medications, IV antihypertensives, IV pain medications, IV anticoagulation, need for biPAP    Level of care   *** tele  For 12H 24H     medical floor       progressive tele indefinitely please discontinue once patient no longer qualifies COVID-19 Labs    Lab Results  Component Value Date   SARSCOV2NAA NEGATIVE 09/07/2022     Precautions: admitted as *** Covid Negative  ***asymptomatic screening protocol****PUI *** covid positive No active isolations ***If Covid PCR is negative  - please DC precautions - would need additional investigation given very high risk for false native test result    Critical***  Patient is critically ill due to  hemodynamic instability * respiratory failure *severe sepsis* ongoing chest pain*  They  are at high risk for life/limb threatening clinical deterioration requiring frequent reassessment and modifications of care.  Services provided include examination of the patient, review of relevant ancillary tests, prescription of lifesaving therapies, review of medications and prophylactic therapy.  Total critical care time excluding separately billable procedures: 60*  Minutes.    Keavon Sensing 09/07/2022, 9:06 PM ***  [facility]     after 2 AM please page floor coverage PA If 7AM-7PM, please contact the day team taking care of the patient using [email or web address]

## 2022-09-08 LAB — CULTURE, BLOOD (ROUTINE X 2)

## 2022-09-08 LAB — CBG MONITORING, ED
Glucose-Capillary: 76 mg/dL (ref 70–99)
Glucose-Capillary: 86 mg/dL (ref 70–99)

## 2022-09-08 LAB — SODIUM, URINE, RANDOM: Sodium, Ur: 80 mmol/L

## 2022-09-08 LAB — OSMOLALITY, URINE: Osmolality, Ur: 254 mOsm/kg — ABNORMAL LOW (ref 300–900)

## 2022-09-08 LAB — CREATININE, URINE, RANDOM: Creatinine, Urine: 38 mg/dL

## 2022-09-08 MED ORDER — DEXAMETHASONE SODIUM PHOSPHATE 10 MG/ML IJ SOLN
10.0000 mg | Freq: Once | INTRAMUSCULAR | Status: AC
  Administered 2022-09-08: 10 mg via INTRAVENOUS
  Filled 2022-09-08: qty 1

## 2022-09-08 NOTE — ED Notes (Signed)
Report given to [facility] transport on patient's condition / plan of care .

## 2022-09-08 NOTE — ED Notes (Addendum)
Set up transfer via [facility]

## 2022-09-08 NOTE — ED Notes (Signed)
[facility] will transport patient .

## 2022-09-08 NOTE — ED Notes (Signed)
Report given to [facility] nurse Celesta Gentile RN on patient's transfer , [facility] notified by [employer] operational officer for transport .

## 2022-09-08 NOTE — ED Notes (Signed)
ED TO INPATIENT HANDOFF REPORT  ED Nurse Name and Phone #:  Lucious Groves [phone]  S Name/Age/Gender Tracey Stevens 62 y.o. female Room/Bed: RESUSC/RESUSC  Code Status   Code Status: Prior  Home/SNF/Other Home Patient oriented to: self Is this baseline? No   Triage Complete: Triage complete  Chief Complaint Code STROKE  Triage Note Pt BIB EMS due to a code stroke. LSN 1545. Sx started at 1630. Pt was at dialysis and stopped tx 45 min early. Pt having left sided weakness and left sided facial droop. Pt usually axox4 and wheelchair bound.    Allergies Allergies  Allergen Reactions   Other Other (See Comments)    Transparent dressing - skin tear   Amlodipine Swelling    Peripheral edema, ankles swell    Methotrexate Dermatitis    Level of Care/Admitting Diagnosis ED Disposition     ED Disposition  Transfer via Transport   Condition  --   Comment  The patient appears reasonably stabilized for transfer considering the current resources, flow, and capabilities available in the ED at this time, and I doubt any other [facility] requiring further screening and/or treatment in the ED prior to transfer is p resent.          B Medical/Surgery History Past Medical History:  Diagnosis Date   Acute lymphoblastic leukemia (ALL) (HCC)    Adrenal nodule (HCC)    Anemia    Cancer (HCC)    cervical   Congestive heart failure (CHF) (HCC)    Diffuse Large B Cell Lymphoma 2015 remission   Dyspnea    SOB when she over exerts herself   ESRD on dialysis [facility])    M-W-F   Hearing loss    History of blood transfusion    Hyperparathyroidism (HCC)    s/p parathyroidectomy   Hypertension    Hypertension    Multinodular goiter    Multiple renal cysts    Nephrolithiasis    On antineoplastic chemotherapy    Pre-diabetes    Most recent A1C 6.7- 09/13/20. No blood sugar checks   Vertigo    Vitamin D deficiency    Past Surgical History:  Procedure Laterality Date   ABDOMINAL  HYSTERECTOMY     BREAST BIOPSY Right over 10 years ago   benign   Left ear surgery     LIVER BIOPSY  2014   confirmed to be DLBCL   PARATHYROIDECTOMY     TUBAL LIGATION       A IV Location/Drains/Wounds Patient Lines/Drains/Airways Status     Active Line/Drains/Airways     Name Placement date Placement time Site Days   Implanted Port 10/14/20 Right Chest 10/14/20  --  Chest  694   Peripheral IV 09/07/22 20 G Anterior;Proximal;Right Forearm 09/07/22  --  Forearm  1   Peripheral IV 09/07/22 18 G Left Antecubital 09/07/22  1855  Antecubital  1            Intake/Output Last 24 hours No intake or output data in the 24 hours ending 09/08/22 0203  Labs/Imaging Results for orders placed or performed during the hospital encounter of 09/07/22 (from the past 48 hour(s))  Protime-INR     Status: None   Collection Time: 09/07/22  3:17 PM  Result Value Ref Range   Prothrombin Time 14.5 11.4 - 15.2 seconds   INR 1.1 0.8 - 1.2    Comment: (NOTE) INR goal varies based on device and disease states. Performed at [facility], [address] [place], [place] [address]   APTT     Status: None   Collection Time: 09/07/22  3:17 PM  Result Value Ref Range   aPTT 32 24 - 36 seconds    Comment: Performed at [facility], [address], [place], [place] [address]  CBC     Status: Abnormal   Collection Time: 09/07/22  3:17 PM  Result Value Ref Range   WBC 5.8 4.0 - 10.5 K/uL   RBC 2.89 (L) 3.87 - 5.11 MIL/uL   Hemoglobin 9.5 (L) 12.0 - 15.0 g/dL   HCT 96.2 (L) 95.2 - 84.1 %   MCV 106.6 (H) 80.0 - 100.0 fL   MCH 32.9 26.0 - 34.0 pg   MCHC 30.8 30.0 - 36.0 g/dL   RDW 32.4 40.1 - 02.7 %   Platelets 189 150 - 400 K/uL   nRBC 0.0 0.0 - 0.2 %    Comment: Performed at [facility], [address] [place], [place] [address]  Differential     Status: Abnormal   Collection Time: 09/07/22  3:17 PM  Result Value Ref Range   Neutrophils Relative % 71 %   Neutro Abs 4.1  1.7 - 7.7 K/uL   Lymphocytes Relative 10 %   Lymphs Abs 0.6 (L) 0.7 - 4.0 K/uL   Monocytes Relative 17 %   Monocytes Absolute 1.0 0.1 - 1.0 K/uL   Eosinophils Relative 1 %   Eosinophils Absolute 0.0 0.0 - 0.5 K/uL   Basophils Relative 0 %   Basophils Absolute 0.0 0.0 - 0.1 K/uL   Immature Granulocytes 1 %   Abs Immature Granulocytes 0.03 0.00 - 0.07 K/uL    Comment: Performed at [facility], [address], [place], [place] [address]  Comprehensive metabolic panel     Status: Abnormal   Collection Time: 09/07/22  3:17 PM  Result Value Ref Range   Sodium 137 135 - 145 mmol/L   Potassium 3.0 (L) 3.5 - 5.1 mmol/L   Chloride 97 (L) 98 - 111 mmol/L   CO2 27 22 - 32 mmol/L   Glucose, Bld 86 70 - 99 mg/dL    Comment: Glucose reference range applies only to samples taken after fasting for at least 8 hours.   BUN 17 8 - 23 mg/dL   Creatinine, Ser 7.42 (H) 0.44 - 1.00 mg/dL   Calcium 8.1 (L) 8.9 - 10.3 mg/dL   Total Protein 5.2 (L) 6.5 - 8.1 g/dL   Albumin 3.1 (L) 3.5 - 5.0 g/dL   AST 10 (L) 15 - 41 U/L   ALT 8 0 - 44 U/L   Alkaline Phosphatase 111 38 - 126 U/L   Total Bilirubin 1.0 0.3 - 1.2 mg/dL   GFR, Estimated 8 (L) >60 mL/min    Comment: (NOTE) Calculated using the CKD-EPI Creatinine Equation (2021)    Anion gap 13 5 - 15    Comment: Performed at [facility], [address], [place], [place] [address]  Ethanol     Status: None   Collection Time: 09/07/22  3:17 PM  Result Value Ref Range   Alcohol, Ethyl (B) <10 <10 mg/dL    Comment: (NOTE) Lowest detectable limit for serum alcohol is 10 mg/dL.  For medical purposes only. Performed at [facility], [address], [place], [place] [address]   TSH     Status: None   Collection Time: 09/07/22  3:17 PM  Result Value Ref  Range   TSH 1.413 0.350 - 4.500 uIU/mL    Comment: Performed by a 3rd Generation assay with a functional sensitivity of <=0.01 uIU/mL. Performed at [facility], [address] [place], [place] [address]   CBG monitoring, ED     Status: None   Collection Time: 09/07/22  5:15 PM  Result Value Ref Range   Glucose-Capillary 84 70 - 99 mg/dL    Comment: Glucose reference range applies only to samples taken after fasting for at least 8 hours.   Comment 1 Notify RN    Comment 2 Document in Chart   I-stat chem 8, ED     Status: Abnormal   Collection Time: 09/07/22  5:20 PM  Result Value Ref Range   Sodium 139 135 - 145 mmol/L   Potassium 3.1 (L) 3.5 - 5.1 mmol/L   Chloride 97 (L) 98 - 111 mmol/L   BUN 17 8 - 23 mg/dL   Creatinine, Ser 9.52 (H) 0.44 - 1.00 mg/dL   Glucose, Bld 85 70 - 99 mg/dL    Comment: Glucose reference range applies only to samples taken after fasting for at least 8 hours.   Calcium, Ion 0.97 (L) 1.15 - 1.40 mmol/L   TCO2 29 22 - 32 mmol/L   Hemoglobin 10.2 (L) 12.0 - 15.0 g/dL   HCT 84.1 (L) 32.4 - 40.1 %  Resp panel by RT-PCR (RSV, Flu A&B, Covid) Anterior Nasal Swab     Status: None   Collection Time: 09/07/22  5:47 PM   Specimen: Anterior Nasal Swab  Result Value Ref Range   SARS Coronavirus 2 by RT PCR NEGATIVE NEGATIVE   Influenza A by PCR NEGATIVE NEGATIVE   Influenza B by PCR NEGATIVE NEGATIVE    Comment: (NOTE) The Xpert Xpress SARS-CoV-2/FLU/RSV plus assay is intended as an aid in the diagnosis of influenza from Nasopharyngeal swab specimens and should not be used as a sole basis for treatment. Nasal washings and aspirates are unacceptable for Xpert Xpress SARS-CoV-2/FLU/RSV testing.  Fact Sheet for Patients: BloggerCourse.com  Fact Sheet for Healthcare Providers: SeriousBroker.it  This test is not yet approved or cleared by the [place] FDA and has been authorized for detection and/or diagnosis of SARS-CoV-2 by FDA under an Emergency Use Authorization (EUA). This EUA will remain in effect (meaning this test can be used) for the duration of the COVID-19 declaration  under Section 564(b)(1) of the Act, 21 U.S.C. section 360bbb-3(b)(1), unless the authorization is terminated or revoked.     Resp Syncytial Virus by PCR NEGATIVE NEGATIVE    Comment: (NOTE) Fact Sheet for Patients: BloggerCourse.com  Fact Sheet for Healthcare Providers: SeriousBroker.it  This test is not yet approved or cleared by the [place] FDA and has been authorized for detection and/or diagnosis of SARS-CoV-2 by FDA under an Emergency Use Authorization (EUA). This EUA will remain in effect (meaning this test can be used) for the duration of the COVID-19 declaration under Section 564(b)(1) of the Act, 21 U.S.C. section 360bbb-3(b)(1), unless the authorization is terminated or revoked.  Performed at [facility], [address], [place], [place] [address]   Lactic acid, plasma     Status: None   Collection Time: 09/07/22  6:12 PM  Result Value Ref Range   Lactic Acid, Venous 1.4 0.5 - 1.9 mmol/L    Comment: Performed at [facility], [address], [place], [place] [address]  Lactic acid, plasma     Status:  None   Collection Time: 09/07/22  8:20 PM  Result Value Ref Range   Lactic Acid, Venous 1.5 0.5 - 1.9 mmol/L    Comment: Performed at [facility], [address] [place], [place] [address]  I-Stat venous blood gas, ED     Status: Abnormal   Collection Time: 09/07/22  8:28 PM  Result Value Ref Range   pH, Ven 7.558 (H) 7.25 - 7.43   pCO2, Ven 33.9 (L) 44 - 60 mmHg   pO2, Ven 29 (LL) 32 - 45 mmHg   Bicarbonate 30.2 (H) 20.0 - 28.0 mmol/L   TCO2 31 22 - 32 mmol/L   O2 Saturation 65 %   Acid-Base Excess 8.0 (H) 0.0 - 2.0 mmol/L   Sodium 138 135 - 145 mmol/L   Potassium 3.3 (L) 3.5 - 5.1 mmol/L   Calcium, Ion 0.96 (L) 1.15 - 1.40 mmol/L   HCT 28.0 (L) 36.0 - 46.0 %   Hemoglobin 9.5 (L) 12.0 - 15.0 g/dL   Sample type VENOUS    Comment NOTIFIED PHYSICIAN   Magnesium     Status: Abnormal    Collection Time: 09/07/22  9:30 PM  Result Value Ref Range   Magnesium 1.6 (L) 1.7 - 2.4 mg/dL    Comment: Performed at [facility], [address] [place], [place] [address]  Phosphorus     Status: None   Collection Time: 09/07/22  9:30 PM  Result Value Ref Range   Phosphorus 3.1 2.5 - 4.6 mg/dL    Comment: Performed at [facility], [address], [place], [place] [address]  Osmolality     Status: Abnormal   Collection Time: 09/07/22  9:30 PM  Result Value Ref Range   Osmolality 298 (H) 275 - 295 mOsm/kg    Comment: Performed at [facility], [address] [place], [place] [address]  Ammonia     Status: None   Collection Time: 09/07/22  9:30 PM  Result Value Ref Range   Ammonia 13 9 - 35 umol/L    Comment: Performed at [facility], [address], [place], [place] [address]  Creatinine, urine, random     Status: None   Collection Time: 09/07/22 11:33 PM  Result Value Ref Range   Creatinine, Urine 38 mg/dL    Comment: Performed at [facility], [address], [place], [place] [address]  Osmolality, urine     Status: Abnormal   Collection Time: 09/07/22 11:33 PM  Result Value Ref Range   Osmolality, Ur 254 (L) 300 - 900 mOsm/kg    Comment: Performed at [facility], [address], [place], [place] [address]  Sodium, urine, random     Status: None   Collection Time: 09/07/22 11:33 PM  Result Value Ref Range   Sodium, Ur 80 mmol/L    Comment: Performed at [facility], [address] [place], [place] [address]  Urinalysis, Complete w Microscopic -Urine, Clean Catch     Status: Abnormal   Collection Time: 09/07/22 11:33 PM  Result Value Ref Range   Color, Urine YELLOW YELLOW   APPearance CLEAR CLEAR   Specific Gravity, Urine 1.016 1.005 - 1.030   pH 9.0 (H) 5.0 - 8.0   Glucose, UA 50 (A) NEGATIVE mg/dL   Hgb urine dipstick NEGATIVE NEGATIVE   Bilirubin Urine NEGATIVE NEGATIVE   Ketones, ur NEGATIVE NEGATIVE mg/dL   Protein,  ur 284 (A) NEGATIVE  mg/dL   Nitrite NEGATIVE NEGATIVE   Leukocytes,Ua SMALL (A) NEGATIVE   RBC / HPF 6-10 0 - 5 RBC/hpf   WBC, UA 11-20 0 - 5 WBC/hpf   Bacteria, UA FEW (A) NONE SEEN   Squamous Epithelial / HPF 0-5 0 - 5 /HPF    Comment: Performed at [facility], [address] [place], [place] [address]   CT Angio Chest PE W and/or Wo Contrast  Result Date: 09/07/2022 CLINICAL DATA:  Presented from dialysis center to ED with altered mental status, obtained CTA head and neck with no LV O or significant stenotic lesions. No core infarction seen on CT perfusion study. Pulmonary embolism suspected. High probability.Patient shaking during study with limited image quality as a result. EXAM: CT ANGIOGRAPHY CHEST WITH CONTRAST TECHNIQUE: Multidetector CT imaging of the chest was performed using the standard protocol during bolus administration of intravenous contrast. Multiplanar CT image reconstructions and MIPs were obtained to evaluate the vascular anatomy. RADIATION DOSE REDUCTION: This exam was performed according to the departmental dose-optimization program which includes automated exposure control, adjustment of the mA and/or kV according to patient size and/or use of iterative reconstruction technique. CONTRAST:  50mL OMNIPAQUE IOHEXOL 350 MG/ML SOLN COMPARISON:  Portable chest today, portable chest 08/10/2022, AP Lat chest 08/06/2021, chest CT without contrast 12/11/2020. FINDINGS: Cardiovascular: There is a right chest port with IJ approach catheter terminating just above the superior cavoatrial junction, left IJ dialysis catheter with the tip just below the superior cavoatrial junction. There is abundant patient motion on exam. The heart is slightly enlarged. There is no pericardial effusion. There are no visible coronary artery calcifications. There are small amounts of scattered soft plaque in the descending thoracic aorta with tortuous aorta and no appreciable calcific plaques, aneurysm,  stenosis or dissection. The great vessels are clear. The pulmonary trunk is upper normal in caliber. The pulmonary veins are normal caliber. Pulmonary arteries show no embolic filling defects at least through the main lobar arteries. The segmental and subsegmental arteries are obscured due to patient motion and not evaluated. Mediastinum/Nodes: There is a 1.8 cm heterogeneous nodule in the posterior aspect of the right lobe of the thyroid gland. Nonemergent ultrasound follow-up is recommended. This may have been present previously but not appreciated without IV contrast. There is no intrathoracic or axillary adenopathy. The thoracic esophagus, thoracic trachea and main bronchi are unremarkable. Lungs/Pleura: Eventration and mild elevation left hemidiaphragm. There is no pleural effusion, thickening or pneumothorax. There is a low inspiration on exam. There are increased bands of atelectasis in the lower lobes. No focal pneumonia is evident. There is a 10 mm noncalcified right lower lobe nodule on 6:101 which is unchanged in size since 12/11/2020. No other nodules are seen. There is no significant bronchial thickening. Upper Abdomen: 6.1 cm lobulated cyst again noted in the superior pole left kidney, similar size as previously with prior renal ultrasound 12/12/2020 noting a simple cyst in this location. Both kidneys are atrophic consistent with end-stage renal failure. There is a 3 mm nonobstructive stone in the inferior pole left kidney. Small cysts are noted on the right with both kidneys having undergone significant interval volume loss since 2022. Adenomatous hyperplasia of both adrenal glands is also again noted. There are no acute upper abdominal imaging findings. Musculoskeletal: There is a chronic moderate to severe compression fracture with 3 mm retropulsion at T12. There is advanced degenerative disc disease with spondylosis in the thoracic spine with mild thoracic kyphodextroscoliosis. No focal chest  wall  abnormality. Review of the MIP images confirms the above findings. IMPRESSION: 1. No arterial embolus is seen at least through the main lobar arteries. The segmental and subsegmental arteries are obscured due to patient motion. 2. Slight cardiomegaly with borderline prominence of the pulmonary trunk. No venous dilatation or edema. 3. Low inspiration with increased bands of atelectasis in the lower lobes. No focal pneumonia. 4. 1.8 cm heterogeneous nodule in the right lobe of the thyroid gland. Nonemergent ultrasound follow-up is recommended. 5. 10 mm noncalcified right lower lobe nodule, unchanged since 12/11/2020. Follow-up chest CT recommended either in late September or in October of 2024 to confirm 2 years of stability. 6. Minimal aortic noncalcified atherosclerosis. 7. Atrophic kidneys consistent with end-stage renal failure. 8. Chronic moderate to severe T12 compression fracture with 3 mm retropulsion. 9. Nonobstructive 3 mm stone in the inferior pole left kidney. 10. Chronic adenomatous hyperplasia of both adrenal glands. 11. Additional findings described above. Aortic Atherosclerosis (ICD10-I70.0). Electronically Signed   By: Almira Bar M.D.   On: 09/07/2022 20:25   DG Foot Complete Right  Result Date: 09/07/2022 CLINICAL DATA:  AMS swollen foot of uncertain etiology EXAM: RIGHT FOOT COMPLETE - 3+ VIEW COMPARISON:  None Available. FINDINGS: Osteopenia. Age indeterminate fracture of the medial malleolus joint spaces and alignment are maintained. No area of erosion or osseous destruction. No unexpected radiopaque foreign body. Diffuse soft tissue edema. IMPRESSION: Age indeterminate fracture of the medial malleolus. Recommend dedicated ankle radiographs when clinically appropriate. Electronically Signed   By: Meda Klinefelter M.D.   On: 09/07/2022 18:47   DG Chest Port 1 View  Result Date: 09/07/2022 CLINICAL DATA:  Altered mental status. EXAM: PORTABLE CHEST 1 VIEW COMPARISON:  Chest radiograph  dated 08/10/2022. FINDINGS: Support apparatus in similar position. No focal consolidation, pleural effusion, or pneumothorax. Mild cardiomegaly. Atherosclerotic calcification of the aortic arch. No acute osseous pathology. IMPRESSION: 1. No acute cardiopulmonary process. 2. Mild cardiomegaly. Electronically Signed   By: Elgie Collard M.D.   On: 09/07/2022 18:31   CT ANGIO HEAD NECK W WO CM W PERF (CODE STROKE)  Result Date: 09/07/2022 CLINICAL DATA:  Neuro deficit, acute, stroke suspected. EXAM: CT ANGIOGRAPHY HEAD AND NECK CT PERFUSION BRAIN TECHNIQUE: Multidetector CT imaging of the head and neck was performed using the standard protocol during bolus administration of intravenous contrast. Multiplanar CT image reconstructions and MIPs were obtained to evaluate the vascular anatomy. Carotid stenosis measurements (when applicable) are obtained utilizing NASCET criteria, using the distal internal carotid diameter as the denominator. Multiphase CT imaging of the brain was performed following IV bolus contrast injection. Subsequent parametric perfusion maps were calculated using RAPID software. RADIATION DOSE REDUCTION: This exam was performed according to the departmental dose-optimization program which includes automated exposure control, adjustment of the mA and/or kV according to patient size and/or use of iterative reconstruction technique. CONTRAST:  OMNIPAQUE IOHEXOL 350 MG/ML SOLN COMPARISON:  Head CT immediately prior. FINDINGS: CTA NECK FINDINGS Aortic arch: Branching pattern shows the left vertebral artery arising directly from the arch. No origin stenosis. Right carotid system: Common carotid artery widely patent to the bifurcation. Carotid bifurcation is normal without soft or calcified plaque. Cervical ICA is somewhat tortuous but widely patent. Left carotid system: Left common carotid artery widely patent to the bifurcation. Minimal calcified plaque in the ICA bulb but no stenosis. Cervical  ICA patent beyond that. Vertebral arteries: Both vertebral artery origins are patent. As noted above, the left vertebral artery arises directly from  the arch. Both vessels are widely patent through the cervical region to the foramen magnum. Skeleton: Ordinary cervical spondylosis. Other neck: No lymphadenopathy. Few small thyroid nodules. The largest on the right measures 9 mm. No follow-up recommended. Upper chest: Lung apices are clear. Review of the MIP images confirms the above findings CTA HEAD FINDINGS Anterior circulation: Both internal carotid arteries are patent through the skull base and siphon regions. No siphon stenosis. The anterior and middle cerebral vessels are patent. Both anterior cerebral arteries receive there supply from the left carotid circulation. No large vessel occlusion or proximal stenosis. No aneurysm or vascular malformation. Posterior circulation: Both vertebral arteries are patent through the foramen magnum to the basilar artery. No basilar stenosis. The basilar artery is a small vessel because of fetal origin of both posterior cerebral arteries. No branch vessel occlusion is seen. Venous sinuses: Patent and normal. Anatomic variants: None significant. Review of the MIP images confirms the above findings CT Brain Perfusion Findings: ASPECTS: 10 CBF (<30%) Volume: 0mL Perfusion (Tmax>6.0s) volume: 26mL. I believe this to be artifactual, related to a degree of motion and the VP shunt. Mismatch Volume: 26mL calculated. As above, I believe this is artifactual. Infarction Location:None IMPRESSION: 1. No large vessel occlusion. No stenosis of the carotid bifurcations. 2. No intracranial large or medium vessel occlusion. Fetal origin of both posterior cerebral arteries. 3. CT perfusion study shows no evidence of core infarction. 26 mL of apparent ischemia is thought to be artifactual. Electronically Signed   By: Paulina Fusi M.D.   On: 09/07/2022 17:48   CT HEAD CODE STROKE WO  CONTRAST  Result Date: 09/07/2022 CLINICAL DATA:  Code stroke. Neuro deficit, acute, stroke suspected. EXAM: CT HEAD WITHOUT CONTRAST TECHNIQUE: Contiguous axial images were obtained from the base of the skull through the vertex without intravenous contrast. RADIATION DOSE REDUCTION: This exam was performed according to the departmental dose-optimization program which includes automated exposure control, adjustment of the mA and/or kV according to patient size and/or use of iterative reconstruction technique. COMPARISON:  CT head without contrast 08/10/2022 FINDINGS: Brain: No acute infarct, hemorrhage, or mass lesion is present. Right frontal ventriculostomy catheter is stable. No significant white matter lesions are present. Deep brain nuclei are within normal limits. The ventricles are of normal size. No significant extraaxial fluid collection is present. The brainstem and cerebellum are within normal limits. Midline structures are within normal limits. Vascular: No hyperdense vessel or unexpected calcification. Skull: No significant extracranial soft tissue lesion is present. Calvarium is intact apart from the burr hole for the ventriculostomy catheter. No focal lytic or blastic lesions are present. Sinuses/Orbits: The paranasal sinuses and mastoid air cells are clear. The globes and orbits are within normal limits. ASPECTS [facility] Stroke Program Early CT Score) - Ganglionic level infarction (caudate, lentiform nuclei, internal capsule, insula, M1-M3 cortex): 7/7 - Supraganglionic infarction (M4-M6 cortex): 3/3 Total score (0-10 with 10 being normal): 10/10 IMPRESSION: 1. No acute intracranial abnormality or significant interval change. 2. Stable right frontal ventriculostomy catheter. 3. Aspects is 10/10. The above was relayed via text pager to Dr. Amada Jupiter on 09/07/2022 at 17:28 . Electronically Signed   By: Marin Roberts M.D.   On: 09/07/2022 17:29    Pending Labs Unresulted Labs (From  admission, onward)     Start     Ordered   09/08/22 0500  Prealbumin  Tomorrow morning,   R        09/07/22 2116   09/07/22 1747  Blood Culture (routine  x 2)  (Septic presentation on arrival (screening labs, nursing and treatment orders for obvious sepsis))  BLOOD CULTURE X 2,   STAT      09/07/22 1747   09/07/22 1747  Urinalysis, w/ Reflex to Culture (Infection Suspected) -Urine, Catheterized  (Septic presentation on arrival (screening labs, nursing and treatment orders for obvious sepsis))  Once,   URGENT       Question:  Specimen Source  Answer:  Urine, Catheterized   09/07/22 1747            Vitals/Pain Today's Vitals   09/08/22 0041 09/08/22 0045 09/08/22 0105 09/08/22 0115  BP:  119/67  (!) 140/75  Pulse:  (!) 113  (!) 103  Resp:  15  17  Temp: 98.4 F (36.9 C)     TempSrc: Temporal     SpO2:  96%  98%  Weight:      PainSc:   0-No pain     Isolation Precautions No active isolations  Medications Medications  vancomycin variable dose per unstable renal function (pharmacist dosing) (has no administration in time range)  ceFEPIme (MAXIPIME) 1 g in sodium chloride 0.9 % 100 mL IVPB (has no administration in time range)  sodium chloride flush (NS) 0.9 % injection 3 mL (3 mLs Intravenous Given 09/07/22 1850)  iohexol (OMNIPAQUE) 350 MG/ML injection 100 mL (100 mLs Intravenous Contrast Given 09/07/22 1739)  ceFEPIme (MAXIPIME) 2 g in sodium chloride 0.9 % 100 mL IVPB (0 g Intravenous Stopped 09/07/22 2005)  metroNIDAZOLE (FLAGYL) IVPB 500 mg (0 mg Intravenous Stopped 09/07/22 2005)  vancomycin (VANCOREADY) IVPB 2000 mg/400 mL (0 mg Intravenous Stopped 09/07/22 2111)  iohexol (OMNIPAQUE) 350 MG/ML injection 50 mL (50 mLs Intravenous Contrast Given 09/07/22 1938)  diazepam (VALIUM) injection 5 mg (5 mg Intravenous Given 09/07/22 2043)    Mobility walks     Focused Assessments     R Recommendations: See Admitting Provider Note  Report given to:   Additional Notes: 2

## 2022-09-09 LAB — CULTURE, BLOOD (ROUTINE X 2)

## 2022-09-10 LAB — CULTURE, BLOOD (ROUTINE X 2)

## 2022-09-11 LAB — CULTURE, BLOOD (ROUTINE X 2)

## 2022-09-12 LAB — CULTURE, BLOOD (ROUTINE X 2)
Culture: NO GROWTH
Culture: NO GROWTH

## 2022-09-13 ENCOUNTER — Emergency Department (HOSPITAL_COMMUNITY)
Admission: EM | Admit: 2022-09-13 | Discharge: 2022-09-13 | Disposition: A | Discharge status: Home / Self Care | Payer: Medicare HMO | Attending: Emergency Medicine | Admitting: Emergency Medicine

## 2022-09-13 ENCOUNTER — Encounter (HOSPITAL_COMMUNITY): Payer: Self-pay

## 2022-09-13 ENCOUNTER — Emergency Department (HOSPITAL_COMMUNITY): Payer: Medicare HMO

## 2022-09-13 ENCOUNTER — Emergency Department (HOSPITAL_BASED_OUTPATIENT_CLINIC_OR_DEPARTMENT_OTHER): Payer: Medicare HMO

## 2022-09-13 ENCOUNTER — Other Ambulatory Visit: Payer: Self-pay

## 2022-09-13 DIAGNOSIS — X58XXXA Exposure to other specified factors, initial encounter: Secondary | ICD-10-CM | POA: Diagnosis not present

## 2022-09-13 DIAGNOSIS — M79662 Pain in left lower leg: Secondary | ICD-10-CM | POA: Diagnosis present

## 2022-09-13 DIAGNOSIS — E1122 Type 2 diabetes mellitus with diabetic chronic kidney disease: Secondary | ICD-10-CM | POA: Diagnosis not present

## 2022-09-13 DIAGNOSIS — S8254XA Nondisplaced fracture of medial malleolus of right tibia, initial encounter for closed fracture: Secondary | ICD-10-CM | POA: Insufficient documentation

## 2022-09-13 DIAGNOSIS — N186 End stage renal disease: Secondary | ICD-10-CM | POA: Diagnosis not present

## 2022-09-13 DIAGNOSIS — M7989 Other specified soft tissue disorders: Secondary | ICD-10-CM

## 2022-09-13 DIAGNOSIS — Z992 Dependence on renal dialysis: Secondary | ICD-10-CM | POA: Insufficient documentation

## 2022-09-13 LAB — CBC WITH DIFFERENTIAL/PLATELET
Abs Immature Granulocytes: 0.08 10*3/uL — ABNORMAL HIGH (ref 0.00–0.07)
Basophils Absolute: 0 10*3/uL (ref 0.0–0.1)
Basophils Relative: 0 %
Eosinophils Absolute: 0.1 10*3/uL (ref 0.0–0.5)
Eosinophils Relative: 1 %
HCT: 29.5 % — ABNORMAL LOW (ref 36.0–46.0)
Hemoglobin: 9.1 g/dL — ABNORMAL LOW (ref 12.0–15.0)
Immature Granulocytes: 1 %
Lymphocytes Relative: 10 %
Lymphs Abs: 0.8 10*3/uL (ref 0.7–4.0)
MCH: 33.2 pg (ref 26.0–34.0)
MCHC: 30.8 g/dL (ref 30.0–36.0)
MCV: 107.7 fL — ABNORMAL HIGH (ref 80.0–100.0)
Monocytes Absolute: 2.1 10*3/uL — ABNORMAL HIGH (ref 0.1–1.0)
Monocytes Relative: 27 %
Neutro Abs: 4.9 10*3/uL (ref 1.7–7.7)
Neutrophils Relative %: 61 %
Platelets: 146 10*3/uL — ABNORMAL LOW (ref 150–400)
RBC: 2.74 MIL/uL — ABNORMAL LOW (ref 3.87–5.11)
RDW: 14.6 % (ref 11.5–15.5)
WBC: 8 10*3/uL (ref 4.0–10.5)
nRBC: 0 % (ref 0.0–0.2)

## 2022-09-13 LAB — BASIC METABOLIC PANEL
Anion gap: 11 (ref 5–15)
BUN: 41 mg/dL — ABNORMAL HIGH (ref 8–23)
CO2: 25 mmol/L (ref 22–32)
Calcium: 10 mg/dL (ref 8.9–10.3)
Chloride: 105 mmol/L (ref 98–111)
Creatinine, Ser: 9.15 mg/dL — ABNORMAL HIGH (ref 0.44–1.00)
GFR, Estimated: 4 mL/min — ABNORMAL LOW (ref >60)
Glucose, Bld: 88 mg/dL (ref 70–99)
Potassium: 3.8 mmol/L (ref 3.5–5.1)
Sodium: 141 mmol/L (ref 135–145)

## 2022-09-13 NOTE — Evaluation (Addendum)
Physical Therapy Evaluation Patient Details Name: Tracey Stevens MRN: [id number] DOB: 12-05-1960 Today's Date: 09/13/2022  History of Present Illness  62 yo female admitted with R LE weakness. Pt reports she was in her shower at home and couldn't get out. [facility] Hx of recent R medial malleolus fx, R knee effusion, ALL, anemia, CHF, cervica Ca,lymphoma, ESRD, vertigo, DM  Clinical Impression  On eval in ED, pt was mostly Mod Ind with mobility except for sit to supine-she needed Min A to get R LE onto bed and assist to don/doff CAM boot. Pt does exhibit bilateral LE general weakness with R side weaker than L. She tolerated activity well. Discussed d/c plan-she is not interested in returning to [facility] SNF. She is planning to return home after d/c. Will need to maximize home health services (HHPT, HHOT, Home Health Aide) to ensure safe transition back into home environment. Would also recommend OT eval prior to d/c for ADL education.  *Addendum: Secure chat sent to OT on staff today for OT eval prior to patient's d/c.      Recommendations for follow up therapy are one component of a multi-disciplinary discharge planning process, led by the attending physician.  Recommendations may be updated based on patient status, additional functional criteria and insurance authorization.  Follow Up Recommendations       Assistance Recommended at Discharge Intermittent Supervision/Assistance  Patient can return home with the following  Assist for transportation;Assistance with cooking/housework;Help with stairs or ramp for entrance    Equipment Recommendations None recommended by PT  Recommendations for Other Services  OT consult    Functional Status Assessment Patient has had a recent decline in their functional status and demonstrates the ability to make significant improvements in function in a reasonable and predictable amount of time.     Precautions / Restrictions  Precautions Precautions: Fall Restrictions Weight Bearing Restrictions: Yes RLE Weight Bearing: Weight bearing as tolerated (with CAM boot)      Mobility  Bed Mobility Overal bed mobility: Needs Assistance Bed Mobility: Supine to Sit, Sit to Supine     Supine to sit: Modified independent (Device/Increase time) Sit to supine: Min assist   General bed mobility comments: Assist for R LE onto bed. Pt had to use UE to assist R LE. Increased time.    Transfers Overall transfer level: Needs assistance Equipment used: Rolling walker (2 wheels) Transfers: Sit to/from Stand Sit to Stand: Modified independent (Device/Increase time)           General transfer comment: Increased time.    Ambulation/Gait Ambulation/Gait assistance: Modified independent (Device/Increase time) Gait Distance (Feet): 20 Feet Assistive device: Rolling walker (2 wheels) Gait Pattern/deviations: Step-to pattern       General Gait Details: Slow but steady gait. No knee buckling observed. Pt denied dizziness.  Stairs            Wheelchair Mobility    Modified Rankin (Stroke Patients Only)       Balance Overall balance assessment: Needs assistance Sitting-balance support: Feet supported Sitting balance-Leahy Scale: Normal     Standing balance support: Bilateral upper extremity supported, Reliant on assistive device for balance, During functional activity Standing balance-Leahy Scale: Poor                               Pertinent Vitals/Pain Pain Assessment Pain Assessment: No/denies pain    Home Living Family/patient expects to be discharged to:: Private  residence Living Arrangements: Alone Available Help at Discharge: Neighbor Type of Home: Apartment Home Access: Level entry       Home Layout: One level Home Equipment: Wheelchair - power;Wheelchair - Forensic psychologist (2 wheels);Cane - single point;Rollator (4 wheels);Shower seat - built in;Toilet riser       Prior Function               Mobility Comments: using RW ADLs Comments: pt reports being able to perform bathing/dressing but "that's what got me in here. I couldn't get out"     Hand Dominance        Extremity/Trunk Assessment   Upper Extremity Assessment Upper Extremity Assessment: Defer to OT evaluation    Lower Extremity Assessment Lower Extremity Assessment: Generalized weakness (hip flexion  ~3-/5; knee ext 4/5. R LE appears slightly weaker than L.)    Cervical / Trunk Assessment Cervical / Trunk Assessment: Normal  Communication   Communication: No difficulties  Cognition Arousal/Alertness: Awake/alert Behavior During Therapy: WFL for tasks assessed/performed Overall Cognitive Status: Within Functional Limits for tasks assessed                                          General Comments      Exercises     Assessment/Plan    PT Assessment Patient needs continued PT services  PT Problem List Decreased strength;Decreased balance;Decreased range of motion;Decreased mobility;Decreased activity tolerance;Decreased knowledge of use of DME       PT Treatment Interventions DME instruction;Gait training;Functional mobility training;Therapeutic activities;Balance training;Patient/family education;Therapeutic exercise    PT Goals (Current goals can be found in the Care Plan section)  Acute Rehab PT Goals Patient Stated Goal: home. she is not interested in returning to rehab PT Goal Formulation: With patient Time For Goal Achievement: 09/27/22 Potential to Achieve Goals: Good    Frequency Min 1X/week     Co-evaluation               AM-PAC PT "6 Clicks" Mobility  Outcome Measure Help needed turning from your back to your side while in a flat bed without using bedrails?: None Help needed moving from lying on your back to sitting on the side of a flat bed without using bedrails?: None Help needed moving to and from a bed to a chair  (including a wheelchair)?: None Help needed standing up from a chair using your arms (e.g., wheelchair or bedside chair)?: None Help needed to walk in hospital room?: A Little Help needed climbing 3-5 steps with a railing? : A Little 6 Click Score: 22    End of Session Equipment Utilized During Treatment: Gait belt Activity Tolerance: Patient tolerated treatment well Patient left: in bed;with call bell/phone within reach   PT Visit Diagnosis: Muscle weakness (generalized) (M62.81);Difficulty in walking, not elsewhere classified (R26.2)    Time: 1610-9604 PT Time Calculation (min) (ACUTE ONLY): 24 min   Charges:   PT Evaluation $PT Eval Low Complexity: 1 Low PT Treatments $Gait Training: 8-22 mins        Faye Ramsay, PT Acute Rehabilitation  Office: [phone]

## 2022-09-13 NOTE — ED Notes (Signed)
Patient reports she recently returned home after lengthy stay in rehab.  This was her first attempt at taking a shower "if a long time."

## 2022-09-13 NOTE — ED Notes (Signed)
Patient given a female urinal and asked to provide a urine sample.

## 2022-09-13 NOTE — ED Provider Notes (Signed)
Tracey Stevens EMERGENCY DEPARTMENT AT [facility] Provider Note   CSN: [id number] Arrival date & time: 09/13/22  4098     History  Chief Complaint  Patient presents with   Extremity Weakness    Tracey Stevens is a 62 y.o. female.  Patient presents to the emergency department via EMS complaining of right lower leg pain and weakness.  Patient has a known right-sided lateral malleolus fracture and has been wearing a walking boot.  She was hospitalized last week due to altered mental status in [place] and was discharged home on Friday.  She states that she had gone to dialysis and they noticed she seemed confused and had her sent to the hospital.  She states that since going home she has had a hard time ambulating due to pain and weakness in the right lower extremity.  PT during her hospitalization recommended that the patient return to a rehab facility but the patient declined stating she wanted to go home with home health PT at that time.  Patient also complains of swelling in the right lower extremity.  She does live alone.  Patient took the brace off tonight to take a shower using a shower chair and was unable to stand from the chair.  EMS reports that once patient was assisted she was unable to stand on her own.  Patient at baseline uses a wheelchair and walker for assist devices and is able to walk small distances.  Past medical history otherwise significant for ESRD on hemodialysis, type II DM, lymphoma in remission  HPI     Home Medications Prior to Admission medications   Medication Sig Start Date End Date Taking? Authorizing Provider  acyclovir (ZOVIRAX) 400 MG tablet Take 1 tablet (400 mg) by mouth once daily after 5 pm. Take while neutropenic as directed by oncologist. 11/20/21  Yes [provider]  atorvastatin (LIPITOR) 20 MG tablet Take 20 mg by mouth daily. 04/01/21  Yes [provider]  benzonatate (TESSALON) 100 MG capsule Take 1 capsule by mouth 3  (three) times daily as needed for cough. 07/24/21  Yes [provider]  cinacalcet (SENSIPAR) 30 MG tablet Take 30 mg by mouth daily.   Yes [provider]  dapsone 100 MG tablet Take 100 mg by mouth daily.   Yes [provider]  diclofenac Sodium (VOLTAREN) 1 % GEL Apply 2 g topically daily as needed (pain).   Yes [provider]  gabapentin (NEURONTIN) 300 MG capsule Take 300 mg by mouth at bedtime. 05/23/21  Yes [provider]  metoprolol succinate (TOPROL-XL) 50 MG 24 hr tablet Take 50 mg by mouth 2 (two) times daily. 05/14/22 05/14/23 Yes [provider]  traMADol (ULTRAM) 50 MG tablet Take 50 mg by mouth 3 (three) times daily as needed for severe pain.   Yes [provider]  docusate sodium (COLACE) 100 MG capsule Take 1 capsule (100 mg total) by mouth 2 (two) times daily as needed for mild constipation. Patient not taking: Reported on 06/03/2021 03/22/21   Lorin Glass, MD  labetalol (NORMODYNE) 5 MG/ML injection Inject 2 mLs (10 mg total) into the vein every 2 (two) hours as needed (SBP > 180 or DBP > 100; HR must be > 70 to administer). Patient not taking: Reported on 06/03/2021 03/22/21   Lorin Glass, MD  lidocaine, PF, (XYLOCAINE) 1 % SOLN injection Inject 5 mLs into the skin as needed (topical anesthesia for hemodialysis if GEBAUERS is ineffective.). Patient not  taking: Reported on 06/03/2021 03/22/21   Lorin Glass, MD  lidocaine-prilocaine (EMLA) cream Apply 1 application topically as needed (topical anesthesia for hemodialysis if Gebauers and Lidocaine injection are ineffective.). Patient not taking: Reported on 06/03/2021 03/22/21   Lorin Glass, MD  magic mouthwash w/lidocaine SOLN Take 5 mLs by mouth 3 (three) times daily as needed for mouth pain. Patient not taking: Reported on 06/03/2021 03/22/21   Lorin Glass, MD  Mouthwashes (MOUTH RINSE) LIQD solution 15 mLs by Mouth Rinse route 2 (two) times daily. Patient not  taking: Reported on 06/03/2021 03/22/21   Lorin Glass, MD  ondansetron [facility]) 4 MG/2ML SOLN injection Inject 2 mLs (4 mg total) into the vein every 8 (eight) hours as needed for nausea or vomiting. Patient not taking: Reported on 06/03/2021 03/22/21   Lorin Glass, MD  pantoprazole (PROTONIX) 40 MG injection Inject 40 mg into the vein daily. Patient not taking: Reported on 06/03/2021 03/23/21   Lorin Glass, MD  pentafluoroprop-tetrafluoroeth Peggye Pitt) AERO Apply 1 application topically as needed (topical anesthesia for hemodialysis). Patient not taking: Reported on 06/03/2021 03/22/21   Lorin Glass, MD  polyethylene glycol (MIRALAX / GLYCOLAX) 17 g packet Take 17 g by mouth daily as needed for moderate constipation. Patient not taking: Reported on 06/03/2021 03/22/21   Lorin Glass, MD  Tbo-Filgrastim [facility]) 480 MCG/0.8ML SOSY injection Inject 0.8 mLs (480 mcg total) into the skin daily at 6 (six) AM. Patient not taking: Reported on 06/03/2021 03/22/21   Lorin Glass, MD      Allergies    Other, Amlodipine, and Methotrexate    Review of Systems   Review of Systems  Physical Exam Updated Vital Signs BP 118/69   Pulse 97   Temp 98.2 F (36.8 C)   Resp 20   SpO2 94%  Physical Exam  ED Results / Procedures / Treatments   Labs (all labs ordered are listed, but only abnormal results are displayed) Labs Reviewed  BASIC METABOLIC PANEL - Abnormal; Notable for the following components:      Result Value   BUN 41 (*)    Creatinine, Ser 9.15 (*)    GFR, Estimated 4 (*)    All other components within normal limits  CBC WITH DIFFERENTIAL/PLATELET - Abnormal; Notable for the following components:   RBC 2.74 (*)    Hemoglobin 9.1 (*)    HCT 29.5 (*)    MCV 107.7 (*)    Platelets 146 (*)    Monocytes Absolute 2.1 (*)    Abs Immature Granulocytes 0.08 (*)    All other components within normal limits    EKG None  Radiology VAS [place] LOWER EXTREMITY VENOUS (DVT)  (7a-7p)  Result Date: 09/13/2022  Lower Venous DVT Study Patient Name:  Tracey Stevens  Date of Exam:   09/13/2022 Medical Rec #: [id number]     Accession #:    [id number] Date of Birth: 05/24/1960     Patient Gender: F Patient Age:   39 years Exam Location:  [facility] Procedure:      VAS [place] LOWER EXTREMITY VENOUS (DVT) Referring Phys: Barrie Dunker --------------------------------------------------------------------------------  Indications: Swelling.  Risk Factors: None identified. Limitations: Poor ultrasound/tissue interface and body habitus. Comparison Study: No prior studies. Performing Technologist: Chanda Busing RVT  Examination Guidelines: A complete evaluation includes B-mode imaging, spectral Doppler, color Doppler, and power Doppler as needed of all accessible portions of each vessel. Bilateral testing is considered an integral part  of a complete examination. Limited examinations for reoccurring indications may be performed as noted. The reflux portion of the exam is performed with the patient in reverse Trendelenburg.  +---------+---------------+---------+-----------+----------+--------------+ RIGHT    CompressibilityPhasicitySpontaneityPropertiesThrombus Aging +---------+---------------+---------+-----------+----------+--------------+ CFV      Full           Yes      Yes                                 +---------+---------------+---------+-----------+----------+--------------+ SFJ      Full                                                        +---------+---------------+---------+-----------+----------+--------------+ FV Prox  Full                                                        +---------+---------------+---------+-----------+----------+--------------+ FV Mid   Full                                                        +---------+---------------+---------+-----------+----------+--------------+ FV Distal               Yes      Yes                                  +---------+---------------+---------+-----------+----------+--------------+ PFV      Full                                                        +---------+---------------+---------+-----------+----------+--------------+ POP      Full           Yes      Yes                                 +---------+---------------+---------+-----------+----------+--------------+ PTV      Full                                                        +---------+---------------+---------+-----------+----------+--------------+ PERO     Full                                                        +---------+---------------+---------+-----------+----------+--------------+   +----+---------------+---------+-----------+----------+--------------+ LEFTCompressibilityPhasicitySpontaneityPropertiesThrombus Aging +----+---------------+---------+-----------+----------+--------------+ CFV Full           Yes  Yes                                 +----+---------------+---------+-----------+----------+--------------+     Summary: RIGHT: - There is no evidence of deep vein thrombosis in the lower extremity. However, portions of this examination were limited- see technologist comments above.  - No cystic structure found in the popliteal fossa.  LEFT: - No evidence of common femoral vein obstruction.  *See table(s) above for measurements and observations. Electronically signed by Coral Else MD on 09/13/2022 at 11:18:57 AM.    Final    DG Ankle Complete Right  Result Date: 09/13/2022 CLINICAL DATA:  62 year old female with right ankle pain. Medial malleolus fracture diagnosed on 09/07/2022. EXAM: RIGHT ANKLE - COMPLETE 3+ VIEW COMPARISON:  Right foot series 09/07/2022. FINDINGS: Un healed nondisplaced right medial malleolus fracture, lucency tracking to the level of the medial talar dome. Maintained mortise joint alignment. Talar dome intact. No obvious joint effusion. But severe soft  tissue swelling in the visible leg and foot. Small venous phleboliths. Distal fibula, talus, calcaneus, and other visible right foot bones appear intact. IMPRESSION: 1. Un-healed, nondisplaced right medial malleolus fracture. 2. No new fracture or dislocation identified about the right ankle. 3. Generalized soft tissue swelling. Electronically Signed   By: Odessa Fleming M.D.   On: 09/13/2022 07:38    Procedures Procedures    Medications Ordered in ED Medications - No data to display  ED Course/ Medical Decision Making/ A&P                             Medical Decision Making Amount and/or Complexity of Data Reviewed Labs: ordered. Radiology: ordered.   This patient presents to the ED for concern of right lower extremity pain, this involves an extensive number of treatment options, and is a complaint that carries with it a high risk of complications and morbidity.  The differential diagnosis includes fracture, dislocation, DVT, soft tissue injury, others   Co morbidities that complicate the patient evaluation  Known medial malleolus fracture   Additional history obtained:  Additional history obtained from EMS External records from outside source obtained and reviewed including hospital records from recent hospitalization   Lab Tests:  I Ordered, and personally interpreted labs.  The pertinent results include: Hemoglobin 9.1, unremarkable BMP (patient on hemodialysis)   Imaging Studies ordered:  I ordered imaging studies including plain films of right ankle, DVT study of right lower extremity I independently visualized and interpreted imaging which showed nondisplaced medial malleolus fracture, no DVT I agree with the radiologist interpretation   Consultations Obtained:  I requested evaluation by PT/OT.     Social Determinants of Health:  Patient lives at home alone   Test / Admission - Considered:  Discussed [facility] consult for possible rehab placement patient declines.   Patient is adamant that she wants to return home.  Patient was able to ambulate with assistance with PT.  OT also evaluated at bedside and recommends continued home health PT/OT.  This was the first time patient has attempted to do anything with the right lower extremity since discharge from the hospital.  I do feel patient would likely benefit from rehab placement but  the patient has previously been in rehab and would rather continue with home health PT.  She feels that she is able to ambulate sufficiently at this time to go home.  Plan to discharge with return precautions.  Patient understands that if she is unable to take care of she may need repeat rehab placement.         Final Clinical Impression(s) / ED Diagnoses Final diagnoses:  Nondisplaced fracture of medial malleolus of right tibia, initial encounter for closed fracture    Rx / DC Orders ED Discharge Orders     None         Pamala Duffel 09/13/22 1502    Terald Sleeper, MD 09/13/22 [phone]

## 2022-09-13 NOTE — Evaluation (Addendum)
Occupational Therapy Evaluation Patient Details Name: Tracey Stevens MRN: [id number] DOB: [birth year] Today's Date: 09/13/2022   History of Present Illness 62 yr old female admitted brought to the hospital with R LE weakness. Pt reports she was in her shower at home and couldn't get out. Recent discharge from short term SNF rehab . History of recent R medial malleolus fx, R knee effusion, anemia, CHF, cervical CA, lymphoma, ESRD on dialysis, vertigo, DM   Clinical Impression   The pt currently requires supervision to min guard assist for tasks, including lower body dressing, supine to sit, sit to stand, and simulated toileting. She was recently discharged home from short-term SNF rehab a couple days ago.  She presented with good effort and participation in the session today. She will benefit from OT services in the hospital setting to maximize her independence with self-care tasks. OT anticipates she is okay to return home once discharged from the hospital, however home health OT services and increased home health aide services are recommended.      Recommendations for follow up therapy are one component of a multi-disciplinary discharge planning process, led by the attending physician.  Recommendations may be updated based on patient status, additional functional criteria and insurance authorization.   Assistance Recommended at Discharge Intermittent Supervision/Assistance  Patient can return home with the following Assistance with cooking/housework;Assist for transportation;A little help with bathing/dressing/bathroom    Functional Status Assessment  Patient has had a recent decline in their functional status and demonstrates the ability to make significant improvements in function in a reasonable and predictable amount of time.  Equipment Recommendations  None recommended by OT    Recommendations for Other Services       Precautions / Restrictions Precautions Precautions:  Fall Restrictions Weight Bearing Restrictions: Yes RLE Weight Bearing: Weight bearing as tolerated (in CAM Boot)      Mobility Bed Mobility Overal bed mobility: Needs Assistance Bed Mobility: Supine to Sit, Sit to Supine     Supine to sit: Supervision Sit to supine: Supervision        Transfers Overall transfer level: Needs assistance Equipment used: None Transfers: Sit to/from Stand Sit to Stand: Supervision           General transfer comment: Increased time.      Balance Overall balance assessment: Needs assistance   Sitting balance-Leahy Scale: Good         Standing balance comment: min guard assist for static standing                ADL either performed or assessed with clinical judgement   ADL Overall ADL's : Needs assistance/impaired Eating/Feeding: Independent;Sitting   Grooming: Set up;Sitting           Upper Body Dressing : Set up;Sitting   Lower Body Dressing: Min guard;Sit to/from stand   Toilet Transfer: Min guard;Ambulation;Rolling walker (2 wheels) Toilet Transfer Details (indicate cue type and reason): based on clinical judgement                 Vision   Additional Comments: She correctly read the time depicted on the wall clock.            Pertinent Vitals/Pain Pain Assessment Pain Assessment: No/denies pain     Hand Dominance Right   Extremity/Trunk Assessment Upper Extremity Assessment Upper Extremity Assessment: Overall WFL for tasks assessed; BUE AROM and strength WFL   Lower Extremity Assessment Lower Extremity Assessment: Defer to PT evaluation  Communication Communication Communication: No difficulties   Cognition Arousal/Alertness: Awake/alert Behavior During Therapy: WFL for tasks assessed/performed Overall Cognitive Status: Within Functional Limits for tasks assessed             General Comments: Oriented x4, able to follow commands without difficulty                Home  Living Family/patient expects to be discharged to:: Private residence Living Arrangements: Alone Available Help at Discharge: Neighbor Type of Home: Apartment Home Access: Level entry     Home Layout: One level     Bathroom Shower/Tub: Dietitian: Wheelchair - power;Wheelchair - Forensic psychologist (2 wheels);Cane - single point;Rollator (4 wheels);Tub bench   Additional Comments: She goes to dialysis and/or chemo 4 days per week.      Prior Functioning/Environment Prior Level of Function : Independent/Modified Independent             Mobility Comments:  (Prior to recent R malleolus fracture, she used a rollator for ambulation.) ADLs Comments:  (She was modified independent to independent with ADLs, she does not drive, & she had a home health aide 7 days per week for 2 hours each day who assisted with cleaning & laundry. Her home aide services were recently halted so she hoping to get them restarted. She was only home 2 days from SNF rehab, prior to being brought to the hospital this time).        OT Problem List: Decreased strength;Impaired balance (sitting and/or standing);Decreased activity tolerance      OT Treatment/Interventions: Self-care/ADL training;Therapeutic exercise;Energy conservation;DME and/or AE instruction;Therapeutic activities;Patient/family education;Balance training    OT Goals(Current goals can be found in the care plan section) Acute Rehab OT Goals Patient Stated Goal: to discharge home soon OT Goal Formulation: With patient Time For Goal Achievement: 09/27/22 Potential to Achieve Goals: Good ADL Goals Pt Will Perform Grooming: with modified independence;standing Pt Will Perform Lower Body Dressing: with modified independence;sit to/from stand Pt Will Transfer to Toilet: with modified independence;ambulating Pt Will Perform Toileting - Clothing Manipulation and hygiene: with modified independence;sit to/from stand   OT Frequency: Min 1X/week       AM-PAC OT "6 Clicks" Daily Activity     Outcome Measure Help from another person eating meals?: None Help from another person taking care of personal grooming?: A Little Help from another person toileting, which includes using toliet, bedpan, or urinal?: A Little Help from another person bathing (including washing, rinsing, drying)?: A Little Help from another person to put on and taking off regular upper body clothing?: None Help from another person to put on and taking off regular lower body clothing?: A Little 6 Click Score: 20   End of Session Equipment Utilized During Treatment: Other (comment) (none) Nurse Communication: Other (comment)  Activity Tolerance: Patient tolerated treatment well Patient left: in bed;with call bell/phone within reach  OT Visit Diagnosis: Unsteadiness on feet (R26.81);Muscle weakness (generalized) (M62.81)                Time: 1610-9604 OT Time Calculation (min): 16 min Charges:  OT General Charges $OT Visit: 1 Visit OT Evaluation $OT Eval Low Complexity: 1 Low   Trisha Ken L Kourtney Terriquez, OTR/L 09/13/2022, 3:13 PM

## 2022-09-13 NOTE — ED Notes (Signed)
[facility] notified for pt transport 

## 2022-09-13 NOTE — ED Notes (Signed)
Pt said they would contact OT to come see this patient.

## 2022-09-13 NOTE — Progress Notes (Signed)
Right lower extremity venous duplex has been completed. Preliminary results can be found in CV Proc through chart review.  Results were given to Barrie Dunker PA.  09/13/22 9:18 AM Olen Cordial RVT

## 2022-09-13 NOTE — ED Notes (Signed)
Patient said she tried and was unable to provide a urine sample at this time.

## 2022-09-13 NOTE — Discharge Instructions (Addendum)
You were evaluated today for right lower extremity pain.  X-rays continue to show a nondisplaced fracture of right ankle.  Please continue to wear your walking boot.  Please use an assist device at home such as a walker when ambulating.  If you continue to have difficulty at home with activities of daily living you may need placement in short-term rehab.  If you develop any life-threatening symptoms please return to the emergency department.  Please keep your scheduled follow-up appointment with orthopedics.

## 2022-09-13 NOTE — ED Notes (Signed)
Pt wants to go home- states that she does not have her phone or house keys. Attempted to reach out to get in touch with her neighbor who can let her in to the house- left a message.

## 2022-09-13 NOTE — ED Notes (Signed)
OT at bedside. 

## 2022-09-13 NOTE — ED Triage Notes (Signed)
Patient BIB [facility] for evaluation of R leg weakness.  Reports she recently injured her R ankle and has been wearing a brace.  Tonight she took brace off and attempted to take shower using a shower chair.  Was unable to get up from chair.  At baseline, pt is able to walk small distances from her wheelchair.  Lives alone.  EMS reports that once they got patient up, patient was unable to stand on her own.

## 2022-09-18 ENCOUNTER — Emergency Department (HOSPITAL_COMMUNITY)
Admission: EM | Admit: 2022-09-18 | Discharge: 2022-09-19 | Disposition: A | Discharge status: Home / Self Care | Payer: Medicare HMO | Attending: Emergency Medicine | Admitting: Emergency Medicine

## 2022-09-18 ENCOUNTER — Emergency Department (HOSPITAL_COMMUNITY): Payer: Medicare HMO

## 2022-09-18 ENCOUNTER — Other Ambulatory Visit: Payer: Self-pay

## 2022-09-18 DIAGNOSIS — R197 Diarrhea, unspecified: Secondary | ICD-10-CM

## 2022-09-18 DIAGNOSIS — Z8541 Personal history of malignant neoplasm of cervix uteri: Secondary | ICD-10-CM | POA: Insufficient documentation

## 2022-09-18 DIAGNOSIS — R5381 Other malaise: Secondary | ICD-10-CM | POA: Diagnosis not present

## 2022-09-18 DIAGNOSIS — I509 Heart failure, unspecified: Secondary | ICD-10-CM | POA: Diagnosis not present

## 2022-09-18 DIAGNOSIS — E1122 Type 2 diabetes mellitus with diabetic chronic kidney disease: Secondary | ICD-10-CM | POA: Insufficient documentation

## 2022-09-18 DIAGNOSIS — Z79899 Other long term (current) drug therapy: Secondary | ICD-10-CM | POA: Insufficient documentation

## 2022-09-18 DIAGNOSIS — Z992 Dependence on renal dialysis: Secondary | ICD-10-CM | POA: Diagnosis not present

## 2022-09-18 DIAGNOSIS — N186 End stage renal disease: Secondary | ICD-10-CM | POA: Insufficient documentation

## 2022-09-18 DIAGNOSIS — I132 Hypertensive heart and chronic kidney disease with heart failure and with stage 5 chronic kidney disease, or end stage renal disease: Secondary | ICD-10-CM | POA: Insufficient documentation

## 2022-09-18 DIAGNOSIS — R42 Dizziness and giddiness: Secondary | ICD-10-CM | POA: Diagnosis present

## 2022-09-18 LAB — CBC WITH DIFFERENTIAL/PLATELET
Abs Immature Granulocytes: 0.04 10*3/uL (ref 0.00–0.07)
Basophils Absolute: 0 10*3/uL (ref 0.0–0.1)
Basophils Relative: 0 %
Eosinophils Absolute: 0.1 10*3/uL (ref 0.0–0.5)
Eosinophils Relative: 2 %
HCT: 26.8 % — ABNORMAL LOW (ref 36.0–46.0)
Hemoglobin: 8.4 g/dL — ABNORMAL LOW (ref 12.0–15.0)
Immature Granulocytes: 1 %
Lymphocytes Relative: 20 %
Lymphs Abs: 0.8 10*3/uL (ref 0.7–4.0)
MCH: 34.3 pg — ABNORMAL HIGH (ref 26.0–34.0)
MCHC: 31.3 g/dL (ref 30.0–36.0)
MCV: 109.4 fL — ABNORMAL HIGH (ref 80.0–100.0)
Monocytes Absolute: 1 10*3/uL (ref 0.1–1.0)
Monocytes Relative: 24 %
Neutro Abs: 2.1 10*3/uL (ref 1.7–7.7)
Neutrophils Relative %: 53 %
Platelets: 117 10*3/uL — ABNORMAL LOW (ref 150–400)
RBC: 2.45 MIL/uL — ABNORMAL LOW (ref 3.87–5.11)
RDW: 15.2 % (ref 11.5–15.5)
WBC: 4 10*3/uL (ref 4.0–10.5)
nRBC: 0 % (ref 0.0–0.2)

## 2022-09-18 LAB — COMPREHENSIVE METABOLIC PANEL
ALT: 9 U/L (ref 0–44)
AST: 10 U/L — ABNORMAL LOW (ref 15–41)
Albumin: 3.2 g/dL — ABNORMAL LOW (ref 3.5–5.0)
Alkaline Phosphatase: 97 U/L (ref 38–126)
Anion gap: 15 (ref 5–15)
BUN: 42 mg/dL — ABNORMAL HIGH (ref 8–23)
CO2: 23 mmol/L (ref 22–32)
Calcium: 9.1 mg/dL (ref 8.9–10.3)
Chloride: 102 mmol/L (ref 98–111)
Creatinine, Ser: 12.48 mg/dL — ABNORMAL HIGH (ref 0.44–1.00)
GFR, Estimated: 3 mL/min — ABNORMAL LOW (ref >60)
Glucose, Bld: 79 mg/dL (ref 70–99)
Potassium: 3.7 mmol/L (ref 3.5–5.1)
Sodium: 140 mmol/L (ref 135–145)
Total Bilirubin: 1 mg/dL (ref 0.3–1.2)
Total Protein: 4.9 g/dL — ABNORMAL LOW (ref 6.5–8.1)

## 2022-09-18 LAB — HEPATITIS B SURFACE ANTIGEN: Hepatitis B Surface Ag: NONREACTIVE

## 2022-09-18 LAB — LIPASE, BLOOD: Lipase: 28 U/L (ref 11–51)

## 2022-09-18 LAB — POC OCCULT BLOOD, ED: Fecal Occult Bld: NEGATIVE

## 2022-09-18 MED ORDER — DICLOFENAC SODIUM 1 % EX GEL: 2.0000 g | Freq: Every day | CUTANEOUS | Status: DC | PRN

## 2022-09-18 MED ORDER — HEPARIN SODIUM (PORCINE) 1000 UNIT/ML DIALYSIS: 2000.0000 [IU] | INTRAMUSCULAR | Status: DC | PRN

## 2022-09-18 MED ORDER — ALTEPLASE 2 MG IJ SOLR: 2.0000 mg | Freq: Once | INTRAMUSCULAR | Status: DC | PRN

## 2022-09-18 MED ORDER — CINACALCET HCL 30 MG PO TABS
30.0000 mg | ORAL_TABLET | Freq: Every day | ORAL | Status: DC
  Administered 2022-09-18 – 2022-09-19 (×2): 30 mg via ORAL
  Filled 2022-09-18 (×2): qty 1

## 2022-09-18 MED ORDER — PENTAFLUOROPROP-TETRAFLUOROETH EX AERO: 1.0000 | INHALATION_SPRAY | CUTANEOUS | Status: DC | PRN

## 2022-09-18 MED ORDER — ATORVASTATIN CALCIUM 10 MG PO TABS: 20.0000 mg | ORAL_TABLET | Freq: Every day | ORAL | Status: DC

## 2022-09-18 MED ORDER — HEPARIN SODIUM (PORCINE) 1000 UNIT/ML DIALYSIS
1000.0000 [IU] | INTRAMUSCULAR | Status: DC | PRN
  Filled 2022-09-18: qty 1

## 2022-09-18 MED ORDER — LIDOCAINE HCL (PF) 1 % IJ SOLN: 5.0000 mL | INTRAMUSCULAR | Status: DC | PRN

## 2022-09-18 MED ORDER — CHLORHEXIDINE GLUCONATE CLOTH 2 % EX PADS: 6.0000 | MEDICATED_PAD | Freq: Every day | CUTANEOUS | Status: DC

## 2022-09-18 MED ORDER — DAPSONE 100 MG PO TABS
100.0000 mg | ORAL_TABLET | Freq: Every day | ORAL | Status: DC
  Administered 2022-09-18 – 2022-09-19 (×2): 100 mg via ORAL
  Filled 2022-09-18 (×2): qty 1

## 2022-09-18 MED ORDER — DARBEPOETIN ALFA 100 MCG/0.5ML IJ SOSY
100.0000 ug | PREFILLED_SYRINGE | INTRAMUSCULAR | Status: DC
  Administered 2022-09-19: 100 ug via SUBCUTANEOUS
  Filled 2022-09-18 (×3): qty 0.5

## 2022-09-18 MED ORDER — TRAMADOL HCL 50 MG PO TABS
50.0000 mg | ORAL_TABLET | Freq: Three times a day (TID) | ORAL | Status: DC | PRN
  Filled 2022-09-18 (×2): qty 1

## 2022-09-18 MED ORDER — METOPROLOL SUCCINATE ER 25 MG PO TB24
50.0000 mg | ORAL_TABLET | Freq: Two times a day (BID) | ORAL | Status: DC
  Administered 2022-09-18 – 2022-09-19 (×2): 50 mg via ORAL
  Filled 2022-09-18 (×3): qty 2

## 2022-09-18 MED ORDER — ANTICOAGULANT SODIUM CITRATE 4% (200MG/5ML) IV SOLN: 5.0000 mL | Status: DC | PRN

## 2022-09-18 MED ORDER — ATORVASTATIN CALCIUM 40 MG PO TABS
20.0000 mg | ORAL_TABLET | Freq: Every day | ORAL | Status: DC
  Administered 2022-09-18: 20 mg via ORAL
  Filled 2022-09-18 (×2): qty 2

## 2022-09-18 MED ORDER — GABAPENTIN 300 MG PO CAPS
300.0000 mg | ORAL_CAPSULE | Freq: Every day | ORAL | Status: DC
  Administered 2022-09-18: 300 mg via ORAL
  Filled 2022-09-18 (×2): qty 1

## 2022-09-18 MED ORDER — LIDOCAINE-PRILOCAINE 2.5-2.5 % EX CREA: 1.0000 | TOPICAL_CREAM | CUTANEOUS | Status: DC | PRN

## 2022-09-18 NOTE — Progress Notes (Signed)
  [facility] Progress Note   Subjective:  Tracey Stevens is a 62 year old woman with ESRD on HD MWF. We are asked to see for routine dialysis while she is in the ED.  She recently had an ankle fracture and then went to a SNF. She went home then hospitalized again at [facility] last week and refused SNF placement. She then went home and has been unable to care for herself adequately. Now realizing she will need more assistance so presenting to ED for SNF placement.   Last dialysis was Wed. 7/3. Completed 3h 21 minutes and left under her dry weight.  Seen and examined in the ED. No particular complaints, just realizing that she needs more help.  Denies cp, sob, n/v.   Objective Vitals:   09/18/22 1400 09/18/22 1415 09/18/22 1430 09/18/22 1439  BP: 127/62 (!) 143/86    Pulse: 87 83 89   Resp:  20 (!) 21   Temp:    98.6 F (37 C)  TempSrc:    Oral  SpO2: 96% (!) 82% 98%   Weight:      Height:         Additional Objective Labs: Basic Metabolic Panel: Recent Labs  Lab 09/13/22 0819 09/18/22 1035  NA 141 140  K 3.8 3.7  CL 105 102  CO2 25 23  GLUCOSE 88 79  BUN 41* 42*  CREATININE 9.15* 12.48*  CALCIUM 10.0 9.1   CBC: Recent Labs  Lab 09/13/22 0819 09/18/22 1035  WBC 8.0 4.0  NEUTROABS 4.9 2.1  HGB 9.1* 8.4*  HCT 29.5* 26.8*  MCV 107.7* 109.4*  PLT 146* 117*   Blood Culture    Component Value Date/Time   SDES BLOOD RIGHT ANTECUBITAL 09/07/2022 1835   SPECREQUEST  09/07/2022 1835    BOTTLES DRAWN AEROBIC AND ANAEROBIC Blood Culture results may not be optimal due to an inadequate volume of blood received in culture bottles   CULT  09/07/2022 1835    NO GROWTH 5 DAYS Performed at [facility], [address] [place], [place] [address]    REPTSTATUS 09/12/2022 FINAL 09/07/2022 1835     Physical Exam General: Sitting up in bed, nad Heart: RRR  Lungs: Clear  Abdomen: soft obese Extremities: no LE edema; R foot in brace Dialysis Access: TDC in  place   Medications:   [START ON 09/19/2022] Chlorhexidine Gluconate Cloth  6 each Topical Q0600    Dialysis Orders:  [facility] MWF 4:15 400/800 EDW 122kg 2K/2Ca TDC No bolus heparin  Mircera 1000 q 2 wks (last 6/21) Hectorol 8 q HD  Last OP HD 7/3 Post wt 121kg   Assessment/Plan: Debility - For SNF placement, boarding in the ED   ESRD - HD MWF. Continue on schedule. Plan for HD tonight.  HTN/volume- BP/volume acceptable. Losing weight. Will need EDW adjusted Anemia- Hgb 8.4. ESA due today, will order  MBD of CKD -  Calcium/Phos acceptable. Continue sevelamer binder.  Nutrition -  Renal diet with fluid restriction   Tomasa Blase PA-C [facility] 09/18/2022,2:45 PM

## 2022-09-18 NOTE — ED Notes (Signed)
Patient transported to Dialysis at this time.

## 2022-09-18 NOTE — ED Provider Notes (Signed)
[place] EMERGENCY DEPARTMENT AT [facility] Provider Note   CSN: [id number] Arrival date & time: 09/18/22  1029     History  Chief Complaint  Patient presents with   Dizziness    Pt coming from home with recurrent dizziness, weakness. Pt is tachypneic, intermittent shob, dialysis patient. Denies cp    Tracey Stevens is a 62 y.o. female.  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  Pt was admitted to [facility] from 6/25-28 for AMS.  Pt lives alone and has been feeling weak.  A rehab facility was recommended at d/c, but she refused.  She came back to the ED on 6/30 for weakness.  She refused to go back to rehab then.  She comes back to the ED today for continued weakness, but she also has diarrhea.  She was supposed to go to dialysis today, but came here instead.  She tells me she is willing to go to rehab now as long as it is not where she went before.  No f/c.  No sob.         Home Medications Prior to Admission medications   Medication Sig Start Date End Date Taking? Authorizing Provider  acyclovir (ZOVIRAX) 400 MG tablet Take 1 tablet (400 mg) by mouth once daily after 5 pm. Take while neutropenic as directed by oncologist. 11/20/21   [provider]  atorvastatin (LIPITOR) 20 MG tablet Take 20 mg by mouth daily. 04/01/21   [provider]  benzonatate (TESSALON) 100 MG capsule Take 1 capsule by mouth 3 (three) times daily as needed for cough. 07/24/21   [provider]  cinacalcet (SENSIPAR) 30 MG tablet Take 30 mg by mouth daily.    [provider]  dapsone 100 MG tablet Take 100 mg by mouth daily.    [provider]  diclofenac Sodium (VOLTAREN) 1 % GEL Apply 2 g topically daily as needed (pain).    [provider]  docusate sodium (COLACE) 100  MG capsule Take 1 capsule (100 mg total) by mouth 2 (two) times daily as needed for mild constipation. Patient not taking: Reported on 06/03/2021 03/22/21   Lorin Glass, MD  gabapentin (NEURONTIN) 300 MG capsule Take 300 mg by mouth at bedtime. 05/23/21   [provider]  labetalol (NORMODYNE) 5 MG/ML injection Inject 2 mLs (10 mg total) into the vein every 2 (two) hours as needed (SBP > 180 or DBP > 100; HR must be > 70 to administer). Patient not taking: Reported on 06/03/2021 03/22/21   Lorin Glass, MD  lidocaine, PF, (XYLOCAINE) 1 % SOLN injection Inject 5 mLs into the skin as needed (topical anesthesia for hemodialysis if GEBAUERS is ineffective.). Patient not taking: Reported on 06/03/2021 03/22/21   Lorin Glass, MD  lidocaine-prilocaine (EMLA) cream Apply 1 application topically as needed (topical anesthesia for hemodialysis if Gebauers and Lidocaine injection are ineffective.). Patient not taking: Reported on 06/03/2021 03/22/21   Lorin Glass, MD  magic mouthwash w/lidocaine SOLN Take 5 mLs by mouth 3 (three) times daily as needed for mouth pain. Patient not taking: Reported on 06/03/2021 03/22/21   Lorin Glass, MD  metoprolol succinate (TOPROL-XL) 50 MG 24 hr tablet Take 50 mg by mouth 2 (two) times daily. 05/14/22 05/14/23  [provider]  Mouthwashes (MOUTH RINSE) LIQD solution 15 mLs by Mouth Rinse route 2 (two) times daily. Patient not taking: Reported on 06/03/2021 03/22/21   Lorin Glass, MD  ondansetron [facility]) 4 MG/2ML SOLN injection Inject 2 mLs (4 mg total) into the vein every 8 (eight) hours as needed for nausea or vomiting. Patient not taking: Reported on 06/03/2021 03/22/21   Lorin Glass, MD  pantoprazole (PROTONIX) 40 MG injection Inject 40 mg into the vein daily. Patient not taking: Reported on 06/03/2021 03/23/21   Lorin Glass, MD  pentafluoroprop-tetrafluoroeth Peggye Pitt) AERO Apply 1 application topically as needed (topical anesthesia for  hemodialysis). Patient not taking: Reported on 06/03/2021 03/22/21   Lorin Glass, MD  polyethylene glycol (MIRALAX / GLYCOLAX) 17 g packet Take 17 g by mouth daily as needed for moderate constipation. Patient not taking: Reported on 06/03/2021 03/22/21   Lorin Glass, MD  Tbo-Filgrastim [facility]) 480 MCG/0.8ML SOSY injection Inject 0.8 mLs (480 mcg total) into the skin daily at 6 (six) AM. Patient not taking: Reported on 06/03/2021 03/22/21   Lorin Glass, MD  traMADol (ULTRAM) 50 MG tablet Take 50 mg by mouth 3 (three) times daily as needed for severe pain.    [provider]      Allergies    Other, Amlodipine, and Methotrexate    Review of Systems   Review of Systems  Gastrointestinal:  Positive for diarrhea.  All other systems reviewed and are negative.   Physical Exam Updated Vital Signs BP (!) 143/86   Pulse 89   Temp 98.6 F (37 C) (Oral)   Resp (!) 21   Ht 6\' 1"  (1.854 m)   Wt 104.3 kg   SpO2 98%   BMI 30.34 kg/m  Physical Exam Vitals and nursing note reviewed.  Constitutional:      Appearance: Normal appearance. She is obese.  HENT:     Head: Normocephalic and atraumatic.     Right Ear: External ear normal.     Left Ear: External ear normal.     Nose: Nose normal.     Mouth/Throat:     Mouth: Mucous membranes are moist.     Pharynx: Oropharynx is clear.  Eyes:     Extraocular Movements: Extraocular movements intact.     Conjunctiva/sclera: Conjunctivae normal.     Pupils: Pupils are equal, round, and reactive to light.  Cardiovascular:     Rate and Rhythm: Normal rate and regular rhythm.     Pulses: Normal pulses.     Heart sounds: Normal heart sounds.  Pulmonary:     Effort: Pulmonary effort is normal.     Breath sounds: Normal breath sounds.  Chest:     Comments: Port in right upper chest; dialysis cath in left upper chest Abdominal:     General: Abdomen is flat. Bowel sounds are normal.  Genitourinary:    Rectum: Guaiac result negative.   Musculoskeletal:     Cervical back: Normal range of motion and neck supple.     Comments: RLE in boot  Skin:    General: Skin is warm.     Capillary Refill: Capillary refill takes less than 2 seconds.  Neurological:     General: No focal deficit present.     Mental Status: She is alert and oriented to person, place, and time.  Psychiatric:        Mood and Affect: Mood normal.        Behavior: Behavior normal.  ED Results / Procedures / Treatments   Labs (all labs ordered are listed, but only abnormal results are displayed) Labs Reviewed  CBC WITH DIFFERENTIAL/PLATELET - Abnormal; Notable for the following components:      Result Value   RBC 2.45 (*)    Hemoglobin 8.4 (*)    HCT 26.8 (*)    MCV 109.4 (*)    MCH 34.3 (*)    Platelets 117 (*)    All other components within normal limits  COMPREHENSIVE METABOLIC PANEL - Abnormal; Notable for the following components:   BUN 42 (*)    Creatinine, Ser 12.48 (*)    Total Protein 4.9 (*)    Albumin 3.2 (*)    AST 10 (*)    GFR, Estimated 3 (*)    All other components within normal limits  LIPASE, BLOOD  URINALYSIS, ROUTINE W REFLEX MICROSCOPIC  HEPATITIS B SURFACE ANTIGEN  HEPATITIS B SURFACE ANTIBODY, QUANTITATIVE  POC OCCULT BLOOD, ED    EKG EKG Interpretation Date/Time:  Friday September 18 2022 10:51:29 EDT Ventricular Rate:  83 PR Interval:  202 QRS Duration:  126 QT Interval:  393 QTC Calculation: 462 R Axis:   -54  Text Interpretation: Sinus rhythm Left bundle branch block Since last tracing rate slower LBBB is old Confirmed by Jacalyn Lefevre [id number] on 09/18/2022 11:17:22 AM  Radiology DG Chest Portable 1 View  Result Date: 09/18/2022 CLINICAL DATA:  Dizziness EXAM: PORTABLE CHEST 1 VIEW COMPARISON:  X-ray 09/07/2022 and CT angiogram FINDINGS: Slight linear opacity left lung base likely scar or atelectasis. No consolidation, pneumothorax, effusion or edema. Normal cardiopericardial silhouette with tortuous and  ectatic aorta. Overlapping cardiac leads. There is a left-sided IJ catheter with tip along the central SVC right atrial junction region. Separate right IJ chest port with the tip in the same location. IMPRESSION: Stable left IJ double-lumen catheter and right IJ chest port. Slight left basilar atelectasis. Electronically Signed   By: Karen Kays M.D.   On: 09/18/2022 11:12    Procedures Procedures    Medications Ordered in ED Medications  Chlorhexidine Gluconate Cloth 2 % PADS 6 each (has no administration in time range)  Darbepoetin Alfa (ARANESP) injection 100 mcg (has no administration in time range)  cinacalcet (SENSIPAR) tablet 30 mg (has no administration in time range)  dapsone tablet 100 mg (has no administration in time range)  diclofenac Sodium (VOLTAREN) 1 % topical gel 2 g (has no administration in time range)  gabapentin (NEURONTIN) capsule 300 mg (has no administration in time range)  metoprolol succinate (TOPROL-XL) 24 hr tablet 50 mg (has no administration in time range)  traMADol (ULTRAM) tablet 50 mg (has no administration in time range)  atorvastatin (LIPITOR) tablet 20 mg (has no administration in time range)    ED Course/ Medical Decision Making/ A&P                             Medical Decision Making Amount and/or Complexity of Data Reviewed Labs: ordered. Radiology: ordered.  Risk Prescription drug management.   This patient presents to the ED for concern of dizziness/weakness, this involves an extensive number of treatment options, and is a complaint that carries with it a high risk of complications and morbidity.  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  The pertinent results include:  cbc with hgb 8.4 and plt low at 117 (hgb 9.1 and plt 146 on 5/30)   Imaging Studies ordered:  I ordered imaging studies including cxr  I independently visualized and interpreted imaging which showed  Stable left IJ double-lumen catheter and right IJ chest port.    Slight left basilar atelectasis.   I agree with the radiologist interpretation   Cardiac Monitoring:  The patient was maintained on a cardiac monitor.  I personally viewed and interpreted the cardiac monitored which showed an underlying rhythm of: nsr   Medicines ordered and prescription drug management:   I have reviewed the patients home medicines and have made adjustments as needed  Consultations Obtained:  I requested consultation with TOC,  and discussed lab and imaging findings as well as pertinent plan - they will work on placement Pt is a dialysis patient who did not have dialysis today.  I spoke with Dr. Juel Burrow (nephrology) to let him know pt will need dialysis while here.   Problem List / ED Course:  Weakness:  this seems to be a chronic issue.  Rehab placement has been recommended at last admission.  Pt is now willing to go.  Diarrhea:  no diarrhea while here.  She is eating well. Hx ALL:  pt is not neutropenic.  Hgb is down a little, but not significantly.  She does not need a transfusion. ESRD on HD MWF:  Nephrology notified of need for dialysis while here.   Reevaluation:  After the interventions noted above, I reevaluated the patient and found that they have :improved   Social Determinants of Health:  Lives alone   Dispostion:  After consideration of the diagnostic results and the patients response to treatment, I feel that the patent would benefit from  rehab.  TOC and PT consults pending.          Final Clinical Impression(s) / ED Diagnoses Final diagnoses:  ESRD on hemodialysis [facility])  Physical deconditioning  Diarrhea, unspecified type    Rx / DC Orders ED Discharge Orders     None         Jacalyn Lefevre, MD 09/18/22 [phone]

## 2022-09-18 NOTE — TOC Initial Note (Signed)
Transition of Care [facility]) - Initial/Assessment Note    Patient Details  Name: Tracey Stevens MRN: [id number] Date of Birth: [birth year]  Transition of Care [facility]) CM/SW Contact:    Susa Simmonds, LCSWA Phone Number: 09/18/2022, 5:13 PM  Clinical Narrative: CSW spoke with patient at bedside who stated she would be interested in SNF placement if recommended. Patient stated the only place she doesn't want to go is [facility] but if that's the only facility that accepts her she is willing to go. Patient states she goes to [facility] MWF at 11:00 AM for dialysis.          Expected Discharge Plan: Skilled Nursing Facility Barriers to Discharge: Continued Medical Work up   Patient Goals and CMS Choice Patient states their goals for this hospitalization and ongoing recovery are:: Return home          Expected Discharge Plan and Services       Living arrangements for the past 2 months: Single Family Home                                      Prior Living Arrangements/Services Living arrangements for the past 2 months: Single Family Home Lives with:: Self Patient language and need for interpreter reviewed:: No        Need for Family Participation in Patient Care: Yes (Comment) Care giver support system in place?: Yes (comment)   Criminal Activity/Legal Involvement Pertinent to Current Situation/Hospitalization: No - Comment as needed  Activities of Daily Living      Permission Sought/Granted                  Emotional Assessment Appearance:: Appears stated age Attitude/Demeanor/Rapport: Engaged Affect (typically observed): Calm Orientation: : Oriented to Self, Oriented to  Time, Oriented to Place, Oriented to Situation Alcohol / Substance Use: Not Applicable Psych Involvement: No (comment)  Admission diagnosis:  weakness Patient Active Problem List   Diagnosis Date Noted   Collapsed vertebra, not elsewhere classified, site unspecified,  subsequent encounter for fracture with routine healing 05/12/2021   Colon cancer screening 05/12/2021   Disorder of adrenal gland, unspecified (HCC) 05/12/2021   Hardening of the aorta (main artery of the heart) (HCC) 05/12/2021   History of primary hyperparathyroidism 05/12/2021   Obesity 05/12/2021   Bilateral foot pain 04/15/2021   Febrile neutropenia (HCC) 03/21/2021   Epiglottitis 03/21/2021   Sepsis (HCC) 03/21/2021   Conductive hearing loss, unilateral, left ear, 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 12/23/2020   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   ESRD (end stage renal disease) (HCC) 12/20/2020   ALL (acute lymphoblastic leukemia) (HCC) 12/12/2020   AKI (acute kidney injury) (HCC) 12/12/2020   SIRS (systemic inflammatory response syndrome) (HCC) 12/12/2020   Bilateral lower extremity edema 10/10/2020   Elevated glucose 10/09/2020   Peripheral edema 10/09/2020   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   Lymphocytosis 09/11/2020   CKD (chronic kidney disease) stage 5, GFR less than 15 ml/min (HCC) 09/11/2020   Anemia 09/11/2020   Dysuria 09/11/2020   Diabetes mellitus without complication (HCC) 08/30/2020   Hypercalcemia 06/25/2020   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/2019   Chronic pain of both knees 03/23/2019   Hyperlipidemia 03/23/2019   Trochanteric bursitis of left hip 03/23/2019   Vitamin D deficiency 09/22/2018   Foraminal stenosis of lumbar region 03/18/2017   Osteoarthritis of both hips 02/15/2017  Sciatic leg pain 02/02/2017   Deafness in left ear 12/09/2015   Essential hypertension 12/09/2015   Vertigo 10/14/2015   Lymphoma in remission [facility])    PCP:  Tracey Harries, MD Pharmacy:   [facility] [id number] Ginette Otto, [place] - [address] AT [facility] [address] [place] [address] Phone: [phone] Fax: [phone]  [facility] - [place], [place] -  [address] [place] [address] Phone: [phone] Fax: [phone]     Social Determinants of Health (SDOH) Social History: SDOH Screenings   Tobacco Use: Low Risk  (09/13/2022)   SDOH Interventions:     Readmission Risk Interventions     No data to display

## 2022-09-18 NOTE — [place] FL2 (Signed)
[place] MEDICAID FL2 LEVEL OF CARE FORM     IDENTIFICATION  Patient Name: Tracey Stevens Birthdate: 1960-07-31 Sex: female Admission Date (Current Location): 09/18/2022  [facility] and [place] Number:  Producer, television/film/video and Address:  [facility], [address] [place], [place] [address]      Provider Number: [id number]  Attending Physician Name and Address:  Default, Provider, MD  Relative Name and Phone Number:  Rossalyn, Courtade (Daughter)  [phone]    Current Level of Care: Hospital Recommended Level of Care: Skilled Nursing Facility Prior Approval Number:    Date Approved/Denied:   PASRR Number: [id number]  Discharge Plan: SNF    Current Diagnoses: Patient Active Problem List   Diagnosis Date Noted   Collapsed vertebra, not elsewhere classified, site unspecified, subsequent encounter for fracture with routine healing 05/12/2021   Colon cancer screening 05/12/2021   Disorder of adrenal gland, unspecified (HCC) 05/12/2021   Hardening of the aorta (main artery of the heart) (HCC) 05/12/2021   History of primary hyperparathyroidism 05/12/2021   Obesity 05/12/2021   Bilateral foot pain 04/15/2021   Febrile neutropenia (HCC) 03/21/2021   Epiglottitis 03/21/2021   Sepsis (HCC) 03/21/2021   Conductive hearing loss, unilateral, left ear, 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 12/23/2020   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   ESRD (end stage renal disease) (HCC) 12/20/2020   ALL (acute lymphoblastic leukemia) (HCC) 12/12/2020   AKI (acute kidney injury) (HCC) 12/12/2020   SIRS (systemic inflammatory response syndrome) (HCC) 12/12/2020   Bilateral lower extremity edema 10/10/2020   Elevated glucose 10/09/2020   Peripheral edema 10/09/2020   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   Lymphocytosis 09/11/2020   CKD (chronic kidney disease) stage 5, GFR less than 15 ml/min (HCC) 09/11/2020   Anemia 09/11/2020   Dysuria  09/11/2020   Diabetes mellitus without complication (HCC) 08/30/2020   Hypercalcemia 06/25/2020   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/2019   Chronic pain of both knees 03/23/2019   Hyperlipidemia 03/23/2019   Trochanteric bursitis of left hip 03/23/2019   Vitamin D deficiency 09/22/2018   Foraminal stenosis of lumbar region 03/18/2017   Osteoarthritis of both hips 02/15/2017   Sciatic leg pain 02/02/2017   Deafness in left ear 12/09/2015   Essential hypertension 12/09/2015   Vertigo 10/14/2015   Lymphoma in remission (HCC)     Orientation RESPIRATION BLADDER Height & Weight     Self, Time, Situation, Place  Normal Continent Weight: 230 lb (104.3 kg) Height:  6\' 1"  (185.4 cm)  BEHAVIORAL SYMPTOMS/MOOD NEUROLOGICAL BOWEL NUTRITION STATUS      Continent Diet (Renal with fluid restriction)  AMBULATORY STATUS COMMUNICATION OF NEEDS Skin   Extensive Assist Verbally Normal                       Personal Care Assistance Level of Assistance  Bathing, Feeding, Dressing Bathing Assistance: Limited assistance Feeding assistance: Independent Dressing Assistance: Limited assistance     Functional Limitations Info  Sight, Speech, Hearing Sight Info: Adequate Hearing Info: Adequate Speech Info: Adequate    SPECIAL CARE FACTORS FREQUENCY                       Contractures Contractures Info: Not present    Additional Factors Info  Code Status, Allergies Code Status Info: Full Allergies Info: Amlodipine, Methotrexate           Current Medications (09/18/2022):  This is the current  hospital active medication list Current Facility-Administered Medications  Medication Dose Route Frequency Provider Last Rate Last Admin   atorvastatin (LIPITOR) tablet 20 mg  20 mg Oral QHS Jacalyn Lefevre, MD       [START ON 09/19/2022] Chlorhexidine Gluconate Cloth 2 % PADS 6 each  6 each Topical Q0600 Tomasa Blase, PA-C       cinacalcet [facility]) tablet 30 mg  30  mg Oral Daily Jacalyn Lefevre, MD   30 mg at 09/18/22 1759   dapsone tablet 100 mg  100 mg Oral Daily Jacalyn Lefevre, MD   100 mg at 09/18/22 1800   Darbepoetin Alfa (ARANESP) injection 100 mcg  100 mcg Subcutaneous Q Fri-1800 Ejigiri, Ogechi Grace, PA-C       diclofenac Sodium (VOLTAREN) 1 % topical gel 2 g  2 g Topical Daily PRN Jacalyn Lefevre, MD       gabapentin (NEURONTIN) capsule 300 mg  300 mg Oral QHS Jacalyn Lefevre, MD       metoprolol succinate (TOPROL-XL) 24 hr tablet 50 mg  50 mg Oral BID Jacalyn Lefevre, MD       traMADol Janean Sark) tablet 50 mg  50 mg Oral TID PRN Jacalyn Lefevre, MD       Current Outpatient Medications  Medication Sig Dispense Refill   acyclovir (ZOVIRAX) 400 MG tablet Take 1 tablet (400 mg) by mouth once daily after 5 pm. Take while neutropenic as directed by oncologist.     atorvastatin (LIPITOR) 20 MG tablet Take 20 mg by mouth daily.     benzonatate (TESSALON) 100 MG capsule Take 1 capsule by mouth 3 (three) times daily as needed for cough.     cinacalcet (SENSIPAR) 30 MG tablet Take 30 mg by mouth daily.     dapsone 100 MG tablet Take 100 mg by mouth daily.     diclofenac Sodium (VOLTAREN) 1 % GEL Apply 2 g topically daily as needed (pain).     docusate sodium (COLACE) 100 MG capsule Take 1 capsule (100 mg total) by mouth 2 (two) times daily as needed for mild constipation. (Patient not taking: Reported on 06/03/2021) 10 capsule 0   gabapentin (NEURONTIN) 300 MG capsule Take 300 mg by mouth at bedtime.     labetalol (NORMODYNE) 5 MG/ML injection Inject 2 mLs (10 mg total) into the vein every 2 (two) hours as needed (SBP > 180 or DBP > 100; HR must be > 70 to administer). (Patient not taking: Reported on 06/03/2021) 20 mL    lidocaine, PF, (XYLOCAINE) 1 % SOLN injection Inject 5 mLs into the skin as needed (topical anesthesia for hemodialysis if GEBAUERS is ineffective.). (Patient not taking: Reported on 06/03/2021) 1 mL 0   lidocaine-prilocaine (EMLA) cream  Apply 1 application topically as needed (topical anesthesia for hemodialysis if Gebauers and Lidocaine injection are ineffective.). (Patient not taking: Reported on 06/03/2021) 30 g 0   magic mouthwash w/lidocaine SOLN Take 5 mLs by mouth 3 (three) times daily as needed for mouth pain. (Patient not taking: Reported on 06/03/2021)  0   metoprolol succinate (TOPROL-XL) 50 MG 24 hr tablet Take 50 mg by mouth 2 (two) times daily.     Mouthwashes (MOUTH RINSE) LIQD solution 15 mLs by Mouth Rinse route 2 (two) times daily. (Patient not taking: Reported on 06/03/2021)  0   ondansetron (ZOFRAN) 4 MG/2ML SOLN injection Inject 2 mLs (4 mg total) into the vein every 8 (eight) hours as needed for nausea or vomiting. (Patient not taking:  Reported on 06/03/2021) 2 mL 0   pantoprazole (PROTONIX) 40 MG injection Inject 40 mg into the vein daily. (Patient not taking: Reported on 06/03/2021) 1 each    pentafluoroprop-tetrafluoroeth (GEBAUERS) AERO Apply 1 application topically as needed (topical anesthesia for hemodialysis). (Patient not taking: Reported on 06/03/2021)  0   polyethylene glycol (MIRALAX / GLYCOLAX) 17 g packet Take 17 g by mouth daily as needed for moderate constipation. (Patient not taking: Reported on 06/03/2021) 14 each 0   Tbo-Filgrastim (GRANIX) 480 MCG/0.8ML SOSY injection Inject 0.8 mLs (480 mcg total) into the skin daily at 6 (six) AM. (Patient not taking: Reported on 06/03/2021) 1 mL 0   traMADol (ULTRAM) 50 MG tablet Take 50 mg by mouth 3 (three) times daily as needed for severe pain.       Discharge Medications: Please see discharge summary for a list of discharge medications.  Relevant Imaging Results:  Relevant Lab Results:   Additional Information SSN [id number], [facility] MWF at 11:00 AM for dialysis.  Susa Simmonds, LCSWA

## 2022-09-18 NOTE — ED Notes (Signed)
Patient signed paper consent form accepting dialysis tonight

## 2022-09-19 DIAGNOSIS — R5381 Other malaise: Secondary | ICD-10-CM | POA: Diagnosis not present

## 2022-09-19 NOTE — Discharge Instructions (Signed)
Continue your current medications.  Follow-up with your doctor as planned

## 2022-09-19 NOTE — ED Provider Notes (Addendum)
Emergency Medicine Observation Re-evaluation Note  Tracey Stevens is a 62 y.o. female,  Pt initially presented to the ED for complaints of Dizziness (Pt coming from home with recurrent dizziness, weakness. Pt is tachypneic, intermittent shob, dialysis patient. Denies cp) Currently, the patient is resting in no acute distress  Physical Exam  BP 125/83 (BP Location: Right Arm)   Pulse 66   Temp 97.7 F (36.5 C) (Oral)   Resp 20   Ht 1.854 m (6\' 1" )   Wt 104.3 kg   SpO2 93%   BMI 30.34 kg/m  Physical Exam General: No acute distress   ED Course / MDM  EKG:EKG Interpretation Date/Time:  Friday September 18 2022 10:51:29 EDT Ventricular Rate:  83 PR Interval:  202 QRS Duration:  126 QT Interval:  393 QTC Calculation: 462 R Axis:   -54  Text Interpretation: Sinus rhythm Left bundle branch block Since last tracing rate slower LBBB is old Confirmed by Jacalyn Lefevre [phone]) on 09/18/2022 11:17:22 AM  I have reviewed the labs performed to date as well as medications administered while in observation.  Recent changes in the last 24 hours include   Plan  Patient had recently been in the hospital.  Recommendation was for nursing placement.  Patient is feeling too weak to manage at home did not go to dialysis initially and came to the ED.  Patient was dialyzed without difficulty.  Current plan is for SNF placement   Linwood Dibbles, MD 09/19/22 0813 Notified by TOCThat the patient is going to be going home with additional home health assistance   Linwood Dibbles, MD 09/19/22 1117

## 2022-09-19 NOTE — ED Notes (Signed)
PTAR here at [facility]

## 2022-09-19 NOTE — Progress Notes (Signed)
Physical Therapy Evaluation Patient Details Name: Tracey Stevens MRN: [id number] DOB: [birth year] Today's Date: 09/19/2022  History of Present Illness  62 yo female admitted with R LE weakness. Pt reports she was in her shower at home and couldn't get out. Recent d/c [facility] SN[facility]. Hx of recent R medial malleolus fx, R knee effusion, ALL, anemia, CHF, cervica Ca,lymphoma, ESRD, vertigo, DM   Clinical Impression  Tracey Stevens is 62 y.o. female admitted with above HPI and diagnosis. Patient is currently limited by functional impairments below (see PT problem list). Patient lives alone and is mod I with rollator for mobility and has home aids 2 hrs/day through insurance to assist with ADL's. Overall pt mobilizing at supervision to Mod I level with bed mobility, transfers, and gait with RW. Pt amb ~80' with no LOB and safe walker management. She reports difficulty with toileting and self care tasks at home. Patient will benefit from continued skilled PT interventions to address impairments and progress independence with mobility. Acute PT will follow and progress as able.         Assistance Recommended at Discharge Intermittent Supervision/Assistance  If plan is discharge home, recommend the following:  Can travel by private vehicle  Assist for transportation;Assistance with cooking/housework;Help with stairs or ramp for entrance;Direct supervision/assist for medications management        Equipment Recommendations None recommended by PT  Recommendations for Other Services       Functional Status Assessment Patient has had a recent decline in their functional status and demonstrates the ability to make significant improvements in function in a reasonable and predictable amount of time.     Precautions / Restrictions Precautions Precautions: Fall Restrictions Weight Bearing Restrictions: Yes RLE Weight Bearing: Weight bearing as tolerated (in CAM Boot)      Mobility  Bed Mobility Overal  bed mobility: Needs Assistance Bed Mobility: Supine to Sit, Sit to Supine     Supine to sit: Supervision Sit to supine: Supervision   General bed mobility comments: sup for safety, pt able to bring Bil LE off/on EOB even with heavy CAM boot in place.    Transfers Overall transfer level: Needs assistance Equipment used: None Transfers: Sit to/from Stand Sit to Stand: Supervision           General transfer comment: pt reliant on UE use to power up and control lower to EOB.    Ambulation/Gait Ambulation/Gait assistance: Supervision Gait Distance (Feet): 80 Feet Assistive device: Rolling walker (2 wheels) Gait Pattern/deviations: Step-through pattern, Decreased step length - right, Decreased step length - left, Decreased stride length Gait velocity: decr     General Gait Details: slow but stedy gait, no buckling or LOB noted. pt denied fatigue or dizziness.  Stairs            Wheelchair Mobility     Tilt Bed    Modified Rankin (Stroke Patients Only)       Balance Overall balance assessment: Needs assistance Sitting-balance support: Feet supported, No upper extremity supported Sitting balance-Leahy Scale: Good     Standing balance support: During functional activity, Reliant on assistive device for balance Standing balance-Leahy Scale: Fair                               Pertinent Vitals/Pain Pain Assessment Pain Assessment: No/denies pain    Home Living Family/patient expects to be discharged to:: Private residence Living Arrangements: Alone Available Help at Discharge: Neighbor  Type of Home: Apartment Home Access: Level entry       Home Layout: One level Home Equipment: Wheelchair - power;Wheelchair - Forensic psychologist (2 wheels);Cane - single point;Rollator (4 wheels);Tub bench;BSC/3in1 (BSC over toilet) Additional Comments: She goes to dialysis and/or chemo 4 days per week.    Prior Function Prior Level of Function :  Independent/Modified Independent;Needs assist             Mobility Comments: pt has a rollator and a lift chair. sometimes sleeps in lift chair. spends most of her day in lift chair but is able to walk to bathroom and kitchen as needed. (She used a rollator for ambulation.) ADLs Comments: aids help 2 hours/day M-F and pt has two different aids on weekends for 2 hours Sat/Sun. Aids assist her with bathing, when they aren't there pt just does sink bath. pt microwaves meals, daughter premakes the meals for her. and has a friend across the street that checks on her. aid is covered through insurance. (She was modified independent to independent / ADLs, she does not drive, & she had a home health aide 7 days per week for 2 hours each day who assisted with cleaning & laundry. she recently got approved for 10 more hours per week with aid services.)     Hand Dominance   Dominant Hand: Right    Extremity/Trunk Assessment   Upper Extremity Assessment Upper Extremity Assessment: Overall WFL for tasks assessed    Lower Extremity Assessment Lower Extremity Assessment: Generalized weakness    Cervical / Trunk Assessment Cervical / Trunk Assessment: Other exceptions Cervical / Trunk Exceptions: habitus  Communication   Communication: No difficulties  Cognition Arousal/Alertness: Awake/alert Behavior During Therapy: WFL for tasks assessed/performed Overall Cognitive Status: Within Functional Limits for tasks assessed                                 General Comments: Oriented x4, able to follow commands without difficulty        General Comments      Exercises     Assessment/Plan    PT Assessment Patient needs continued PT services  PT Problem List Decreased strength;Decreased balance;Decreased range of motion;Decreased mobility;Decreased activity tolerance;Decreased knowledge of use of DME       PT Treatment Interventions DME instruction;Gait training;Functional  mobility training;Therapeutic activities;Balance training;Patient/family education;Therapeutic exercise    PT Goals (Current goals can be found in the Care Plan section)  Acute Rehab PT Goals Patient Stated Goal: pt prefers to return home with increased aid support PT Goal Formulation: With patient Time For Goal Achievement: 10/03/22 Potential to Achieve Goals: Good    Frequency Min 3X/week     Co-evaluation               AM-PAC PT "6 Clicks" Mobility  Outcome Measure Help needed turning from your back to your side while in a flat bed without using bedrails?: None Help needed moving from lying on your back to sitting on the side of a flat bed without using bedrails?: None Help needed moving to and from a bed to a chair (including a wheelchair)?: None Help needed standing up from a chair using your arms (e.g., wheelchair or bedside chair)?: None Help needed to walk in hospital room?: A Little Help needed climbing 3-5 steps with a railing? : A Little 6 Click Score: 22    End of Session Equipment Utilized During Treatment: Gait belt  Activity Tolerance: Patient tolerated treatment well Patient left: in bed;with call bell/phone within reach Nurse Communication: Mobility status PT Visit Diagnosis: Muscle weakness (generalized) (M62.81);Difficulty in walking, not elsewhere classified (R26.2)    Time: 1610-9604 PT Time Calculation (min) (ACUTE ONLY): 24 min   Charges:   PT Evaluation $PT Eval Low Complexity: 1 Low PT Treatments $Gait Training: 8-22 mins PT General Charges $$ ACUTE PT VISIT: 1 Visit         Wynn Maudlin, DPT Acute Rehabilitation Services Office [phone]  09/19/22 10:41 AM

## 2022-09-19 NOTE — Progress Notes (Addendum)
Received patient in bed to unit.  Alert and oriented.  Informed consent signed and in chart.   TX duration:  Patient tolerated well.  Transported back to the room  Alert, without acute distress.  Hand-off given to patient's nurse.   Access used: RIJ HD Catheter Access issues: Lines reversed d/t poor arterial flow  Total UF removed: Medication(s) given: None Post HD VS: BP 125/83 Post HD weight: Unable to obtain   [facility]    09/19/22 0230  Hemodialysis Catheter Left Internal jugular Double lumen Permanent (Tunneled)  No placement date or time found.   Placed prior to admission: Yes  Orientation: Left  Access Location: (c) Internal jugular  Hemodialysis Catheter Type: Double lumen Permanent (Tunneled)  Site Condition No complications  Blue Lumen Status Flushed;Heparin locked;Dead end cap in place  Red Lumen Status Saline locked;Heparin locked;Dead end cap in place  Purple Lumen Status N/A  Catheter fill solution Heparin 1000 units/ml  Catheter fill volume (Arterial) 2.1 cc  Catheter fill volume (Venous) 2.1  Dressing Type Transparent  Dressing Status Antimicrobial disc in place  Drainage Description None  Post treatment catheter status Capped and Clamped  Neurological  Level of Consciousness Alert  Orientation Level Oriented X4  Respiratory  Respiratory Pattern Regular;Labored  Bilateral Breath Sounds Clear  Cardiac  Pulse Regular  Psychosocial  Psychosocial (WDL) WDL

## 2022-09-19 NOTE — Discharge Planning (Signed)
Licensed Clinical Social Worker is seeking post-discharge placement for this patient at the following level of care: skilled nursing facility    

## 2022-09-19 NOTE — Progress Notes (Addendum)
10:40am: CSW was notified by PT that patient desires to go home with continued home health (active with [facility]) and will need MD to sign PCS form for patient to increase her aide hours.  CSW notified MD of request.  10am: Patient currently does not have any bed offers. Patient also has not been seen by PT yet.   CSW will continue to follow.  Edwin Dada, MSW, LCSW Transitions of Care  Clinical Social Worker II [phone]

## 2022-09-19 NOTE — ED Notes (Signed)
Patient has arrived to room back from dialysis at this time.

## 2022-09-21 ENCOUNTER — Other Ambulatory Visit: Payer: Self-pay | Admitting: *Deleted

## 2022-09-21 DIAGNOSIS — N186 End stage renal disease: Secondary | ICD-10-CM

## 2022-09-21 LAB — HEPATITIS B SURFACE ANTIBODY, QUANTITATIVE: Hep B S AB Quant (Post): <3.5 m[IU]/mL — ABNORMAL LOW

## 2022-10-06 ENCOUNTER — Ambulatory Visit (HOSPITAL_COMMUNITY): Payer: Medicare HMO | Attending: Vascular Surgery

## 2022-10-06 ENCOUNTER — Ambulatory Visit (HOSPITAL_COMMUNITY): Payer: Medicare HMO

## 2022-10-06 ENCOUNTER — Ambulatory Visit: Payer: Medicare HMO | Admitting: Vascular Surgery

## 2022-12-04 IMAGING — CR DG CHEST 2V
2 series · 2 of 2 positions shown · non-contrast
Comparison: Chest radiograph 03/22/2021.

CLINICAL DATA: Shortness of breath.

EXAM:
CHEST - 2 VIEW

[chest lat]
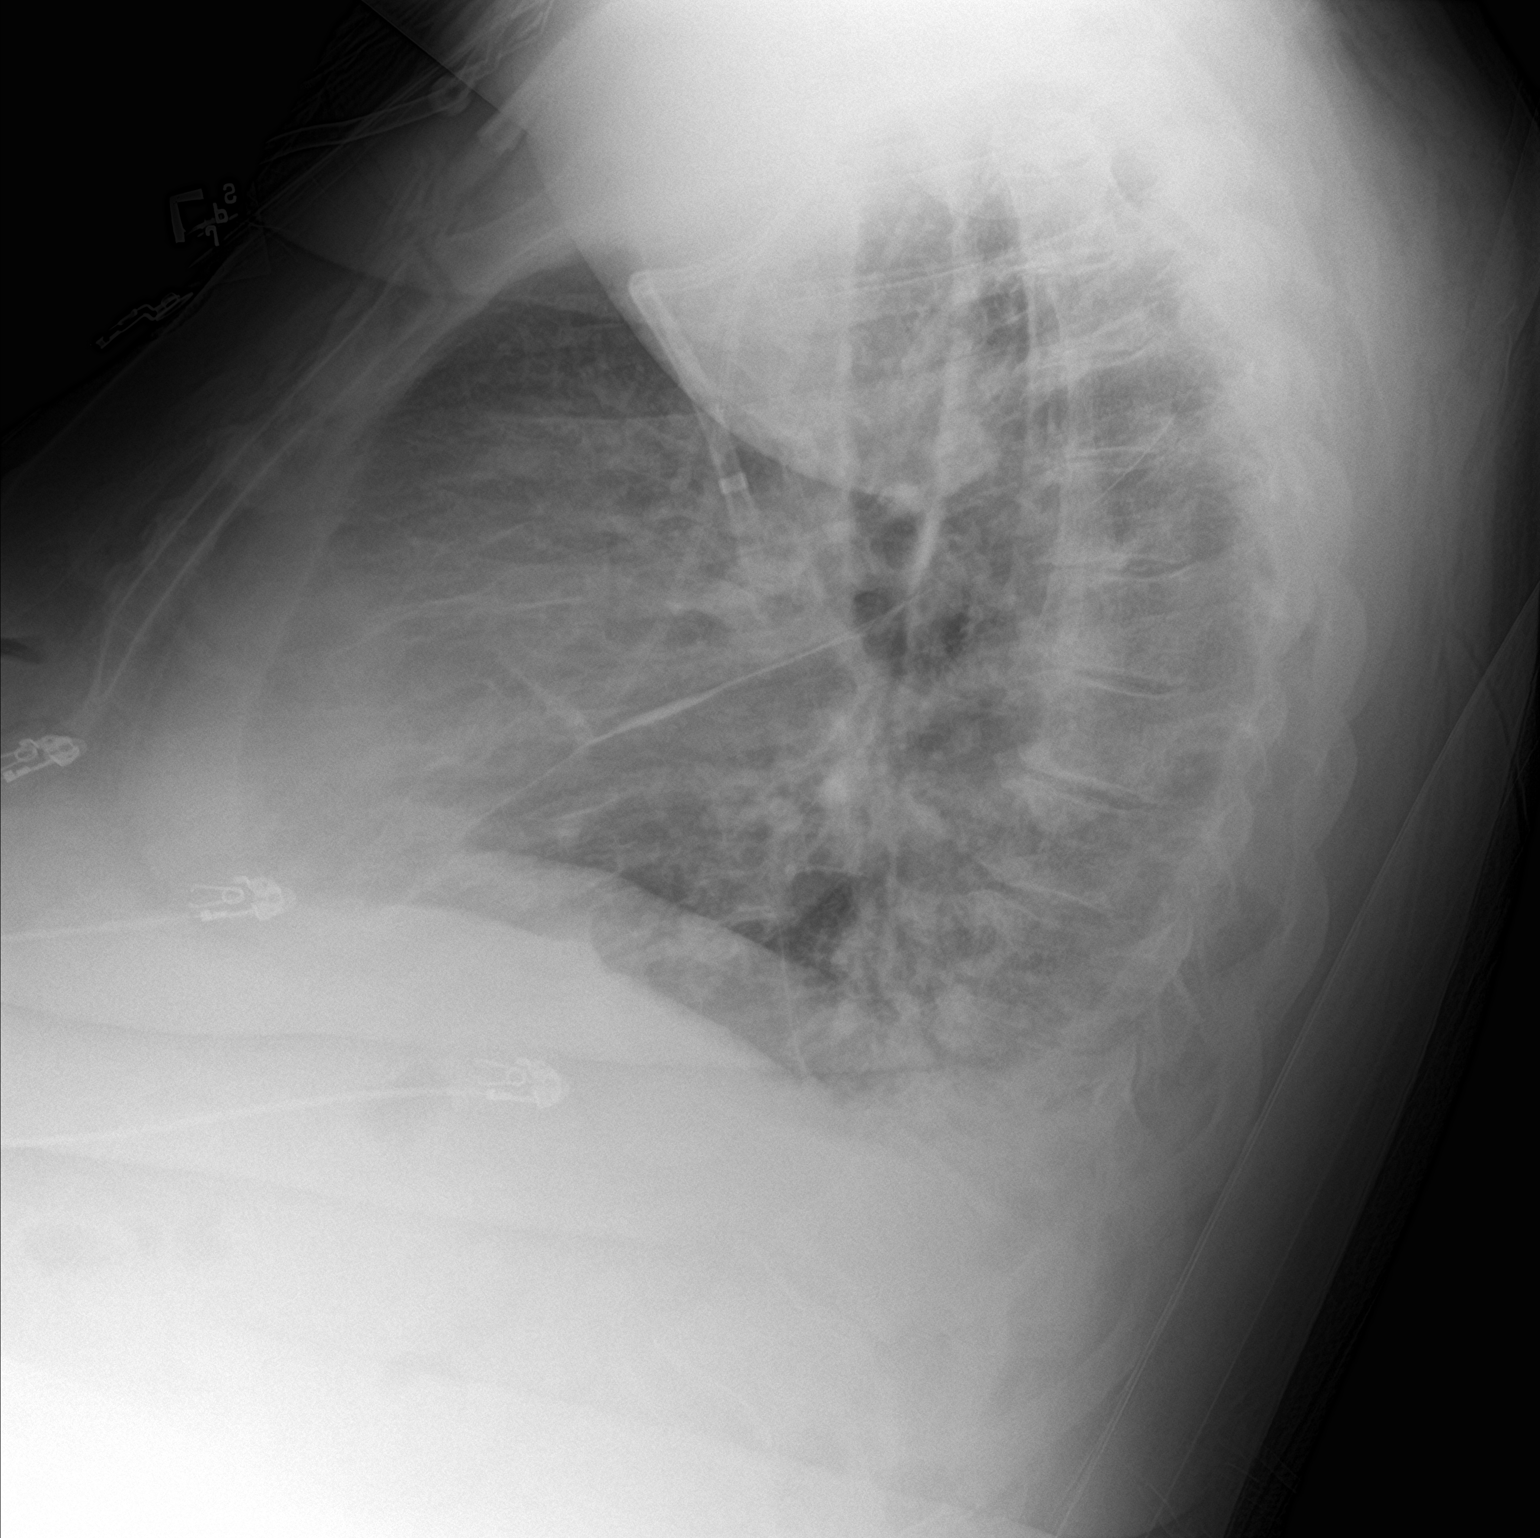

[chest ap]
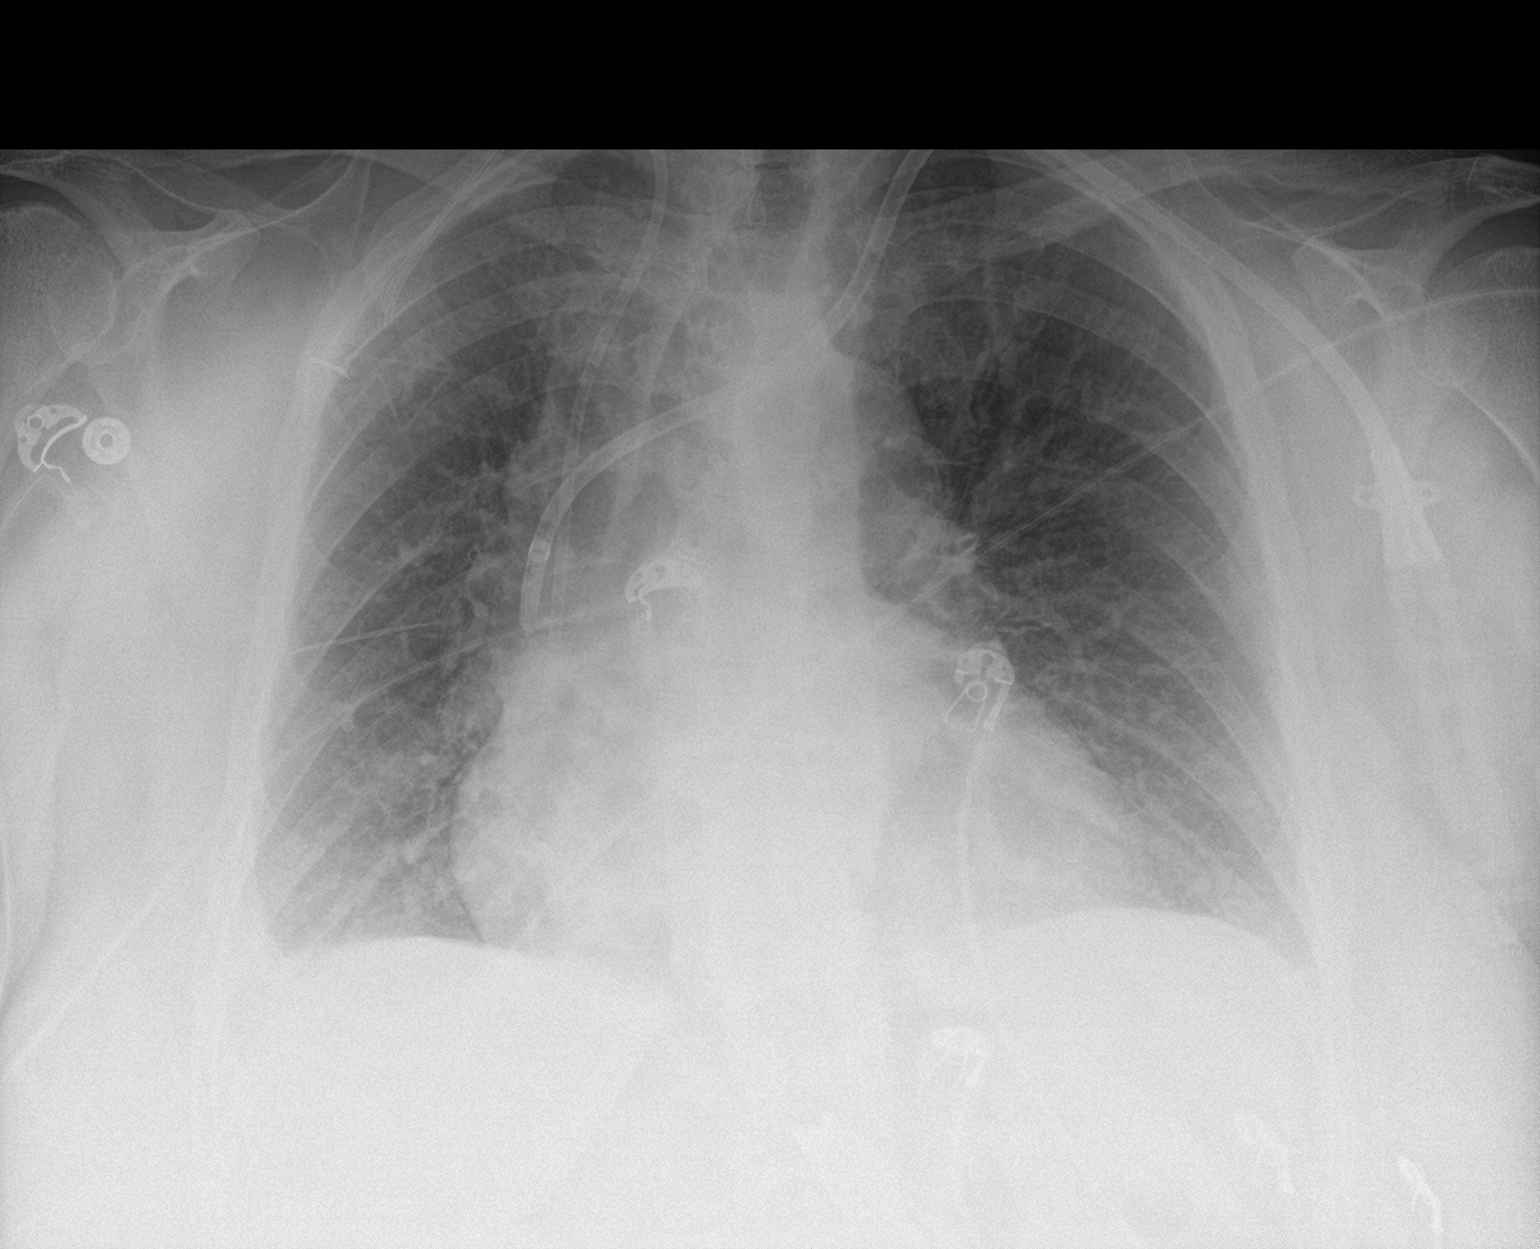

[2 of 2 positions shown; findings below may reference images not displayed]

FINDINGS: No consolidation. Similar enlargement of the cardiac silhouette.
Right IJ Port-A-Cath with tip projecting at the superior right
atrium. Left IJ central venous catheter with tip projects at the
superior cavoatrial junction. Suspected small right pleural
effusion. No visible pneumothorax.
IMPRESSION: 1. Suspected small right pleural effusion. Otherwise, no evidence of
acute cardiopulmonary disease.
2. Cardiomegaly.

## 2022-12-21 NOTE — Progress Notes (Deleted)
[facility] [place]  ASSESSMENT / PLAN: Tracey Stevens is a 62 y.o. *** handed female in need of permanent dialysis access. I reviewed options for dialysis in detail with the patient, including hemodialysis and peritoneal dialysis. I counseled the patient to ask their nephrologist about their candidacy for renal transplant. I counseled the patient that dialysis access requires surveillance and periodic maintenance. Plan to proceed with ***.    CHIEF COMPLAINT: ***  HISTORY OF PRESENT ILLNESS: Tracey Stevens is a 62 y.o. female ***  VASCULAR SURGICAL HISTORY: ***  VASCULAR RISK FACTORS: {FINDINGS; POSITIVE [phone]} history of stroke / transient ischemic attack. {FINDINGS; POSITIVE [phone]} history of coronary artery disease. *** history of PCI. *** history of CABG.  {FINDINGS; POSITIVE [phone]} history of diabetes mellitus. Last A1c ***. {FINDINGS; POSITIVE [phone]} history of smoking. *** actively smoking. {FINDINGS; POSITIVE [phone]} history of hypertension. *** drug regimen with *** control. {FINDINGS; POSITIVE [phone]} history of chronic kidney disease.  Last GFR ***. CKD {stage:30421363}. {FINDINGS; POSITIVE [phone]} history of chronic obstructive pulmonary disease, treated with ***.  FUNCTIONAL STATUS: ECOG performance status: {findings; ecog performance status:31780} Ambulatory status: {TNHAmbulation:25868}  CAREY 1 AND 3 YEAR INDEX Female (2pts) 75-79 or 80-84 (2pts) >84 (3pts) Dependence in toileting (1pt) Partial or full dependence in dressing (1pt) History of malignant neoplasm (2pts) CHF (3pts) COPD (1pts) CKD (3pts)  0-3 pts 6% 1 year mortality ; 21% 3 year mortality 4-5 pts 12% 1 year mortality ; 36% 3 year mortality >5 pts 21% 1 year mortality; 54% 3 year mortality   Past Medical History:  Diagnosis Date   Acute lymphoblastic leukemia (ALL) (HCC)    Adrenal  nodule (HCC)    Anemia    Cancer (HCC)    cervical   Congestive heart failure (CHF) (HCC)    Diffuse Large B Cell Lymphoma 2015 remission   Dyspnea    SOB when she over exerts herself   ESRD on dialysis [facility])    M-W-F   Hearing loss    History of blood transfusion    Hyperparathyroidism (HCC)    s/p parathyroidectomy   Hypertension    Hypertension    Multinodular goiter    Multiple renal cysts    Nephrolithiasis    On antineoplastic chemotherapy    Pre-diabetes    Most recent A1C 6.7- 09/13/20. No blood sugar checks   Vertigo    Vitamin D deficiency     Past Surgical History:  Procedure Laterality Date   ABDOMINAL HYSTERECTOMY     BREAST BIOPSY Right over 10 years ago   benign   Left ear surgery     LIVER BIOPSY  2014   confirmed to be DLBCL   PARATHYROIDECTOMY     TUBAL LIGATION      Family History  Problem Relation Age of Onset   Heart disease Mother    Hypertension Mother    Gastric cancer Father    Renal Disease Father    Cancer Maternal Grandmother    Leukemia Maternal Grandfather    Cancer Paternal Grandmother    Cancer Paternal Grandfather     Social History   Socioeconomic History   Marital status: Single    Spouse name: Not on file   Number of children: 4   Years of education: Not on file   Highest education level: 12th grade  Occupational History   Occupation: disabled  Tobacco Use   Smoking status: Never   Smokeless tobacco: Never  Vaping Use   Vaping  status: Never Used  Substance and Sexual Activity   Alcohol use: Yes    Comment: occ   Drug use: No   Sexual activity: Not on file  Other Topics Concern   Not on file  Social History Narrative   Pt is right handed. She is single, lives alone in a 2nd floor apartment. She drinks 2-3 glasses of tea a day. She is active.   Social Determinants of Health   Financial Resource Strain: Low Risk  (10/04/2021)   Received from [facility], [facility]   Overall Financial Resource Strain  (CARDIA)    Difficulty of Paying Living Expenses: Not hard at all  Food Insecurity: High Risk (11/28/2022)   Received from [facility]   Hunger Vital Sign    Worried About Running Out of Food in the Last Year: Never true    Ran Out of Food in the Last Year: Often true  Transportation Needs: Unmet Transportation Needs (11/28/2022)   Received from [facility]    In the past 12 months, has lack of reliable transportation kept you from medical appointments, meetings, work or from getting things needed for daily living? : Yes  Physical Activity: Insufficiently Active (10/04/2021)   Received from [facility], [facility]   Exercise Vital Sign    Days of Exercise per Week: 2 days    Minutes of Exercise per Session: 20 min  Stress: No Stress Concern Present (10/04/2021)   Received from [facility], [facility] of Occupational Health - Occupational Stress Questionnaire    Feeling of Stress : Not at all  Social Connections: Unknown (10/10/2022)   Received from [facility]   Social Network    Social Network: Not on file  Intimate Partner Violence: Unknown (10/10/2022)   Received from [facility]   HITS    Physically Hurt: Not on file    Insult or Talk Down To: Not on file    Threaten Physical Harm: Not on file    Scream or Curse: Not on file    Allergies  Allergen Reactions   Other Other (See Comments)    Transparent dressing - skin tear   Amlodipine Swelling    Peripheral edema, ankles swell    Methotrexate Dermatitis    Current Outpatient Medications  Medication Sig Dispense Refill   acyclovir (ZOVIRAX) 400 MG tablet Take 1 tablet (400 mg) by mouth once daily after 5 pm. Take while neutropenic as directed by oncologist.     atorvastatin (LIPITOR) 20 MG tablet Take 20 mg by mouth daily.     benzonatate (TESSALON) 100 MG capsule Take 1 capsule by mouth 3 (three) times daily as needed for cough.     cinacalcet (SENSIPAR) 30 MG tablet  Take 30 mg by mouth daily.     dapsone 100 MG tablet Take 100 mg by mouth daily.     diclofenac Sodium (VOLTAREN) 1 % GEL Apply 2 g topically daily as needed (pain).     docusate sodium (COLACE) 100 MG capsule Take 1 capsule (100 mg total) by mouth 2 (two) times daily as needed for mild constipation. (Patient not taking: Reported on 06/03/2021) 10 capsule 0   gabapentin (NEURONTIN) 300 MG capsule Take 300 mg by mouth at bedtime.     labetalol (NORMODYNE) 5 MG/ML injection Inject 2 mLs (10 mg total) into the vein every 2 (two) hours as needed (SBP > 180 or DBP > 100; HR must be > 70 to  administer). (Patient not taking: Reported on 06/03/2021) 20 mL    lidocaine, PF, (XYLOCAINE) 1 % SOLN injection Inject 5 mLs into the skin as needed (topical anesthesia for hemodialysis if GEBAUERS is ineffective.). (Patient not taking: Reported on 06/03/2021) 1 mL 0   lidocaine-prilocaine (EMLA) cream Apply 1 application topically as needed (topical anesthesia for hemodialysis if Gebauers and Lidocaine injection are ineffective.). (Patient not taking: Reported on 06/03/2021) 30 g 0   magic mouthwash w/lidocaine SOLN Take 5 mLs by mouth 3 (three) times daily as needed for mouth pain. (Patient not taking: Reported on 06/03/2021)  0   metoprolol succinate (TOPROL-XL) 50 MG 24 hr tablet Take 50 mg by mouth 2 (two) times daily.     Mouthwashes (MOUTH RINSE) LIQD solution 15 mLs by Mouth Rinse route 2 (two) times daily. (Patient not taking: Reported on 06/03/2021)  0   ondansetron (ZOFRAN) 4 MG/2ML SOLN injection Inject 2 mLs (4 mg total) into the vein every 8 (eight) hours as needed for nausea or vomiting. (Patient not taking: Reported on 06/03/2021) 2 mL 0   pantoprazole (PROTONIX) 40 MG injection Inject 40 mg into the vein daily. (Patient not taking: Reported on 06/03/2021) 1 each    pentafluoroprop-tetrafluoroeth (GEBAUERS) AERO Apply 1 application topically as needed (topical anesthesia for hemodialysis). (Patient not taking:  Reported on 06/03/2021)  0   polyethylene glycol (MIRALAX / GLYCOLAX) 17 g packet Take 17 g by mouth daily as needed for moderate constipation. (Patient not taking: Reported on 06/03/2021) 14 each 0   Tbo-Filgrastim (GRANIX) 480 MCG/0.8ML SOSY injection Inject 0.8 mLs (480 mcg total) into the skin daily at 6 (six) AM. (Patient not taking: Reported on 06/03/2021) 1 mL 0   traMADol (ULTRAM) 50 MG tablet Take 50 mg by mouth 3 (three) times daily as needed for severe pain.     No current facility-administered medications for this visit.    PHYSICAL EXAM There were no vitals filed for this visit.  Constitutional: *** appearing. *** distress. Appears *** nourished.  Neurologic: CN ***. *** focal findings. *** sensory loss. Psychiatric: *** Mood and affect symmetric and appropriate. Eyes: *** No icterus. No conjunctival pallor. Ears, nose, throat: *** mucous membranes moist. Midline trachea.  Cardiac: *** rate and rhythm.  Respiratory: *** unlabored. Abdominal: *** soft, non-tender, non-distended.  Peripheral vascular: *** Extremity: *** edema. *** cyanosis. *** pallor.  Skin: *** gangrene. *** ulceration.  Lymphatic: *** Stemmer's sign. *** palpable lymphadenopathy.    PERTINENT LABORATORY AND RADIOLOGIC DATA  Most recent CBC    Latest Ref Rng & Units 09/18/2022   10:35 AM 09/13/2022    8:19 AM 09/07/2022    8:28 PM  CBC  WBC 4.0 - 10.5 K/uL 4.0  8.0    Hemoglobin 12.0 - 15.0 g/dL 8.4  9.1  9.5   Hematocrit 36.0 - 46.0 % 26.8  29.5  28.0   Platelets 150 - 400 K/uL 117  146       Most recent CMP    Latest Ref Rng & Units 09/18/2022   10:35 AM 09/13/2022    8:19 AM 09/07/2022    8:28 PM  CMP  Glucose 70 - 99 mg/dL 79  88    BUN 8 - 23 mg/dL 42  41    Creatinine 2.95 - 1.00 mg/dL 28.41  3.24    Sodium 401 - 145 mmol/L 140  141  138   Potassium 3.5 - 5.1 mmol/L 3.7  3.8  3.3   Chloride 98 -  111 mmol/L 102  105    CO2 22 - 32 mmol/L 23  25    Calcium 8.9 - 10.3 mg/dL 9.1  16.1     Total Protein 6.5 - 8.1 g/dL 4.9     Total Bilirubin 0.3 - 1.2 mg/dL 1.0     Alkaline Phos 38 - 126 U/L 97     AST 15 - 41 U/L 10     ALT 0 - 44 U/L 9       Renal function CrCl cannot be calculated (Patient's most recent lab result is older than the maximum 21 days allowed.).  No results found for: "HGBA1C"  No results found for: "LDLCALC", "LDLC", "HIRISKLDL", "POCLDL", "LDLDIRECT", "REALLDLC", "TOTLDLC"   Vascular Imaging: ***  Leilanee Righetti N. Lenell Antu, MD FACS [facility] Phone Number: [phone] 12/21/2022 11:36 AM   Total time spent on preparing this encounter including chart review, data review, collecting history, examining the patient, coordinating care for this {tnhtimebilling:26202}  Portions of this report may have been transcribed using voice recognition software.  Every effort has been made to ensure accuracy; however, inadvertent computerized transcription errors may still be present.

## 2022-12-22 ENCOUNTER — Ambulatory Visit: Payer: Medicare HMO | Admitting: Vascular Surgery

## 2022-12-22 ENCOUNTER — Ambulatory Visit (HOSPITAL_COMMUNITY): Payer: Medicare HMO | Attending: Vascular Surgery

## 2022-12-22 ENCOUNTER — Ambulatory Visit (HOSPITAL_COMMUNITY): Admission: RE | Admit: 2022-12-22 | Payer: Medicare HMO | Source: Ambulatory Visit

## 2023-02-08 ENCOUNTER — Emergency Department (HOSPITAL_COMMUNITY)
Admission: EM | Admit: 2023-02-08 | Discharge: 2023-02-09 | Disposition: A | Discharge status: Home / Self Care | Payer: Medicare HMO | Attending: Student | Admitting: Student

## 2023-02-08 ENCOUNTER — Emergency Department (HOSPITAL_COMMUNITY): Payer: Medicare HMO

## 2023-02-08 ENCOUNTER — Other Ambulatory Visit: Payer: Self-pay

## 2023-02-08 DIAGNOSIS — I509 Heart failure, unspecified: Secondary | ICD-10-CM | POA: Insufficient documentation

## 2023-02-08 DIAGNOSIS — Z992 Dependence on renal dialysis: Secondary | ICD-10-CM | POA: Diagnosis not present

## 2023-02-08 DIAGNOSIS — D649 Anemia, unspecified: Secondary | ICD-10-CM | POA: Diagnosis not present

## 2023-02-08 DIAGNOSIS — I132 Hypertensive heart and chronic kidney disease with heart failure and with stage 5 chronic kidney disease, or end stage renal disease: Secondary | ICD-10-CM | POA: Diagnosis not present

## 2023-02-08 DIAGNOSIS — Z8541 Personal history of malignant neoplasm of cervix uteri: Secondary | ICD-10-CM | POA: Diagnosis not present

## 2023-02-08 DIAGNOSIS — E1122 Type 2 diabetes mellitus with diabetic chronic kidney disease: Secondary | ICD-10-CM | POA: Insufficient documentation

## 2023-02-08 DIAGNOSIS — N186 End stage renal disease: Secondary | ICD-10-CM | POA: Diagnosis not present

## 2023-02-08 DIAGNOSIS — R5383 Other fatigue: Secondary | ICD-10-CM | POA: Diagnosis present

## 2023-02-08 DIAGNOSIS — Z79899 Other long term (current) drug therapy: Secondary | ICD-10-CM | POA: Insufficient documentation

## 2023-02-08 DIAGNOSIS — I959 Hypotension, unspecified: Secondary | ICD-10-CM | POA: Diagnosis not present

## 2023-02-08 LAB — CBC WITH DIFFERENTIAL/PLATELET
Abs Immature Granulocytes: 0.02 10*3/uL (ref 0.00–0.07)
Basophils Absolute: 0 10*3/uL (ref 0.0–0.1)
Basophils Relative: 0 %
Eosinophils Absolute: 0 10*3/uL (ref 0.0–0.5)
Eosinophils Relative: 0 %
HCT: 17.8 % — ABNORMAL LOW (ref 36.0–46.0)
Hemoglobin: 5.6 g/dL — CL (ref 12.0–15.0)
Immature Granulocytes: 0 %
Lymphocytes Relative: 46 %
Lymphs Abs: 3.4 10*3/uL (ref 0.7–4.0)
MCH: 30.1 pg (ref 26.0–34.0)
MCHC: 31.5 g/dL (ref 30.0–36.0)
MCV: 95.7 fL (ref 80.0–100.0)
Monocytes Absolute: 3.7 10*3/uL — ABNORMAL HIGH (ref 0.1–1.0)
Monocytes Relative: 49 %
Neutro Abs: 0.4 10*3/uL — CL (ref 1.7–7.7)
Neutrophils Relative %: 5 %
Platelets: 18 10*3/uL — CL (ref 150–400)
RBC: 1.86 MIL/uL — ABNORMAL LOW (ref 3.87–5.11)
RDW: 18 % — ABNORMAL HIGH (ref 11.5–15.5)
WBC: 7.5 10*3/uL (ref 4.0–10.5)
nRBC: 0 % (ref 0.0–0.2)

## 2023-02-08 LAB — COMPREHENSIVE METABOLIC PANEL
ALT: 9 U/L (ref 0–44)
AST: 13 U/L — ABNORMAL LOW (ref 15–41)
Albumin: 2.6 g/dL — ABNORMAL LOW (ref 3.5–5.0)
Alkaline Phosphatase: 37 U/L — ABNORMAL LOW (ref 38–126)
Anion gap: 9 (ref 5–15)
BUN: 25 mg/dL — ABNORMAL HIGH (ref 8–23)
CO2: 25 mmol/L (ref 22–32)
Calcium: 6 mg/dL — CL (ref 8.9–10.3)
Chloride: 105 mmol/L (ref 98–111)
Creatinine, Ser: 6.56 mg/dL — ABNORMAL HIGH (ref 0.44–1.00)
GFR, Estimated: 7 mL/min — ABNORMAL LOW (ref >60)
Glucose, Bld: 82 mg/dL (ref 70–99)
Potassium: 2.6 mmol/L — CL (ref 3.5–5.1)
Sodium: 139 mmol/L (ref 135–145)
Total Bilirubin: 0.9 mg/dL (ref <1.2)
Total Protein: 3.9 g/dL — ABNORMAL LOW (ref 6.5–8.1)

## 2023-02-08 LAB — I-STAT CHEM 8, ED
BUN: 24 mg/dL — ABNORMAL HIGH (ref 8–23)
Calcium, Ion: 0.76 mmol/L — CL (ref 1.15–1.40)
Chloride: 100 mmol/L (ref 98–111)
Creatinine, Ser: 7.7 mg/dL — ABNORMAL HIGH (ref 0.44–1.00)
Glucose, Bld: 106 mg/dL — ABNORMAL HIGH (ref 70–99)
HCT: 17 % — ABNORMAL LOW (ref 36.0–46.0)
Hemoglobin: 5.8 g/dL — CL (ref 12.0–15.0)
Potassium: 3.1 mmol/L — ABNORMAL LOW (ref 3.5–5.1)
Sodium: 139 mmol/L (ref 135–145)
TCO2: 26 mmol/L (ref 22–32)

## 2023-02-08 LAB — TROPONIN I (HIGH SENSITIVITY)
Troponin I (High Sensitivity): 17 ng/L (ref <18)
Troponin I (High Sensitivity): 21 ng/L — ABNORMAL HIGH (ref <18)

## 2023-02-08 LAB — PREPARE RBC (CROSSMATCH)

## 2023-02-08 LAB — LACTIC ACID, PLASMA
Lactic Acid, Venous: 1.1 mmol/L (ref 0.5–1.9)
Lactic Acid, Venous: 1 mmol/L (ref 0.5–1.9)

## 2023-02-08 LAB — ABO/RH: ABO/RH(D): O POS

## 2023-02-08 MED ORDER — POTASSIUM CHLORIDE 10 MEQ/100ML IV SOLN
10.0000 meq | Freq: Once | INTRAVENOUS | Status: AC
Start: 2023-02-08 — End: 2023-02-08
  Administered 2023-02-08: 10 meq via INTRAVENOUS
  Filled 2023-02-08: qty 100

## 2023-02-08 MED ORDER — SODIUM CHLORIDE 0.9% IV SOLUTION: Freq: Once | INTRAVENOUS | Status: AC

## 2023-02-08 MED ORDER — LACTATED RINGERS IV BOLUS: 1000.0000 mL | Freq: Once | INTRAVENOUS | Status: DC

## 2023-02-08 MED ORDER — MAGNESIUM OXIDE -MG SUPPLEMENT 400 (240 MG) MG PO TABS
800.0000 mg | ORAL_TABLET | Freq: Once | ORAL | Status: AC
Start: 2023-02-08 — End: 2023-02-08
  Administered 2023-02-08: 800 mg via ORAL
  Filled 2023-02-08: qty 2

## 2023-02-08 MED ORDER — POTASSIUM CHLORIDE CRYS ER 20 MEQ PO TBCR
40.0000 meq | EXTENDED_RELEASE_TABLET | Freq: Once | ORAL | Status: AC
  Administered 2023-02-08: 40 meq via ORAL
  Filled 2023-02-08: qty 2

## 2023-02-08 MED ORDER — NALOXONE HCL 2 MG/2ML IJ SOSY
1.0000 mg | PREFILLED_SYRINGE | Freq: Once | INTRAMUSCULAR | Status: AC
  Administered 2023-02-08: 1 mg via INTRAVENOUS
  Filled 2023-02-08: qty 2

## 2023-02-08 MED ORDER — SODIUM CHLORIDE 0.9 % IV SOLN: 250.0000 mL | INTRAVENOUS | Status: DC

## 2023-02-08 MED ORDER — ALBUMIN HUMAN 25 % IV SOLN
25.0000 g | Freq: Once | INTRAVENOUS | Status: AC
Start: 2023-02-08 — End: 2023-02-08
  Administered 2023-02-08: 25 g via INTRAVENOUS
  Filled 2023-02-08: qty 100

## 2023-02-08 MED ORDER — CALCIUM GLUCONATE-NACL 1-0.675 GM/50ML-% IV SOLN
1.0000 g | Freq: Once | INTRAVENOUS | Status: AC
  Administered 2023-02-08: 1000 mg via INTRAVENOUS
  Filled 2023-02-08: qty 50

## 2023-02-08 MED ORDER — NOREPINEPHRINE 4 MG/250ML-% IV SOLN
2.0000 ug/min | INTRAVENOUS | Status: DC
  Administered 2023-02-08: 3 ug/min via INTRAVENOUS
  Filled 2023-02-08: qty 250

## 2023-02-08 MED ORDER — LACTATED RINGERS IV BOLUS
1000.0000 mL | Freq: Once | INTRAVENOUS | Status: AC
  Administered 2023-02-08: 1000 mL via INTRAVENOUS

## 2023-02-08 NOTE — ED Notes (Signed)
Tracey Stevens called from [facility]. Pt can admit to bed number [facility] 8th floor 8CCW 810. Report can be called to [phone]. Charge nurse can be reached at [phone].

## 2023-02-08 NOTE — ED Notes (Signed)
Pt gave verbal consent to have emergency blood transfused, will have pt sign blood consent form as well.

## 2023-02-08 NOTE — ED Notes (Signed)
Secretary notified to set up transport for pt to [facility].

## 2023-02-08 NOTE — ED Provider Notes (Signed)
[place] EMERGENCY DEPARTMENT AT [facility] Provider Note  CSN: [id number] Arrival date & time: 02/08/23 1835  Chief Complaint(s) Hypotension  HPI Tracey Stevens is a 62 y.o. 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  Patient recently admitted for hypoxic respiratory failure secondary to rhinovirus infection on 02/01/2023 and discharged back to her skilled nursing facility.  Skilled nursing facility called out for for lethargy for the last 2 hours.  Patient found to be hypotensive 70/50 and minimally responsive for EMS.  On arrival, patient will arouse to noxious stimuli but quickly falls back asleep and remains somnolent.  Denies chest pain, shortness of breath, abdominal pain, nausea, vomiting or other systemic symptoms.   Past Medical History Past Medical History:  Diagnosis Date   Acute lymphoblastic leukemia (ALL) (HCC)    Adrenal nodule (HCC)    Anemia    Cancer (HCC)    cervical   Congestive heart failure (CHF) (HCC)    Diffuse Large B Cell Lymphoma 2015 remission   Dyspnea    SOB when she over exerts herself   ESRD on dialysis [facility])    M-W-F   Hearing loss    History of blood transfusion    Hyperparathyroidism (HCC)    s/p parathyroidectomy   Hypertension    Hypertension    Multinodular goiter    Multiple renal cysts    Nephrolithiasis    On antineoplastic chemotherapy    Pre-diabetes    Most recent A1C 6.7- 09/13/20. No blood sugar checks   Vertigo    Vitamin D deficiency    Patient Active Problem List   Diagnosis Date Noted   Collapsed vertebra, not elsewhere classified, site unspecified, subsequent encounter for fracture with routine healing 05/12/2021   Colon cancer screening 05/12/2021   Disorder of adrenal gland, unspecified (HCC) 05/12/2021   Hardening of the aorta (main artery of the heart) (HCC)  05/12/2021   History of primary hyperparathyroidism 05/12/2021   Obesity 05/12/2021   Bilateral foot pain 04/15/2021   Febrile neutropenia (HCC) 03/21/2021   Epiglottitis 03/21/2021   Sepsis (HCC) 03/21/2021   Conductive hearing loss, unilateral, left ear, 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 12/23/2020   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   ESRD (end stage renal disease) (HCC) 12/20/2020   ALL (acute lymphoblastic leukemia) (HCC) 12/12/2020   AKI (acute kidney injury) (HCC) 12/12/2020   SIRS (systemic inflammatory response syndrome) (HCC) 12/12/2020   Bilateral lower extremity edema 10/10/2020   Elevated glucose 10/09/2020   Peripheral edema 10/09/2020   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   Lymphocytosis 09/11/2020   CKD (chronic kidney disease) stage 5, GFR less than 15 ml/min (HCC) 09/11/2020   Anemia 09/11/2020   Dysuria 09/11/2020   Diabetes mellitus without complication (HCC) 08/30/2020   Hypercalcemia 06/25/2020   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 Authorizing Provider  acyclovir (  ZOVIRAX) 400 MG tablet Take 1 tablet (400 mg) by mouth once daily after 5 pm. Take while neutropenic as directed by oncologist. 11/20/21   [provider]  atorvastatin (LIPITOR) 20 MG tablet Take 20 mg by mouth daily. 04/01/21   [provider]  benzonatate (TESSALON) 100 MG capsule Take 1 capsule by mouth 3 (three) times daily as needed for cough.  07/24/21   [provider]  cinacalcet (SENSIPAR) 30 MG tablet Take 30 mg by mouth daily.    [provider]  dapsone 100 MG tablet Take 100 mg by mouth daily.    [provider]  diclofenac Sodium (VOLTAREN) 1 % GEL Apply 2 g topically daily as needed (pain).    [provider]  docusate sodium (COLACE) 100 MG capsule Take 1 capsule (100 mg total) by mouth 2 (two) times daily as needed for mild constipation. Patient not taking: Reported on 06/03/2021 03/22/21   Lorin Glass, MD  gabapentin (NEURONTIN) 300 MG capsule Take 300 mg by mouth at bedtime. 05/23/21   [provider]  labetalol (NORMODYNE) 5 MG/ML injection Inject 2 mLs (10 mg total) into the vein every 2 (two) hours as needed (SBP > 180 or DBP > 100; HR must be > 70 to administer). Patient not taking: Reported on 06/03/2021 03/22/21   Lorin Glass, MD  lidocaine, PF, (XYLOCAINE) 1 % SOLN injection Inject 5 mLs into the skin as needed (topical anesthesia for hemodialysis if GEBAUERS is ineffective.). Patient not taking: Reported on 06/03/2021 03/22/21   Lorin Glass, MD  lidocaine-prilocaine (EMLA) cream Apply 1 application topically as needed (topical anesthesia for hemodialysis if Gebauers and Lidocaine injection are ineffective.). Patient not taking: Reported on 06/03/2021 03/22/21   Lorin Glass, MD  magic mouthwash w/lidocaine SOLN Take 5 mLs by mouth 3 (three) times daily as needed for mouth pain. Patient not taking: Reported on 06/03/2021 03/22/21   Lorin Glass, MD  metoprolol succinate (TOPROL-XL) 50 MG 24 hr tablet Take 50 mg by mouth 2 (two) times daily. 05/14/22 05/14/23  [provider]  Mouthwashes (MOUTH RINSE) LIQD solution 15 mLs by Mouth Rinse route 2 (two) times daily. Patient not taking: Reported on 06/03/2021 03/22/21   Lorin Glass, MD  ondansetron [facility] 4 MG/2ML SOLN injection Inject 2 mLs (4 mg total) into the vein every 8 (eight) hours as needed for nausea or  vomiting. Patient not taking: Reported on 06/03/2021 03/22/21   Lorin Glass, MD  pantoprazole (PROTONIX) 40 MG injection Inject 40 mg into the vein daily. Patient not taking: Reported on 06/03/2021 03/23/21   Lorin Glass, MD  pentafluoroprop-tetrafluoroeth Peggye Pitt) AERO Apply 1 application topically as needed (topical anesthesia for hemodialysis). Patient not taking: Reported on 06/03/2021 03/22/21   Lorin Glass, MD  polyethylene glycol (MIRALAX / GLYCOLAX) 17 g packet Take 17 g by mouth daily as needed for moderate constipation. Patient not taking: Reported on 06/03/2021 03/22/21   Lorin Glass, MD  Tbo-Filgrastim [facility]) 480 MCG/0.8ML SOSY injection Inject 0.8 mLs (480 mcg total) into the skin daily at 6 (six) AM. Patient not taking: Reported on 06/03/2021 03/22/21   Lorin Glass, MD  traMADol (ULTRAM) 50 MG tablet Take 50 mg by mouth 3 (three) times daily as needed for severe pain.    [provider]  Past Surgical History Past Surgical History:  Procedure Laterality Date   ABDOMINAL HYSTERECTOMY     BREAST BIOPSY Right over 10 years ago   benign   Left ear surgery     LIVER BIOPSY  2014   confirmed to be DLBCL   PARATHYROIDECTOMY     TUBAL LIGATION     Family History Family History  Problem Relation Age of Onset   Heart disease Mother    Hypertension Mother    Gastric cancer Father    Renal Disease Father    Cancer Maternal Grandmother    Leukemia Maternal Grandfather    Cancer Paternal Grandmother    Cancer Paternal Grandfather     Social History Social History   Tobacco Use   Smoking status: Never   Smokeless tobacco: Never  Vaping Use   Vaping status: Never Used  Substance Use Topics   Alcohol use: Yes    Comment: occ   Drug use: No   Allergies Other, Amlodipine, and Methotrexate  Review of Systems Review of  Systems  Constitutional:  Positive for fatigue.    Physical Exam Vital Signs  I have reviewed the triage vital signs BP (!) 100/57   Pulse 99   Temp 98 F (36.7 C) (Oral)   Resp 19   SpO2 99%   Physical Exam Vitals and nursing note reviewed.  Constitutional:      General: She is not in acute distress.    Appearance: She is well-developed. She is ill-appearing.  HENT:     Head: Normocephalic and atraumatic.  Eyes:     Conjunctiva/sclera: Conjunctivae normal.  Cardiovascular:     Rate and Rhythm: Normal rate and regular rhythm.     Heart sounds: No murmur heard. Pulmonary:     Effort: Pulmonary effort is normal. No respiratory distress.     Breath sounds: Normal breath sounds.  Abdominal:     Palpations: Abdomen is soft.     Tenderness: There is no abdominal tenderness.  Musculoskeletal:        General: No swelling.     Cervical back: Neck supple.  Skin:    General: Skin is warm and dry.     Capillary Refill: Capillary refill takes less than 2 seconds.  Neurological:     Mental Status: She is alert.  Psychiatric:        Mood and Affect: Mood normal.     ED Results and Treatments Labs (all labs ordered are listed, but only abnormal results are displayed) Labs Reviewed  COMPREHENSIVE METABOLIC PANEL - Abnormal; Notable for the following components:      Result Value   Potassium 2.6 (*)    BUN 25 (*)    Creatinine, Ser 6.56 (*)    Calcium 6.0 (*)    Total Protein 3.9 (*)    Albumin 2.6 (*)    AST 13 (*)    Alkaline Phosphatase 37 (*)    GFR, Estimated 7 (*)    All other components within normal limits  CBC WITH DIFFERENTIAL/PLATELET - Abnormal; Notable for the following components:   RBC 1.86 (*)    Hemoglobin 5.6 (*)    HCT 17.8 (*)    RDW 18.0 (*)    Platelets 18 (*)    Neutro Abs 0.4 (*)    Monocytes Absolute 3.7 (*)    All other components within normal limits  I-STAT CHEM 8, ED - Abnormal; Notable for the following components:   Potassium 3.1 (*)  BUN 24 (*)    Creatinine, Ser 7.70 (*)    Glucose, Bld 106 (*)    Calcium, Ion 0.76 (*)    Hemoglobin 5.8 (*)    HCT 17.0 (*)    All other components within normal limits  CULTURE, BLOOD (ROUTINE X 2)  CULTURE, BLOOD (ROUTINE X 2)  LACTIC ACID, PLASMA  LACTIC ACID, PLASMA  PATHOLOGIST SMEAR REVIEW  TYPE AND SCREEN  ABO/RH  PREPARE RBC (CROSSMATCH)  PREPARE PLATELET PHERESIS  TROPONIN I (HIGH SENSITIVITY)  TROPONIN I (HIGH SENSITIVITY)                                                                                                                          Radiology DG Chest Portable 1 View  Result Date: 02/08/2023 CLINICAL DATA:  Dyspnea EXAM: PORTABLE CHEST 1 VIEW COMPARISON:  02/01/2023 FINDINGS: Lung volumes are small with stable mild left-sided relative volume loss. No superimposed focal pulmonary infiltrate. No pneumothorax or pleural effusion. Right internal jugular chest port and left internal jugular hemodialysis catheter with their tips within the right atrium are unchanged. Cardiac size is within normal limits on this rotated examination. Pulmonary vascularity is normal. No acute bone abnormality. IMPRESSION: 1. Pulmonary hypoinflation. Electronically Signed   By: Helyn Numbers M.D.   On: 02/08/2023 20:48    Pertinent labs & imaging results that were available during my care of the patient were reviewed by me and considered in my medical decision making (see MDM for details).  Medications Ordered in ED Medications  norepinephrine (LEVOPHED) 4mg  in (0.016 mg/mL) premix infusion (5 mcg/min Intravenous Rate/Dose Change 02/08/23 2007)  potassium chloride 10 mEq in 100 mL IVPB (10 mEq Intravenous New Bag/Given 02/08/23 2024)  lactated ringers bolus 1,000 mL (has no administration in time range)  0.9 %  sodium chloride infusion (Manually program via Guardrails IV Fluids) (has no administration in time range)  lactated ringers bolus 1,000 mL (1,000 mLs Intravenous New  Bag/Given 02/08/23 1916)  naloxone [facility]) injection 1 mg (1 mg Intravenous Given 02/08/23 1916)  albumin human 25 % solution 25 g (25 g Intravenous New Bag/Given 02/08/23 2037)  potassium chloride SA (KLOR-CON M) CR tablet 40 mEq (40 mEq Oral Given 02/08/23 2025)  magnesium oxide (MAG-OX) tablet 800 mg (800 mg Oral Given 02/08/23 2025)  0.9 %  sodium chloride infusion (Manually program via Guardrails IV Fluids) ( Intravenous New Bag/Given 02/08/23 2050)  Procedures .Critical Care  Performed by: Glendora Score, MD Authorized by: Glendora Score, MD   Critical care provider statement:    Critical care time (minutes):  75   Critical care was necessary to treat or prevent imminent or life-threatening deterioration of the following conditions:  Shock   Critical care was time spent personally by me on the following activities:  Development of treatment plan with patient or surrogate, discussions with consultants, evaluation of patient's response to treatment, examination of patient, ordering and review of laboratory studies, ordering and review of radiographic studies, ordering and performing treatments and interventions, pulse oximetry, re-evaluation of patient's condition and review of old charts   (including critical care time)  Medical Decision Making / ED Course   This patient presents to the ED for concern of hypotension, lethargy, this involves an extensive number of treatment options, and is a complaint that carries with it a high risk of complications and morbidity.  The differential diagnosis includes bacteremia, sepsis, anemia, hypovolemia, obstructive shock, medication side effect, bone marrow suppression  MDM: Patient seen in the emergency room for evaluation of hypotension and lethargy.  Physical exam reveals a somnolent patient will fall asleep on  examination but is arousable to noxious stimuli.  Port in place in the right upper chest, trialysis line in left upper chest but physical exam otherwise unremarkable.  Prior to return of laboratory evaluation, patient persistently hypotensive with maps in the low 50s and systolics in the 70s nonresponsive to fluids.  Albumin added and patient started on low-dose peripheral Levophed with improvement of blood pressures.  Laboratory evaluation then resulted showing a new significant anemia to 5.6 with a new thrombocytopenia to 18 and a neutropenia to 0.4.  New hypokalemia to 2.6 which was aggressively repleted in the emergency department as well as hypocalcemia which was also repleted.  Packed red blood cells ordered for the patient and pressures have stabilized.  Her mental status is also significant improved.  Chest x-ray is unremarkable.  Lactic acid is normal.  With no white count, normal lactic acid, no fever, I have overall lower suspicion that this is infectious in nature but blood cultures were obtained.  Broad-spectrum antibiotics were thus not given for isolated hypotension with otherwise negative infectious workup.  I spoke with the oncologist on-call at [facility] Dr. Allena Katz who is excepted the patient in transfer for ICU admission at [facility] as the patient is a primary B-ALL patient with new apparent bone marrow suppression and [place] unfortunately does not manage acute leukemias.  Patient then transferred [facility]   Additional history obtained:  -External records from outside source obtained and reviewed including: Chart review including previous notes, labs, imaging, consultation notes   Lab Tests: -I ordered, reviewed, and interpreted labs.   The pertinent results include:   Labs Reviewed  COMPREHENSIVE METABOLIC PANEL - Abnormal; Notable for the following components:      Result Value   Potassium 2.6 (*)    BUN 25 (*)    Creatinine, Ser 6.56 (*)     Calcium 6.0 (*)    Total Protein 3.9 (*)    Albumin 2.6 (*)    AST 13 (*)    Alkaline Phosphatase 37 (*)    GFR, Estimated 7 (*)    All other components within normal limits  CBC WITH DIFFERENTIAL/PLATELET - Abnormal; Notable for the following components:   RBC 1.86 (*)    Hemoglobin 5.6 (*)    HCT 17.8 (*)  RDW 18.0 (*)    Platelets 18 (*)    Neutro Abs 0.4 (*)    Monocytes Absolute 3.7 (*)    All other components within normal limits  I-STAT CHEM 8, ED - Abnormal; Notable for the following components:   Potassium 3.1 (*)    BUN 24 (*)    Creatinine, Ser 7.70 (*)    Glucose, Bld 106 (*)    Calcium, Ion 0.76 (*)    Hemoglobin 5.8 (*)    HCT 17.0 (*)    All other components within normal limits  CULTURE, BLOOD (ROUTINE X 2)  CULTURE, BLOOD (ROUTINE X 2)  LACTIC ACID, PLASMA  LACTIC ACID, PLASMA  PATHOLOGIST SMEAR REVIEW  TYPE AND SCREEN  ABO/RH  PREPARE RBC (CROSSMATCH)  PREPARE PLATELET PHERESIS  TROPONIN I (HIGH SENSITIVITY)  TROPONIN I (HIGH SENSITIVITY)      EKG   EKG Interpretation Date/Time:  Monday February 08 2023 19:00:31 EST Ventricular Rate:  107 PR Interval:  147 QRS Duration:  125 QT Interval:  401 QTC Calculation: 536 R Axis:   -62  Text Interpretation: Sinus tachycardia Nonspecific IVCD with LAD Probable lateral infarct, old Confirmed by Consuello Lassalle ([id number]) on 02/08/2023 11:52:48 PM         Imaging Studies ordered: I ordered imaging studies including chest x-ray I independently visualized and interpreted imaging. I agree with the radiologist interpretation   Medicines ordered and prescription drug management: Meds ordered this encounter  Medications   lactated ringers bolus 1,000 mL   naloxone (NARCAN) injection 1 mg   DISCONTD: 0.9 %  sodium chloride infusion   norepinephrine (LEVOPHED) 4mg  in (0.016 mg/mL) premix infusion    Order Specific Question:   IV Access    Answer:   Peripheral   albumin human 25 % solution 25 g    potassium chloride SA (KLOR-CON M) CR tablet 40 mEq   magnesium oxide (MAG-OX) tablet 800 mg   potassium chloride 10 mEq in 100 mL IVPB   lactated ringers bolus 1,000 mL   0.9 %  sodium chloride infusion (Manually program via Guardrails IV Fluids)   0.9 %  sodium chloride infusion (Manually program via Guardrails IV Fluids)    -I have reviewed the patients home medicines and have made adjustments as needed  Critical interventions Peripheral pressors, blood transfusion, electrolyte repletion  Consultations Obtained: I requested consultation with the [facility] oncologist on-call Dr. Allena Katz,  and discussed lab and imaging findings as well as pertinent plan - they recommend: Admission in the ICU at [facility]   Cardiac Monitoring: The patient was maintained on a cardiac monitor.  I personally viewed and interpreted the cardiac monitored which showed an underlying rhythm of: NSR  Social Determinants of Health:  Factors impacting patients care include: none   Reevaluation: After the interventions noted above, I reevaluated the patient and found that they have :improved  Co morbidities that complicate the patient evaluation  Past Medical History:  Diagnosis Date   Acute lymphoblastic leukemia (ALL) (HCC)    Adrenal nodule (HCC)    Anemia    Cancer (HCC)    cervical   Congestive heart failure (CHF) (HCC)    Diffuse Large B Cell Lymphoma 2015 remission   Dyspnea    SOB when she over exerts herself   ESRD on dialysis [facility])    M-W-F   Hearing loss    History of blood transfusion    Hyperparathyroidism (HCC)    s/p parathyroidectomy  Hypertension    Hypertension    Multinodular goiter    Multiple renal cysts    Nephrolithiasis    On antineoplastic chemotherapy    Pre-diabetes    Most recent A1C 6.7- 09/13/20. No blood sugar checks   Vertigo    Vitamin D deficiency       Dispostion: I considered admission for this patient, and patient will be admitted at [facility] in the oncologic ICU.     Final Clinical Impression(s) / ED Diagnoses Final diagnoses:  Hypotension, unspecified hypotension type     @PCDICTATION @    Glendora Score, MD 02/08/23 2354

## 2023-02-08 NOTE — ED Triage Notes (Signed)
Patient from [facility] for eval of lethargy x 2 hours. Initial BP with EMS 70/50. Patient has no complaints. MWTh dialysis but had dialysis yesterday.

## 2023-02-09 DIAGNOSIS — Z992 Dependence on renal dialysis: Secondary | ICD-10-CM | POA: Diagnosis not present

## 2023-02-09 DIAGNOSIS — Z8541 Personal history of malignant neoplasm of cervix uteri: Secondary | ICD-10-CM | POA: Diagnosis not present

## 2023-02-09 DIAGNOSIS — I509 Heart failure, unspecified: Secondary | ICD-10-CM | POA: Diagnosis not present

## 2023-02-09 DIAGNOSIS — Z79899 Other long term (current) drug therapy: Secondary | ICD-10-CM | POA: Diagnosis not present

## 2023-02-09 DIAGNOSIS — R5383 Other fatigue: Secondary | ICD-10-CM | POA: Diagnosis present

## 2023-02-09 DIAGNOSIS — D649 Anemia, unspecified: Secondary | ICD-10-CM | POA: Diagnosis not present

## 2023-02-09 DIAGNOSIS — N186 End stage renal disease: Secondary | ICD-10-CM | POA: Diagnosis not present

## 2023-02-09 DIAGNOSIS — I132 Hypertensive heart and chronic kidney disease with heart failure and with stage 5 chronic kidney disease, or end stage renal disease: Secondary | ICD-10-CM | POA: Diagnosis not present

## 2023-02-09 DIAGNOSIS — I959 Hypotension, unspecified: Secondary | ICD-10-CM | POA: Diagnosis not present

## 2023-02-09 DIAGNOSIS — E1122 Type 2 diabetes mellitus with diabetic chronic kidney disease: Secondary | ICD-10-CM | POA: Diagnosis not present

## 2023-02-09 LAB — TYPE AND SCREEN
ABO/RH(D): O POS
Antibody Screen: NEGATIVE
Unit division: 0

## 2023-02-09 LAB — BPAM RBC
Blood Product Expiration Date: 202412142359
ISSUE DATE / TIME: 202411252026
Unit Type and Rh: 5100

## 2023-02-09 LAB — BPAM PLATELET PHERESIS
Blood Product Expiration Date: 202411272359
ISSUE DATE / TIME: 202411252208
Unit Type and Rh: 6200

## 2023-02-09 LAB — PREPARE PLATELET PHERESIS: Unit division: 0

## 2023-02-09 LAB — PATHOLOGIST SMEAR REVIEW

## 2023-02-09 NOTE — ED Notes (Signed)
Report received from Oda Kilts. RN. Assumed care of pt at this time.

## 2023-02-13 LAB — CULTURE, BLOOD (ROUTINE X 2)
Culture: NO GROWTH
Culture: NO GROWTH
Special Requests: ADEQUATE
